# Patient Record
Sex: Male | Born: 1935 | Race: White | Hispanic: No | Marital: Married | Smoking: Former smoker
Health system: Southern US, Community
[De-identification: ages and names within clinical notes are randomized; demographics above are authoritative.]

## PROBLEM LIST (undated history)

## (undated) DIAGNOSIS — Z8739 Personal history of other diseases of the musculoskeletal system and connective tissue: Secondary | ICD-10-CM

## (undated) DIAGNOSIS — E7800 Pure hypercholesterolemia, unspecified: Secondary | ICD-10-CM

## (undated) DIAGNOSIS — M479 Spondylosis, unspecified: Secondary | ICD-10-CM

## (undated) DIAGNOSIS — I739 Peripheral vascular disease, unspecified: Secondary | ICD-10-CM

## (undated) DIAGNOSIS — Z8719 Personal history of other diseases of the digestive system: Secondary | ICD-10-CM

## (undated) DIAGNOSIS — N529 Male erectile dysfunction, unspecified: Secondary | ICD-10-CM

## (undated) DIAGNOSIS — G4730 Sleep apnea, unspecified: Secondary | ICD-10-CM

## (undated) DIAGNOSIS — E1121 Type 2 diabetes mellitus with diabetic nephropathy: Secondary | ICD-10-CM

## (undated) DIAGNOSIS — N183 Chronic kidney disease, stage 3 unspecified: Secondary | ICD-10-CM

## (undated) DIAGNOSIS — M1990 Unspecified osteoarthritis, unspecified site: Secondary | ICD-10-CM

## (undated) DIAGNOSIS — J189 Pneumonia, unspecified organism: Secondary | ICD-10-CM

## (undated) DIAGNOSIS — E119 Type 2 diabetes mellitus without complications: Secondary | ICD-10-CM

## (undated) DIAGNOSIS — I350 Nonrheumatic aortic (valve) stenosis: Secondary | ICD-10-CM

## (undated) DIAGNOSIS — H409 Unspecified glaucoma: Secondary | ICD-10-CM

## (undated) DIAGNOSIS — K635 Polyp of colon: Secondary | ICD-10-CM

## (undated) DIAGNOSIS — Z952 Presence of prosthetic heart valve: Secondary | ICD-10-CM

## (undated) DIAGNOSIS — S301XXA Contusion of abdominal wall, initial encounter: Secondary | ICD-10-CM

## (undated) DIAGNOSIS — K219 Gastro-esophageal reflux disease without esophagitis: Secondary | ICD-10-CM

## (undated) DIAGNOSIS — K8020 Calculus of gallbladder without cholecystitis without obstruction: Secondary | ICD-10-CM

## (undated) DIAGNOSIS — K5790 Diverticulosis of intestine, part unspecified, without perforation or abscess without bleeding: Secondary | ICD-10-CM

## (undated) DIAGNOSIS — I10 Essential (primary) hypertension: Secondary | ICD-10-CM

## (undated) DIAGNOSIS — K449 Diaphragmatic hernia without obstruction or gangrene: Secondary | ICD-10-CM

## (undated) DIAGNOSIS — G4733 Obstructive sleep apnea (adult) (pediatric): Secondary | ICD-10-CM

## (undated) DIAGNOSIS — Z87440 Personal history of urinary (tract) infections: Secondary | ICD-10-CM

## (undated) DIAGNOSIS — N400 Enlarged prostate without lower urinary tract symptoms: Secondary | ICD-10-CM

## (undated) HISTORY — DX: Polyp of colon: K63.5

## (undated) HISTORY — DX: Personal history of other diseases of the musculoskeletal system and connective tissue: Z87.39

## (undated) HISTORY — DX: Gastro-esophageal reflux disease without esophagitis: K21.9

## (undated) HISTORY — DX: Personal history of urinary (tract) infections: Z87.440

## (undated) HISTORY — PX: TOTAL KNEE ARTHROPLASTY: SHX125

## (undated) HISTORY — DX: Pure hypercholesterolemia, unspecified: E78.00

## (undated) HISTORY — DX: Personal history of other diseases of the digestive system: Z87.19

## (undated) HISTORY — DX: Type 2 diabetes mellitus without complications: E11.9

## (undated) HISTORY — DX: Essential (primary) hypertension: I10

## (undated) HISTORY — PX: APPENDECTOMY: SHX54

## (undated) HISTORY — DX: Diverticulosis of intestine, part unspecified, without perforation or abscess without bleeding: K57.90

## (undated) HISTORY — PX: FEMORAL-POPLITEAL BYPASS GRAFT: SHX937

## (undated) HISTORY — DX: Unspecified glaucoma: H40.9

## (undated) HISTORY — DX: Pneumonia, unspecified organism: J18.9

## (undated) HISTORY — DX: Type 2 diabetes mellitus with diabetic nephropathy: E11.21

## (undated) HISTORY — DX: Unspecified osteoarthritis, unspecified site: M19.90

## (undated) HISTORY — DX: Diaphragmatic hernia without obstruction or gangrene: K44.9

## (undated) HISTORY — DX: Nonrheumatic aortic (valve) stenosis: I35.0

## (undated) HISTORY — DX: Male erectile dysfunction, unspecified: N52.9

## (undated) HISTORY — DX: Spondylosis, unspecified: M47.9

## (undated) HISTORY — PX: VASECTOMY: SHX75

## (undated) HISTORY — DX: Benign prostatic hyperplasia without lower urinary tract symptoms: N40.0

## (undated) HISTORY — DX: Peripheral vascular disease, unspecified: I73.9

## (undated) HISTORY — DX: Obstructive sleep apnea (adult) (pediatric): G47.33

---

## 1993-06-14 HISTORY — PX: ABDOMINAL AORTIC ANEURYSM REPAIR: SUR1152

## 2000-05-02 ENCOUNTER — Ambulatory Visit (HOSPITAL_COMMUNITY): Admission: RE | Admit: 2000-05-02 | Discharge: 2000-05-02 | Payer: Self-pay | Admitting: Internal Medicine

## 2001-01-09 ENCOUNTER — Encounter: Admission: RE | Admit: 2001-01-09 | Discharge: 2001-01-09 | Payer: Self-pay | Admitting: Plastic Surgery

## 2001-01-09 ENCOUNTER — Encounter: Payer: Self-pay | Admitting: Rheumatology

## 2001-01-26 ENCOUNTER — Encounter: Admission: RE | Admit: 2001-01-26 | Discharge: 2001-01-26 | Payer: Self-pay | Admitting: Rheumatology

## 2001-01-26 ENCOUNTER — Encounter: Payer: Self-pay | Admitting: Rheumatology

## 2001-03-14 HISTORY — PX: LUMBAR SPINE SURGERY: SHX701

## 2001-04-10 ENCOUNTER — Encounter: Payer: Self-pay | Admitting: Neurosurgery

## 2001-04-12 ENCOUNTER — Encounter: Payer: Self-pay | Admitting: Neurosurgery

## 2001-04-12 ENCOUNTER — Inpatient Hospital Stay (HOSPITAL_COMMUNITY): Admission: RE | Admit: 2001-04-12 | Discharge: 2001-04-13 | Payer: Self-pay | Admitting: Neurosurgery

## 2001-06-14 HISTORY — PX: CATARACT EXTRACTION: SUR2

## 2002-06-14 HISTORY — PX: REPLACEMENT TOTAL KNEE: SUR1224

## 2002-11-16 ENCOUNTER — Ambulatory Visit (HOSPITAL_COMMUNITY): Admission: RE | Admit: 2002-11-16 | Discharge: 2002-11-16 | Payer: Self-pay | Admitting: Gastroenterology

## 2003-03-26 ENCOUNTER — Encounter: Payer: Self-pay | Admitting: Orthopedic Surgery

## 2003-04-01 ENCOUNTER — Inpatient Hospital Stay (HOSPITAL_COMMUNITY): Admission: RE | Admit: 2003-04-01 | Discharge: 2003-04-06 | Payer: Self-pay | Admitting: Orthopedic Surgery

## 2006-11-09 ENCOUNTER — Encounter: Admission: RE | Admit: 2006-11-09 | Discharge: 2006-11-09 | Payer: Self-pay | Admitting: Internal Medicine

## 2006-12-02 ENCOUNTER — Encounter: Admission: RE | Admit: 2006-12-02 | Discharge: 2006-12-02 | Payer: Self-pay | Admitting: Internal Medicine

## 2007-01-17 ENCOUNTER — Encounter: Admission: RE | Admit: 2007-01-17 | Discharge: 2007-01-17 | Payer: Self-pay | Admitting: Internal Medicine

## 2007-12-29 ENCOUNTER — Ambulatory Visit: Payer: Self-pay | Admitting: Vascular Surgery

## 2008-05-03 ENCOUNTER — Ambulatory Visit: Payer: Self-pay | Admitting: Vascular Surgery

## 2008-08-05 ENCOUNTER — Inpatient Hospital Stay (HOSPITAL_COMMUNITY): Admission: RE | Admit: 2008-08-05 | Discharge: 2008-08-12 | Payer: Self-pay | Admitting: Orthopedic Surgery

## 2008-11-08 ENCOUNTER — Encounter: Admission: RE | Admit: 2008-11-08 | Discharge: 2008-11-08 | Payer: Self-pay | Admitting: Neurological Surgery

## 2008-12-27 ENCOUNTER — Ambulatory Visit: Payer: Self-pay | Admitting: Vascular Surgery

## 2009-06-19 ENCOUNTER — Ambulatory Visit: Payer: Self-pay | Admitting: Vascular Surgery

## 2010-06-19 ENCOUNTER — Ambulatory Visit: Admit: 2010-06-19 | Payer: Self-pay | Admitting: Vascular Surgery

## 2010-07-28 ENCOUNTER — Encounter (INDEPENDENT_AMBULATORY_CARE_PROVIDER_SITE_OTHER): Payer: MEDICARE

## 2010-07-28 ENCOUNTER — Encounter (INDEPENDENT_AMBULATORY_CARE_PROVIDER_SITE_OTHER): Payer: MEDICARE | Admitting: Vascular Surgery

## 2010-07-28 DIAGNOSIS — I739 Peripheral vascular disease, unspecified: Secondary | ICD-10-CM

## 2010-07-28 DIAGNOSIS — Z48812 Encounter for surgical aftercare following surgery on the circulatory system: Secondary | ICD-10-CM

## 2010-07-28 DIAGNOSIS — I714 Abdominal aortic aneurysm, without rupture: Secondary | ICD-10-CM

## 2010-07-28 DIAGNOSIS — I70219 Atherosclerosis of native arteries of extremities with intermittent claudication, unspecified extremity: Secondary | ICD-10-CM

## 2010-07-29 NOTE — Consult Note (Signed)
NEW PATIENT CONSULTATION  [name], [name] DOB:  [birth year]                                       07/28/2010 CHART#:[id number]  Patient presents today for follow-up of his aneurysmal disease and prior repair.  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  He has been followed on noninvasive vascular labs since that time.  I had last seen him in 2002, and he is here today for follow-up.  He looks quite good.  He does have progressive obesity.  He is his usual animated self. He continues to have hypertension, elevated cholesterol, and does have non-insulin diabetes.  SOCIAL HISTORY:  He is married with 4 children.  He quit smoking 25 years ago.  FAMILY HISTORY:  Positive for a heart attack in his mother and aneurysm in his brother.  REVIEW OF SYSTEMS:  Noted for weight gain up to 292 pounds.  He is 5 feet 9 inches tall.  He does have some discomfort __________ with walking. CARDIAC:  Positive for chest pain. URINARY:  Positive for urinary frequency, difficulty in urination. ENT:  Changed eyesight. MUSCULOSKELETAL:  Arthritis.  Otherwise, review of systems is negative.  PHYSICAL EXAMINATION:  A well-developed, well-nourished, obese male in no acute stress.  Blood pressure is 119/67, pulse 85, respirations 22. HEENT:  Normal.  CHEST:  Clear bilaterally.  HEART:  Regular rate and rhythm.  His radial pulses are 2+.  He has palpable popliteal pulses bilaterally.  He does have some prominence but no easily palpable popliteal aneurysm on the right.  Abdomen is obese.  His well-healed midline incision is nontender.  Musculoskeletal shows no major deformities or cyanosis.  Neurologic:  No focal weakness or paresthesias.  Skin without ulcers or rashes.  He did undergo __________  studies in our office today, and I  reviewed this with him.  This does show somewhat of a drop in his ankle-arm index bilaterally at 0.67 on the right and 0.83 on the left.  He does have biphasic and triphasic flow with no evidence of critical ischemia.  His left above-knee to below-knee popliteal bypass looks quite good with no evidence of elevated velocities and excellent flow.  He does not have any distention of his native popliteal artery on the right, which is stable at 1.1 cm.  I reviewed all this with patient and discussed this with him.  We will continue to see him on a yearly basis for follow-up.    Larina Earthly, M.D.  TFE/MEDQ  D:  07/28/2010  T:  07/29/2010  Job:  [id number]  cc:   Barry Dienes. Eloise Harman, M.D.

## 2010-08-11 NOTE — Procedures (Unsigned)
BYPASS GRAFT EVALUATION  INDICATION:  Followup bypass graft.  HISTORY: Diabetes:  Yes. Cardiac:  Unknown. Hypertension:  Yes. Smoking:  Previous. Previous Surgery:  Left above knee to below knee popliteal artery bypass graft in 1995.  SINGLE LEVEL ARTERIAL EXAM                              RIGHT              LEFT Brachial:                    142                140 Anterior tibial:             86                 119 Posterior tibial:            94                 118 Peroneal: Ankle/brachial index:        0.67               0.83  PREVIOUS ABI:  Date:  06/19/2009  RIGHT:  1.05  LEFT:  1.07  LOWER EXTREMITY BYPASS GRAFT DUPLEX EXAM:  DUPLEX:  Biphasic Doppler waveforms noted throughout the left lower extremity bypass graft and its native vessels with an elevated velocity at the mid to distal bypass graft of 159 cm/sec. The maximum diameter of the right popliteal artery is 1.00 x 1.13 cm.  IMPRESSION:  Patent left above knee to below knee popliteal artery bypass graft with an area of elevated velocity near the distal anastomosis and/or distal bypass graft.  Declined ankle brachial indices bilaterally.  ___________________________________________ Larina Earthly, M.D.  EM/MEDQ  D:  07/28/2010  T:  07/28/2010  Job:  [id number]

## 2010-09-24 LAB — GLUCOSE, CAPILLARY: Glucose-Capillary: 142 mg/dL — ABNORMAL HIGH (ref 70–99)

## 2010-09-24 LAB — PROTIME-INR
INR: 2.2 — ABNORMAL HIGH (ref 0.00–1.49)
Prothrombin Time: 26.2 seconds — ABNORMAL HIGH (ref 11.6–15.2)

## 2010-09-29 LAB — BASIC METABOLIC PANEL
BUN: 17 mg/dL (ref 6–23)
BUN: 24 mg/dL — ABNORMAL HIGH (ref 6–23)
BUN: 28 mg/dL — ABNORMAL HIGH (ref 6–23)
BUN: 32 mg/dL — ABNORMAL HIGH (ref 6–23)
CO2: 25 mEq/L (ref 19–32)
CO2: 26 mEq/L (ref 19–32)
CO2: 26 mEq/L (ref 19–32)
CO2: 27 mEq/L (ref 19–32)
CO2: 27 mEq/L (ref 19–32)
Calcium: 8.2 mg/dL — ABNORMAL LOW (ref 8.4–10.5)
Calcium: 8.3 mg/dL — ABNORMAL LOW (ref 8.4–10.5)
Calcium: 8.5 mg/dL (ref 8.4–10.5)
Calcium: 8.8 mg/dL (ref 8.4–10.5)
Calcium: 8.8 mg/dL (ref 8.4–10.5)
Calcium: 9.1 mg/dL (ref 8.4–10.5)
Chloride: 110 mEq/L (ref 96–112)
Creatinine, Ser: 1.4 mg/dL (ref 0.4–1.5)
Creatinine, Ser: 1.44 mg/dL (ref 0.4–1.5)
Creatinine, Ser: 1.56 mg/dL — ABNORMAL HIGH (ref 0.4–1.5)
Creatinine, Ser: 1.72 mg/dL — ABNORMAL HIGH (ref 0.4–1.5)
GFR calc Af Amer: 42 mL/min — ABNORMAL LOW (ref >60)
GFR calc Af Amer: 60 mL/min — ABNORMAL LOW (ref >60)
GFR calc Af Amer: >60 mL/min (ref >60)
GFR calc non Af Amer: 35 mL/min — ABNORMAL LOW (ref >60)
GFR calc non Af Amer: 39 mL/min — ABNORMAL LOW (ref >60)
GFR calc non Af Amer: 44 mL/min — ABNORMAL LOW (ref >60)
GFR calc non Af Amer: 50 mL/min — ABNORMAL LOW (ref >60)
GFR calc non Af Amer: 58 mL/min — ABNORMAL LOW (ref >60)
Glucose, Bld: 132 mg/dL — ABNORMAL HIGH (ref 70–99)
Glucose, Bld: 141 mg/dL — ABNORMAL HIGH (ref 70–99)
Glucose, Bld: 142 mg/dL — ABNORMAL HIGH (ref 70–99)
Glucose, Bld: 163 mg/dL — ABNORMAL HIGH (ref 70–99)
Potassium: 3.9 mEq/L (ref 3.5–5.1)
Potassium: 4.5 mEq/L (ref 3.5–5.1)
Sodium: 131 mEq/L — ABNORMAL LOW (ref 135–145)
Sodium: 137 mEq/L (ref 135–145)
Sodium: 142 mEq/L (ref 135–145)

## 2010-09-29 LAB — GLUCOSE, CAPILLARY
Glucose-Capillary: 121 mg/dL — ABNORMAL HIGH (ref 70–99)
Glucose-Capillary: 121 mg/dL — ABNORMAL HIGH (ref 70–99)
Glucose-Capillary: 124 mg/dL — ABNORMAL HIGH (ref 70–99)
Glucose-Capillary: 129 mg/dL — ABNORMAL HIGH (ref 70–99)
Glucose-Capillary: 134 mg/dL — ABNORMAL HIGH (ref 70–99)
Glucose-Capillary: 135 mg/dL — ABNORMAL HIGH (ref 70–99)
Glucose-Capillary: 136 mg/dL — ABNORMAL HIGH (ref 70–99)
Glucose-Capillary: 140 mg/dL — ABNORMAL HIGH (ref 70–99)
Glucose-Capillary: 143 mg/dL — ABNORMAL HIGH (ref 70–99)
Glucose-Capillary: 143 mg/dL — ABNORMAL HIGH (ref 70–99)
Glucose-Capillary: 149 mg/dL — ABNORMAL HIGH (ref 70–99)
Glucose-Capillary: 154 mg/dL — ABNORMAL HIGH (ref 70–99)
Glucose-Capillary: 154 mg/dL — ABNORMAL HIGH (ref 70–99)
Glucose-Capillary: 158 mg/dL — ABNORMAL HIGH (ref 70–99)
Glucose-Capillary: 164 mg/dL — ABNORMAL HIGH (ref 70–99)
Glucose-Capillary: 166 mg/dL — ABNORMAL HIGH (ref 70–99)

## 2010-09-29 LAB — URINALYSIS, ROUTINE W REFLEX MICROSCOPIC
Bilirubin Urine: NEGATIVE
Glucose, UA: NEGATIVE mg/dL
Glucose, UA: NEGATIVE mg/dL
Leukocytes, UA: NEGATIVE
Nitrite: NEGATIVE
Protein, ur: NEGATIVE mg/dL
Specific Gravity, Urine: 1.019 (ref 1.005–1.030)
pH: 5.5 (ref 5.0–8.0)
pH: 5.5 (ref 5.0–8.0)

## 2010-09-29 LAB — PROTIME-INR
INR: 1.1 (ref 0.00–1.49)
INR: 1.1 (ref 0.00–1.49)
INR: 1.5 (ref 0.00–1.49)
INR: 1.7 — ABNORMAL HIGH (ref 0.00–1.49)
Prothrombin Time: 14.1 seconds (ref 11.6–15.2)
Prothrombin Time: 19.1 seconds — ABNORMAL HIGH (ref 11.6–15.2)
Prothrombin Time: 20.2 seconds — ABNORMAL HIGH (ref 11.6–15.2)
Prothrombin Time: 24.5 seconds — ABNORMAL HIGH (ref 11.6–15.2)

## 2010-09-29 LAB — TYPE AND SCREEN
ABO/RH(D): A POS
Antibody Screen: NEGATIVE

## 2010-09-29 LAB — CBC
HCT: 42 % (ref 39.0–52.0)
HCT: 28.2 % — ABNORMAL LOW (ref 39.0–52.0)
HCT: 28.5 % — ABNORMAL LOW (ref 39.0–52.0)
HCT: 35.8 % — ABNORMAL LOW (ref 39.0–52.0)
Hemoglobin: 14.2 g/dL (ref 13.0–17.0)
Hemoglobin: 10.5 g/dL — ABNORMAL LOW (ref 13.0–17.0)
Hemoglobin: 12.1 g/dL — ABNORMAL LOW (ref 13.0–17.0)
Hemoglobin: 9.6 g/dL — ABNORMAL LOW (ref 13.0–17.0)
MCHC: 33.9 g/dL (ref 30.0–36.0)
MCHC: 33.6 g/dL (ref 30.0–36.0)
MCHC: 33.9 g/dL (ref 30.0–36.0)
MCV: 94.7 fL (ref 78.0–100.0)
Platelets: 138 10*3/uL — ABNORMAL LOW (ref 150–400)
Platelets: 80 10*3/uL — ABNORMAL LOW (ref 150–400)
Platelets: 91 10*3/uL — ABNORMAL LOW (ref 150–400)
Platelets: 94 10*3/uL — ABNORMAL LOW (ref 150–400)
RBC: 4.43 MIL/uL (ref 4.22–5.81)
RBC: 2.96 MIL/uL — ABNORMAL LOW (ref 4.22–5.81)
RBC: 3.71 MIL/uL — ABNORMAL LOW (ref 4.22–5.81)
RDW: 14 % (ref 11.5–15.5)
RDW: 14.5 % (ref 11.5–15.5)
RDW: 14.5 % (ref 11.5–15.5)
RDW: 14.7 % (ref 11.5–15.5)
WBC: 4.6 10*3/uL (ref 4.0–10.5)
WBC: 6 10*3/uL (ref 4.0–10.5)

## 2010-09-29 LAB — COMPREHENSIVE METABOLIC PANEL
ALT: 21 U/L (ref 0–53)
AST: 21 U/L (ref 0–37)
Albumin: 3.9 g/dL (ref 3.5–5.2)
Alkaline Phosphatase: 83 U/L (ref 39–117)
BUN: 31 mg/dL — ABNORMAL HIGH (ref 6–23)
CO2: 25 mEq/L (ref 19–32)
Calcium: 9.2 mg/dL (ref 8.4–10.5)
Chloride: 108 mEq/L (ref 96–112)
Creatinine, Ser: 1.44 mg/dL (ref 0.4–1.5)
GFR calc Af Amer: 58 mL/min — ABNORMAL LOW (ref >60)
GFR calc non Af Amer: 48 mL/min — ABNORMAL LOW (ref >60)
Glucose, Bld: 138 mg/dL — ABNORMAL HIGH (ref 70–99)
Potassium: 4.2 mEq/L (ref 3.5–5.1)
Sodium: 142 mEq/L (ref 135–145)
Total Bilirubin: 1 mg/dL (ref 0.3–1.2)
Total Protein: 6.5 g/dL (ref 6.0–8.3)

## 2010-09-29 LAB — URINE CULTURE: Colony Count: NO GROWTH

## 2010-09-29 LAB — BRAIN NATRIURETIC PEPTIDE: Pro B Natriuretic peptide (BNP): <30 pg/mL (ref 0.0–100.0)

## 2010-09-29 LAB — URINE MICROSCOPIC-ADD ON

## 2010-09-29 LAB — ABO/RH: ABO/RH(D): A POS

## 2010-09-29 LAB — APTT: aPTT: 31 seconds (ref 24–37)

## 2010-10-27 NOTE — Discharge Summary (Signed)
NAMEMaxamilian [name], [name] [name]                  ACCOUNT NO.:  [id number]   MEDICAL RECORD NO.:  [id number]          PATIENT TYPE:  INP   LOCATION:  1603                         FACILITY:  [facility]   PHYSICIAN:  Ollen Gross, M.D.    DATE OF BIRTH:  August 26, 1931   DATE OF ADMISSION:  08/05/2008  DATE OF DISCHARGE:  08/12/2008                               DISCHARGE SUMMARY   ADMITTING DIAGNOSES:  1. Osteoarthritis, left knee.  2. Past history of pneumonia.  3. Hypertension.  4. Peripheral vascular disease.  5. History of hiatal hernia.  6. Reflux disease.  7. Past history of urinary tract infections.  8. Type 2 diabetes mellitus.  9. Back arthritis.  10.History of gout.   DISCHARGE DIAGNOSES:  1. Osteoarthritis, left knee, status post left total knee replacement      arthroplasty.  2. Intermittent postop hypoxia, resolved.  3. Mild renal insufficiency.  4. Acute blood loss anemia, did not require transfusion.  5. Past history of pneumonia.  6. Hypertension.  7. Peripheral vascular disease.  8. History of hiatal hernia.  9. Reflux disease.  10.Past history of urinary tract infections.  11.Type 2 diabetes mellitus.  12.Back arthritis.  13.History of gout.   PROCEDURE:  August 05, 2008, left total knee.  Surgeon, Dr. Lequita Halt.  Assistant, Avel Peace, P.A.-C.  Surgery under spinal with Duramorph  added.  Tourniquet time, 36 minutes.   CONSULTS:  Medical, Dr. Jarome Matin.   BRIEF HISTORY:  [name] [name] is a 77 year old male who has been seen by  Dr. Lequita Halt for ongoing left knee arthritis that has been intractable  with conservative measures, worsening pain and dysfunction, now presents  for total knee.   LABORATORY DATA:  CBC on admission, hemoglobin of 14.2, hematocrit of  42.0, white cell count 4.6, platelets 138.  PT/INR 14.1 and 1.1 with a  PTT of 31.  Chem panel on admission, slightly elevated BUN of 31,  elevated glucose of 138, known diabetic.  Remaining Chem panel within  normal limits.  Preop UA was negative.  Serial CBCs were followed.  Hemoglobin dropped down to 12.1 postop, then 10.5, then 10.1.  Last  noted H and H 9.6 and 28.5 crit.  Serial pro times followed per Coumadin  protocol.  Last noted PT/INR 26.2 and 2.2.  Serial BMETs were followed.  Sodium did drop from 142 to 131, back up to 142.  Creatinine started at  1.4, did go up to 1.9, back down to 1.56, last noted back down to 1.4.  Glucose was 138, went up to 163, back down to 135.   X-RAYS:  Chest x-ray dated August 07, 2008, mild left lower lobe  atelectasis, 2-cm mass projected over the right lower lung worrisome for  carcinoma.  Please note the patient had a known history of the lung  nodule.  Dr. Eloise Harman also noted in his progress note.  They had not  been compared to previous x-rays of 2002 and 2004 that also showed the  nodule so this was not new finding.   EKG:  July 29, 2008, normal sinus  rhythm, minimal voltage criteria  for LVH, no change since last tracing per Dr. Chanda Busing.  EKG  August 07, 2008, sinus tachycardia, minimal voltage criteria for LVH,  unconfirmed.   HOSPITAL COURSE:  Patient admitted to [facility], taken to the  OR, underwent above-stated procedure without complication.  Patient  tolerated procedure well, later transferred to recovery room on  orthopedic floor.  Started on PCA and p.o. analgesic pain control  following surgery.  Had some itching through the night which is felt to  be due to the Duramorph which was added to the spinal.  He was otherwise  doing fairly well.  On postop day 1, output was adequate but a little on  the lower side.  He had a past history of urinary tract infection so we  did recheck the UA prior to removing his Foley.  On day 2, encouraged  pulmonary toilet because of his history of pneumonia.  His Actos was  held initially until he was eating well with more of a normal diet.  He  was placed on sliding scale for  his diabetes.  His meds were renewed.  He was placed on Coumadin for DVT prophylaxis.  Did have a history of  peripheral vascular disease.  Also, history of gout which he used  allopurinol.  He was placed on his omeprazole.  Dr. Eloise Harman came by on  postop day 2 as a courtesy, did check on the patient.  He was doing  pretty well at that time.  He started getting up and he was just doing  short ambulation about 10 to 20 feet.  After discussion, patient did  possibly want to look into a skilled nursing facility so we got social  work involved for them to follow along and help make arrangements.  Later on that postop day 2, unfortunately he had some drop in some  saturations so we got a chest x-ray which did show some lower lobe  atelectasis.  It also showed that 2-cm mass which again was not new.  EKG was ordered which just showed a little bit of sinus tach.  He was  given supplemental oxygen and the hypoxia was intermittent.  By day 3,  Dr. Eloise Harman reevaluated the patient and felt that the postop hypoxia  was likely due to probably sleep apnea versus medications, doubt for any  other ongoings.  His blood pressure meds were held until his systolic  pressure was back up.  He was running a little low pressure on the  morning of day 3.  His O2 saturations were back up with supplemental  oxygen.  His incision looked good.  His hemoglobin was down to 9.6.  He  added iron and we rechecked it.  During this time when he had the lower  pressures and hypoxia, it was noted that his creatinine had bumped up a  little bit from 1.2 to 1.4.  It got to its highest at 1.9 but it did  turn back around, came back down to 1.4.  The hypoxia did resolve and  his blood pressure was more stable.  I felt that the renal insufficiency  was just transient due to his pressure and the Benicar.  His renal  function improved.  The hemoglobin was rechecked and he was still  remaining at 9.6 for 2 days.  Felt that his  postop blood loss anemia was  stable at this time and he is asymptomatic.  By postop day #4, pressure  was stable and he was not having any symptoms when he was up walking.  He was kept through the weekend and on Saturday and Sunday he did pretty  well, up sitting in the chair, slowly progressing.  No other problems  with shortness of breath or hypoxia.  The renal insufficiency continued  to improve.  Social worker was trying to assist with placement of the  patient and he wanted to possibly look at the [employer] at  [employer] over the weekend.  Chart was noted they would contact  first thing Monday morning,  he was seen on rounds on Monday morning on  August 12, 2008.  Patient is doing well, no complaints, ready for  discharge pending bed availability.   DISCHARGE PLAN:  1. Tentative transfer to skilled nursing facility pending bed      availability.  2. Discharge diagnosis:  Please see above.   DISCHARGE MEDS:  Current medications at time of transfer include:  1. Colace 100 mg p.o. b.i.d.  2. Coumadin protocol.  Please titrate the Coumadin level for a target      INR between 2.0 and 3.0.  Needs to be on Coumadin for a total of 3      weeks from the date of surgery of August 05, 2008.  3. Actos 15 mg p.o. daily.  4. Allopurinol 150 mg daily.  5. Lasix 20 mg daily.  6. Zocor 80 mg q.h.s.  7. Omeprazole 20 mg every other day.  8. He is also taking MiraLax 17 g powder in 8 ounce of water or juice      daily.  9. Senokot-S p.o. daily, hold the Senokot and MiraLax if loose stools      and diarrhea.  10.He was placed on K-Dur 20 mEq p.o. daily.  11.Nu-Iron 150 mg p.o. daily.  12.Percocet 5 mg 1 or 2 every 4 to 6 hours as needed for moderate      pain.  13.Tylenol 325 1 or 2 every 4 to 6 hours as needed for mild pain.  14.Robaxin 500 mg p.o. every 6 to 8 hours p.r.n. spasm.  15.Atarax 25 mg p.o. every 6 hours p.r.n. itching.   DIET:  Heart-healthy, diabetic diet.    ACTIVITY:  Total knee protocol.  Weightbearing as tolerated.  PT and OT  for gait training ambulation, ADLs, range of motion, and strengthening  exercises for the left total knee.  He may start showering, however, do  not submerge the incision under water.   FOLLOWUP:  Patient needs to follow up with Dr. Ollen Gross at the  [facility] on Tuesday,  August 20, 2008, or Thursday, August 22, 2008.  Please contact the office  at [phone] to arrange appointment time and followup for this patient.   DISPOSITION:  Pending bed availability.   CONDITION UPON DISCHARGE:  Improving.   COUMADIN REGIMEN:  INR on admission was 1.1 on the date of surgery.  He  was given a 7.5-mg tablet.  On postop day 1, INR was 1.1.  He was given  a 7.5-mg tablet.  On postop day 2, his INR went up to 1.5.  He was given  a 5-mg tablet.  On postop day 3, INR was 1.6.  He was given a 6-mg  tablet.  On postop day 4, his INR was 1.7.  He was given a 6-mg tablet.  On postop day 5, his INR up to 1.8.  He was given a  7.5-mg tablet.  On  postop day 6, his INR was 2.1.  He was given a 7.5-mg tablet and on the  date of discharge his INR was 2.2.      Alexzandrew L. Perkins, P.A.C.      Ollen Gross, M.D.  Electronically Signed    ALP/MEDQ  D:  08/12/2008  T:  08/12/2008  Job:  [id number]   cc:   Ollen Gross, M.D.  Fax: [phone]   Barry Dienes. Eloise Harman, M.D.  Fax: [phone]

## 2010-10-27 NOTE — Procedures (Signed)
BYPASS GRAFT EVALUATION   INDICATION:  Followup left lower extremity bypass graft and right  popliteal artery.   HISTORY:  Diabetes:  Yes.  Cardiac:  No.  Hypertension:  Yes.  Smoking:  No.  Previous Surgery:  Left above knee to below knee popliteal artery bypass  graft in 1995.   SINGLE LEVEL ARTERIAL EXAM                               RIGHT              LEFT  Brachial:                    147                142  Anterior tibial:             128                138  Posterior tibial:            154                148  Peroneal:  Ankle/brachial index:        1.05               1.01   PREVIOUS ABI:  Date:  12/27/2008  RIGHT:  0.96  LEFT:  0.92   LOWER EXTREMITY BYPASS GRAFT DUPLEX EXAM:   DUPLEX:  Triphasic Doppler waveforms noted throughout the left lower  extremity bypass graft and its native vessels with no evidence of  stenosis.  The maximum diameter of the right popliteal artery is 1.0 x 1.1 cm.   IMPRESSION:  1. Patent left above knee to below knee popliteal artery bypass graft      with no evidence of stenosis.  2. Stable bilateral ankle brachial indices when compared to the      previous studies.        ___________________________________________  Larina Earthly, M.D.   CH/MEDQ  D:  06/20/2009  T:  06/21/2009  Job:  [id number]

## 2010-10-27 NOTE — Procedures (Signed)
BYPASS GRAFT EVALUATION   INDICATION:  Followup, left lower extremity bypass graft.   HISTORY:  Diabetes:  Yes.  Cardiac:  No.  Hypertension:  Yes.  Smoking:  No.  Previous Surgery:  Left above-knee popliteal to below-knee popliteal  artery bypass graft on 01/01/94.   SINGLE LEVEL ARTERIAL EXAM                               RIGHT              LEFT  Brachial:                    142                142  Anterior tibial:             134                132  Posterior tibial:            138                138  Peroneal:  Ankle/brachial index:        0.98               0.98   PREVIOUS ABI:  Date: 01/06/06  RIGHT:  >1.0  LEFT:  >1.0   LOWER EXTREMITY BYPASS GRAFT DUPLEX EXAM:   DUPLEX:  1. Triphasic Doppler waveforms noted throughout the left lower      extremity bypass graft with no increase in velocities noted.  2. The right popliteal artery diameter measurements are 0.96 X 1.1 cm.   IMPRESSION:  1. Patent left popliteal artery bypass graft with no evidence of      stenosis noted.  2. The bilateral ankle brachial indices and right popliteal artery      measurements are stable from the previous examination on 01/06/06.   ___________________________________________  Larina Earthly, M.D.   CH/MEDQ  D:  12/29/2007  T:  12/29/2007  Job:  [id number]

## 2010-10-27 NOTE — H&P (Signed)
NAMEMaxey [name], [name] [name]                  ACCOUNT NO.:  [id number]   MEDICAL RECORD NO.:  [id number]          PATIENT TYPE:  INP   LOCATION:  NA                           FACILITY:  [facility]   PHYSICIAN:  Ollen Gross, M.D.    DATE OF BIRTH:  04-28-1932   DATE OF ADMISSION:  08/05/2008  DATE OF DISCHARGE:                              HISTORY & PHYSICAL   Date of office visit, history and physical July 25, 2008.   CHIEF COMPLAINT:  Left knee pain.   HISTORY OF PRESENT ILLNESS:  The patient is a 77 year old male who has  been seen by Dr. Lequita Halt for ongoing right knee pain.  She had a  previous right total knee back in 2004, and unfortunately the left knee  has continued to be problematic.  He has been treated conservatively in  the past utilizing cortisone and viscous supplementation.  Despite the  conservative measures he has gone on to continued pain.  He has reached  the point where he would like to have something done about it.  Risks  and benefits have been discussed.  He elects to proceed with surgery.   ALLERGIES:  1. TENORMIN causes asthma-like symptoms and breathing difficulty.  2. INDOCIN causes black out.   CURRENT MEDICATIONS:  1. Simvastatin 80 mg daily.  2. Allopurinol 150 mg daily.  3. Actos 15 mg daily.  4. Micardis 80 mg daily.  5. Meloxicam 15 mg daily.  6. Omeprazole 20 mg every other day.  7. CoQ10, a hundred milligrams 1-2 per day.  8. Also baby aspirin daily.   PAST MEDICAL HISTORY:  1. History of pneumonia.  2. Hypertension.  3. Peripheral vascular disease.  4. Remote history of hiatal hernia and reflux.  5. Past history of urinary tract infections.  6. Type 2 diabetes.  7. Back arthritis.  8. History of gout.   PAST SURGICAL HISTORY:  1. Appendix 1952.  2. Right total knee replacement 2004.  3. Bilateral cataract procedures.  4. Lumbar procedure.  5. Adenoid surgery.  6. Surgical repair of aneurysm in the left leg 1995.  7. Aortic aneurysmal  repair in 1995 (the patient still has aneurysm in      the right leg).   SOCIAL HISTORY:  Married, retired Acupuncturist.  Past smoker,  quit about 20 years ago.  Seldom, rare intake of alcohol.  Four  children.  His wife will be assisting with care after surgery.  Lives in  a two-story home with 2 steps entering.  Does have a living will.   FAMILY HISTORY:  Mother with history of hypertension, heart disease, and  bleeding disorders.  Father with hypertension and artery disease.  Sister with pancreatic cancer.  A brother with hypertension.  Aunt with  diabetes.   REVIEW OF SYSTEMS:  GENERAL:  No fevers, chills or night sweats.  NEUROLOGICAL:  No seizures, syncope or paralysis.  RESPIRATORY:  No  shortness of breath, productive cough or hemoptysis.  CARDIOVASCULAR:  No chest pain, angina, orthopnea.  GI:  No nausea, vomiting, diarrhea,  constipation.  GU:  A  little bit of nocturia.  No dysuria, hematuria.  MUSCULOSKELETAL:  Left knee.   PHYSICAL EXAMINATION:  VITAL SIGNS:  Pulse 92, respirations 12, blood  pressure 130/74.  GENERAL:  A 77 year old white male well-nourished, well-developed in no  acute distress.  He is alert, oriented, and cooperative, good historian.  HEENT:  Normocephalic, atraumatic.  Pupils are round and reactive.  EOMs  intact.  NECK:  Supple.  CHEST:  Clear.  HEART:  Regular rate and rhythm with a grade 3/6 systolic ejection  murmur best heard over the aortic point.  ABDOMEN:  Soft, large,  protuberant abdomen.  Bowel sounds present.  RECTAL, BREASTS, GENITALIA:  Not done and not pertinent to present  illness.  EXTREMITIES:  Left knee slight varus malalignment deformity.  Range of  motion 10-120, no instability.   IMPRESSION:  Osteoarthritis of the left knee.   PLAN:  The patient will be admitted to [facility] to undergo a  left total knee replacement arthroplasty.  Surgery will be performed by  Dr. Ollen Gross.      Alexzandrew L.  Perkins, P.A.C.      Ollen Gross, M.D.  Electronically Signed    ALP/MEDQ  D:  08/04/2008  T:  08/05/2008  Job:  [id number]   cc:   Barry Dienes. Eloise Harman, M.D.  Fax: [phone]

## 2010-10-27 NOTE — Procedures (Signed)
BYPASS GRAFT EVALUATION   INDICATION:  Follow-up evaluation of lower extremity bypass graft.   HISTORY:  Diabetes:  Yes.  Cardiac:  No.  Hypertension:  Yes.  Smoking:  No.  Previous Surgery:  Left above-knee popliteal-to-below-knee popliteal  artery bypass graft on 01/01/94.   SINGLE LEVEL ARTERIAL EXAM                               RIGHT              LEFT  Brachial:                    133                123  Anterior tibial:             116                116  Posterior tibial:            128                122  Peroneal:  Ankle/brachial index:        0.96               0.92   PREVIOUS ABI:  Date:  12/29/07  RIGHT:  0.98  LEFT:  0.98   LOWER EXTREMITY BYPASS GRAFT DUPLEX EXAM:   DUPLEX:  1. Triphasic duplex waveforms noted within left lower extremity bypass      graft and native arteries.  2. The right popliteal artery diameter measures 1.06 X 1.25 cm.   IMPRESSION:  1. Patent left popliteal artery bypass graft with no evidence of focal      stenosis.  2. Normal right lower extremity ankle brachial index with triphasic      Doppler waveforms.  3. Left lower extremity ankle brachial index suggests mild arterial      disease with triphasic Doppler waveforms.   ___________________________________________  Larina Earthly, M.D.   AC/MEDQ  D:  12/30/2008  T:  12/30/2008  Job:  [id number]

## 2010-10-27 NOTE — Op Note (Signed)
[name] [name], [name] [name]                  ACCOUNT NO.:  [id number]   MEDICAL RECORD NO.:  [id number]          PATIENT TYPE:  INP   LOCATION:  0001                         FACILITY:  [facility]   PHYSICIAN:  Ollen Gross, M.D.    DATE OF BIRTH:  Mar 30, 1932   DATE OF PROCEDURE:  08/05/2008  DATE OF DISCHARGE:                               OPERATIVE REPORT   PREOPERATIVE DIAGNOSIS:  Osteoarthritis, left knee.   POSTOPERATIVE DIAGNOSIS:  Osteoarthritis, left knee.   PROCEDURE:  Left total knee arthroplasty.   SURGEON:  Ollen Gross, M.D.   ASSISTANT:  Avel Peace PA-C.   ANESTHESIA:  Spinal with Duramorph.   ESTIMATED BLOOD LOSS:  Minimal.   DRAIN:  None.   TOURNIQUET TIME:  36 minutes at 300 mmHg.   COMPLICATIONS:  None.   CONDITION:  Stable to recovery.   BRIEF CLINICAL NOTE:  [name] [name] is a 77 year old male with end-stage  arthritis of the left knee with progressively worsening pain and  dysfunction.  He has failed nonoperative management and presents for  total knee arthroplasty.   PROCEDURE IN DETAIL:  After successful administration of spinal  anesthetic, a tourniquet was placed on the left thigh and left lower  extremity prepped and draped in the usual sterile fashion.  The  extremity was wrapped in Esmarch, knee flexed, tourniquet inflated to  300 mmHg.  Midline incision was made with a 10 blade through  subcutaneous tissue to the level of the extensor mechanism.  A fresh  blade was used to make a medial parapatellar arthrotomy.  Soft tissue of  the proximal medial tibia subperiosteally elevated to the joint line  with the knife and into the semimembranosus bursa with a Cobb elevator.  Soft tissue laterally was elevated, with attention being paid to avoid  the patellar tendon on the tibial tubercle.  The patella subluxed  laterally, knee flexed 90 degrees, and ACL and PCL removed.  Drill was  used to create a starting hole in the distal femur and the canal was  thoroughly irrigated.  The 5-degree left valgus alignment guide was  placed and  referencing off the posterior condyles.  Rotations marked  and a block pinned to remove 11 mm off the distal femur.  I took the 11  because of preop flexion contracture.  Distal femoral resection is made  with an oscillating saw.  Sizing block was placed, size 5 is most  appropriate.  Rotations marked off the epicondylar axis.  The size 5  cutting block is placed and the anterior, posterior and chamfer cuts are  made.   Tibia was subluxed forward and menisci removed.  Extramedullary tibial  alignment guide is placed, referencing proximally at the medial aspect  of the tibial tubercle and distally along the second metatarsal axis and  tibial crest.  The block was pinned to remove about 10 mm off the non-  deficient lateral side.  Tibial resection is made with an oscillating  saw.  Size 4 is the most appropriate tibial component and the proximal  tibia is prepared with the modular drill and  keel punch for the size 4.  Femoral preparation is completed with he intercondylar cut for the size  5.   Size 4 mobile bearing tibial trial, size 5 posterior stabilized femoral  trial, and a 10-mm posterior stabilized rotating platform insert trial  are placed.  With the 10, full extension is achieved with excellent  varus, valgus anterior and posterior balance throughout full range of  motion.  The patella was then everted and thickness measured to be 24  mm.  Freehand resection was taken to 13 mm, 41 template is placed, lug  holes were drilled, trial patella was placed and it tracks normally.  Osteophytes removed off the posterior femur with the trial in place.  All trials were removed and the cut bone surfaces prepared with  pulsatile lavage.  Cement was mixed and once ready for implantation the  size 4 mobile bearing tibial tray, size 5 posterior stabilized femur and  41 patella are cemented into place and the patella  was held with the  clamp.  Trial 10-mm insert is placed, knee held in full extension, and  all extruded cement removed.  When the cement is fully hardened, then  the permanent 10-mm posterior stabilized rotating platform insert is  placed into the tibial tray.  The wound was copiously irrigated with  saline solution and FloSeal injected on the posterior capsule, medial  and lateral gutters and suprapatellar area.  Moist sponge is placed and  tourniquet released for a total time of 36 minutes.  The sponge is held  for 2 minutes and then removed.  Minimal bleeding was encountered.  The  bleeding that is encountered is stopped with electrocautery.  The wound  was copiously irrigated with saline solution and then the arthrotomy  closed with interrupted #1 PDS.  Flexion against gravity is about 140  degrees.  Subcu is closed with interrupted 2-0 Vicryl and subcuticular  running 4-0 Monocryl.  The incision is cleaned and dried and Steri-  Strips and a bulky sterile dressing applied.  He is then placed into a  knee immobilizer, awakened, and transported to recovery in stable  condition.      Ollen Gross, M.D.  Electronically Signed     FA/MEDQ  D:  08/05/2008  T:  08/05/2008  Job:  [id number]

## 2010-10-27 NOTE — Procedures (Signed)
DUPLEX DEEP VENOUS EXAM - LOWER EXTREMITY   INDICATION:  Left lower extremity swelling.   HISTORY:  Edema:  Left.  Trauma/Surgery:  Drained fluid out of left lower extremity today.  Pain:  Left lower extremity.  PE:  No.  Previous DVT:  No.  Anticoagulants:  No.  Other:   DUPLEX EXAM:                CFV   SFV   PopV  PTV    GSV                R  L  R  L  R  L  R   L  R  L  Thrombosis    0  0     0     0      0     0  Spontaneous   +  +     +     +      +     +  Phasic        +  +     +     +      +     +  Augmentation  +  +     +     +      +     +  Compressible  +  +     +     +      +     +  Competent   Legend:  + - yes  o - no  p - partial  D - decreased   IMPRESSION:  1. No evidence of DVT or SVT in the left lower extremity or the right      common femoral vein.  2. Incidental note:  Normal and triphasic waveforms noted in the right      posterior tibial artery.    _____________________________  Larina Earthly, M.D.   AS/MEDQ  D:  05/03/2008  T:  05/03/2008  Job:  [id number]

## 2010-10-30 NOTE — H&P (Signed)
NAMEDeland [name], [name] [name]                              ACCOUNT NO.:  [id number]   MEDICAL RECORD NO.:  [id number]                   PATIENT TYPE:  INP   LOCATION:  NA                                   FACILITY:  [facility]   PHYSICIAN:  Ollen Gross, M.D.                 DATE OF BIRTH:  [birth year]   DATE OF ADMISSION:  DATE OF DISCHARGE:                                HISTORY & PHYSICAL   CHIEF COMPLAINT:  Right knee pain.   HISTORY OF PRESENT ILLNESS:  The patient is a 77 year old male who has been  seen by Dr. Homero Fellers Aluisio for ongoing right knee pain.  It has been  progressive over the past several months.  He has been treated in the past  at [facility] and received Hyalgan injections which did not help with the pain.  He states his wife wanted him to pursue knee replacement.  He was interested  but also has a severe fear of getting blood clots.  He does have a twin  brother who has recently undergone a knee replacement in another state and  developed a DVT and subsequent PE and has been on Coumadin since that time.  He has a real concern about this.  He has had previous two arthroscopic  procedures on his opposite knee and never had any problems with thrombosis.  The patient does state although he did not have any problems with his knee  scopes that he is quite concerned because he and his brother are identical  twins and he states that his fear is that he and his brother are so much  alike that they have developed similar problems.  He is seen in the office  where x-rays show bone on bone changes in the medial compartment with  enlarged osteophyte formation as well as bone on bone changes in the  patellofemoral compartment.  He has reached a point where he would benefit  from undergoing total knee replacement.  Risks and benefits of this  procedure have been discussed with the patient at length and he elects to  proceed with surgery.  There is also a concern of Mr. Meder that he wishes  to go  to rehab or some type of inpatient unit following his acute stay.  He  states his wife's health is not at a point where she would be very helpful  and he wants to make sure that he maximizes his therapy and is fully  independent before going home.  Will look into rehab for him while he is in  the hospital.   ALLERGIES:  1. INDOCIN.  2. TENORMIN.   CURRENT MEDICATIONS:  1. Gemfibrozil 600 mg 1-2 a day.  2. Allopurinol 150 mg daily.  3. Accupril 20 mg daily.  4. Lipitor 10 mg daily.  5. Celebrex 200 mg every other day as needed.  6. Actos 15 mg  daily.  7. Prevacid 30 mg as needed.  8. Quinine sulfate 5 g when needed.  9. Aspirin every few days.  10.      Glucosamine chondroitin.   PAST MEDICAL HISTORY:  1. History of pneumonia.  2. Hypertension.  3. Coronary arterial disease.  4. Peripheral vascular disease.  5. 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  He states     the aorta aneurysm has been repaired, but he still has one in the right     leg.  6. Possible remote history of hiatal hernia with reflux.  7. History of urinary tract infection.  8. Type 2 diabetes.  9. History of back arthritis.   PAST SURGICAL HISTORY:  1. Surgical repair of an aneurysm in the left leg.  2. Surgical repair of an aortic aneurysm.  3. Back surgery, 2002.  4. Appendectomy.  5. Adenoid surgery.  6. Left eye and right eye cataract surgery.  7. He has undergone two arthroscopic procedures on the left knee which he     did not have any problems with thrombosis.   SOCIAL HISTORY:  Married, retired.  Quit smoking over 20 years ago.  Seldom  intake of alcohol, probably one beer every couple of months.  He has four  children.  His wife will be with him after surgery but the patient does  state that his wife's health is to the point where she would not be able to  provide maximum assistance and wants to look into rehab.  He lives in a two  story home with 13 steps on the flight  of stairs.   FAMILY HISTORY:  Mother with a history of hypertension, heart disease, and  bleeding disorders.  Father with a history of hypertension.  Sister with a  history of pancreatic cancer.  Brother with a history of hypertension.  Aunt  with a history of diabetes.   REVIEW OF SYSTEMS:  GENERAL:  No fevers, chills, night sweats.  NEUROLOGIC:  No seizures, syncope, or paralysis.  RESPIRATORY:  No shortness of breath,  productive cough, or hemoptysis.  CARDIOVASCULAR:  No chest pain, angina, or  orthopnea.  GASTROINTESTINAL:  No nausea, vomiting, diarrhea, or  constipation.  GENITOURINARY:  Does have some nocturia a couple of times a  night.  No dysuria or hematuria.  MUSCULOSKELETAL:  Pertinent to that of the  right knee found in the history of present illness.   PHYSICAL EXAMINATION:  VITAL SIGNS:  Pulse 76, respirations 14, blood  pressure 134/78.  GENERAL:  The patient is a 77 year old white male, well-nourished, well-  developed, overweight, no acute distress.  He is accompanied by his wife.  HEENT:  Normocephalic, atraumatic.  Pupils round and reactive.  Oropharynx  clear.  EOMs are intact.  NECK:  Supple.  CHEST:  Clear to auscultation, anterior-posterior chest walls.  HEART:  Regular rate and rhythm with a 2-3/6 systolic ejection murmur best  heard at the aortic point.  ABDOMEN:  Soft, round, protuberant abdomen.  Bowel sounds are present.  RECTAL/BREASTS/GENITALIA:  Not done.  Not pertinent to present illness.  EXTREMITIES:  Significant to the right knee.  He lacks about five degrees of  full extension, flexion up to 95 degrees.  There is no instability.  Trace  edema on the right leg.  He does ambulate with an antalgic gait.   IMPRESSION:  1. Osteoarthritis, right knee.  2. Hypertension.  3. Coronary arterial disease.  4. Peripheral vascular disease.  5. History of urinary tract infections.  6. Type 2 diabetes.  7. Lumbar arthritis.  PLAN:  The patient will be  admitted to [facility] to undergo a  right total knee arthroplasty.  Surgery will be performed by Dr. Ollen Gross.  His medical doctor is Dr. Jarome Matin.  Dr. Eloise Harman will be  notified of the room number and admission and will be consulted for medical  assistance with this patient throughout the hospital course.        [name], P.A.              Ollen Gross, M.D.    ALP/MEDQ  D:  03/31/2003  T:  03/31/2003  Job:  [id number]   cc:   Barry Dienes. Eloise Harman, M.D.  [address]  [place]  [place] [address]  Fax: [phone]   Larina Earthly, M.D.  [address]  [place]  [place] [address]   Ollen Gross, M.D.  [address]  [place]  [place] [address]  Fax: [phone]

## 2010-10-30 NOTE — Discharge Summary (Signed)
NAMEAnson [name], [name] [name]                              ACCOUNT NO.:  [id number]   MEDICAL RECORD NO.:  [id number]                   PATIENT TYPE:  INP   LOCATION:  0460                                 FACILITY:  [facility]   PHYSICIAN:  Ollen Gross, M.D.                 DATE OF BIRTH:  06-Jul-1931   DATE OF ADMISSION:  04/01/2003  DATE OF DISCHARGE:  04/06/2003                                 DISCHARGE SUMMARY   ADMISSION DIAGNOSES:  1. Osteoarthritis, right knee.  2. Hypertension.  3. Coronary arterial disease.  4. Peripheral vascular disease.  5. History of urinary tract infections.  6. Type 2 diabetes.  7. Lumbar arthritis.   DISCHARGE DIAGNOSES:  1. Osteoarthritis, right knee, status post right total knee arthroplasty.  2. Postoperative blood loss anemia.  3. Hypertension.  4. Coronary arterial disease.  5. Peripheral vascular disease.  6. History of urinary tract infections.  7. Type 2 diabetes.  8. Lumbar arthritis.   OPERATIVE PROCEDURE:  On April 01, 2003, right total knee arthroplasty.  Surgeon was Dr. Ollen Gross, assistant [name] [name], P.A.-C.  Anesthesia  was spinal.  Minimal blood loss.  Hemovac drain x1.  Tourniquet time 39  minutes at 300 mmHg.   CONSULTATIONS:  Rehabilitation services, Dr. Riley Kill.   BRIEF HISTORY:  The patient is a 77 year old male who has been seen by Dr.  Despina Hick, found to have severe end-stage osteoarthritis of the right knee.  The pain is progressively getting worse.  It has been refractory to  nonoperative management.  It is felt he would benefit from undergoing total  knee replacement.  Risks and benefits have been discussed.  The patient has  elected to proceed with surgery.   LABORATORY DATA:  CBC on admission:  Hemoglobin 14.5, hematocrit 42.6, white  cell count 5, red cell count 4.53.  Differential all within normal limits.  Postoperative H&H 12.6 and 36.7, last noted H&H were 10.7 and 30.7.  PT and  PTT on admission were 13.2 and  22, respectively, INR of 1.  Serial  prothrombin times followed.  Last noted PT and INR 20.8 and 2.3.  Chem panel  on admission:  Elevated BUN of 32, remaining chem panel all within normal  limits.  Serial BMET's were followed.  BUN came down to a normal level of  18, electrolytes remained within normal limits.  Urinalysis on admission was  negative.  Blood group type A positive.   EKG dated March 26, 2003:  Normal sinus rhythm, normal EKG, no significant  change since last tracing performed by Dr. Cassell Clement.  Chest x-ray  dated March 26, 2003, probable old granulomatous disease;  however,  stability when comparison to chest x-ray two years or older or chest CT is  advised for further confirmation of these findings.  Chest when compared to  previous outside films from [place], stable 2.4 cm  granuloma right lower  lobe, no evidence of active disease in the chest.  No significant change  blunting or scarring, left costophrenic angle.   HOSPITAL COURSE:  The patient was admitted to [facility], taken to  the OR, underwent the above stated procedure without complications.  The  patient tolerated the procedure well, later transferred to the recovery room  and then to the orthopaedic floor for continued postoperative care.  Vital  signs were followed.  The patient was placed on Coumadin for DVT prophylaxis  for four weeks.  Also placed on Lovenox protocol for coverage until the  Coumadin was therapeutic.  Given 24 hours of postoperative IV antibiotics,  placed on an ADA 2100 calorie diet.  Placed weightbearing as tolerated to  the right lower extremity.  PT and OT were consulted to assist with gait  training, ambulating, and ADL's.  Rehab consult was called.  The patient was  seen in consultation by Dr. Riley Kill postoperatively and felt the patient may  benefit from undergoing a brief inpatient rehab stay prior to going home.  It was decided the patient would be  transferred to rehab when a bed became  available.  Hemovac drain placed at the time of surgery was pulled on  postoperative day #1.  By day #2, the patient was doing a little better, was  weaned over to p.o. medications, the PCA was discontinued, the Foley had  already been removed.  Dressing changes initiated on postoperative day #2,  incision was healing well.  From a therapy standpoint, the patient started  to progress with PT, up 30 feet, ambulating on day #2, and then 100 feet by  day #3, and then even 200 feet by day #4.  The patient did so well with  physical therapy that it was felt he may not need inpatient stay.  Continued  to progress well.  The IV was discontinued by day #3.  The patient was seen  as a courtesy by Dr. Jarome Matin, his medical doctor.  It was noted that  his blood pressure had been running a little on the lower side, so the  Accupril was held.  It was noted by therapy that the patient was doing too  well to go to rehab, therefore, discharge planning switched gears and  started setting up post-hospital care.  By April 06, 2003, the patient was  a little anxious about going home.  His pain was well under control.  He was  already ambulating 200 feet.  Reassurance was provided.  Arrangements had  been made for home care.  It was decided the patient could be discharged  home at that time.   DISCHARGE PLAN:  The patient was discharged home on April 06, 2003.   DISCHARGE DIAGNOSES:  Please see above.   DISCHARGE MEDICATIONS:  1. Percocet #60.  2. Robaxin #30.  3. Coumadin per protocol.   ACTIVITY:  Weightbearing as tolerated.  Home health PT, home health nursing  through [facility].   FOLLOWUP:  Two weeks from surgery.   DISPOSITION:  Home.   CONDITION ON DISCHARGE:  Improved.     [name] [name], P.A.              Ollen Gross, M.D.    ALP/MEDQ  D:  04/26/2003  T:  04/26/2003  Job:  [id number]  cc:   Barry Dienes. Eloise Harman, M.D.   [address]  [place]  [place] [address]  Fax: [phone]   Tawanna Cooler  Shirline Frees, M.D.  [address]  [place]  [place] [address]   Ranelle Oyster, M.D.  [address] [place]  [place] [address]  Fax: [phone]

## 2010-10-30 NOTE — Op Note (Signed)
NAMEMonique [name], [name] [name]                              ACCOUNT NO.:  [id number]   MEDICAL RECORD NO.:  [id number]                   PATIENT TYPE:  INP   LOCATION:  0004                                 FACILITY:  [facility]   PHYSICIAN:  Ollen Gross, M.D.                 DATE OF BIRTH:  [birth year]   DATE OF PROCEDURE:  04/01/2003  DATE OF DISCHARGE:                                 OPERATIVE REPORT   PREOPERATIVE DIAGNOSIS:  Osteoarthritis, right knee.   POSTOPERATIVE DIAGNOSIS:  Osteoarthritis, right knee.   OPERATION:  Right total knee arthroplasty.   SURGEON:  Ollen Gross, M.D.   ASSISTANT:  [name], P.A.   ANESTHESIA:  Spinal.   ESTIMATED BLOOD LOSS:  Minimal.   DRAINS:  Hemovac x1.   COMPLICATIONS:  None.   TOURNIQUET TIME:  39 minutes at 300 mmHg.   CONDITION:  Stable to recovery.   BRIEF CLINICAL NOTE:  [name] [name] is a 77 year old male with severe end-stage  osteoarthritis of the right knee with pain refractory to nonoperative  management.  He presents now for right total knee arthroplasty.   PROCEDURE IN DETAIL:  After successful administration of a spinal  anesthetic, a tourniquet is placed high in the right thigh and the right  lower extremity prepped and draped in the usual sterile fashion.  The  extremity was wrapped in an Esmarch, the knee flexed, and the tourniquet  inflated to 300 mmHg.  A standard midline is made with a 10 blade through  the subcutaneous tissue to the level of the extensor mechanism, then a fresh  blade is used to make a medial parapatellar arthrotomy.  The soft tissue  over the proximal medial tibia is subperiosteally elevated to the joint line  with the knife and to the semimembranosus bursa with a curved osteotome.  Soft tissue over the proximal lateral tibia was also elevated with attention  being paid to avoiding the patellar tendon on the tibial tubercle.  The  patella was everted and the knee flexed.  ACL and PCL removed.   A drill was  used to create a starting hole in the distal femur.  The canal was irrigated  and 5 degree right valgus alignment guide placed.  Referencing off the  posterior condyles, rotation is marked and a block pinned to remove 10 mm  off the distal femur.  Distal femoral resection is made with an oscillating  saw, a sizing block placed, and size 4 is most appropriate for the femur.  Rotation is marked off the epicondylar axis.  The block is pinned and then  the anterior and posterior cuts made for a size 4.   The tibia is subluxed forward and the menisci are removed.  Extramedullary  tibial alignment guide is placed referencing proximally at the medial aspect  of the tibial tubercle and distally along the second metatarsal axis and  tibial crest.  The block is pinned to remove 10 mm off the nondeficient  lateral side.  Tibial resection is made with an oscillating saw.  A size 4  is the most appropriate tibia, and the proximal tibia was then prepared for  a modular drill and keel punch.  Femoral preparation is then completed with  the intercondylar and chamfer cuts.   A trial size 4 posterior-stabilized femur, size 4 mobile bearing tibial  tray, and a 10 mm posterior-stabilized rotating platform insert trial  placed.  Full extension is achieved with excellent varus and valgus balance  throughout full range of motion.  The patella was then everted, thickness  measured to be 23 mm, free-hand resection taken to 12 mm, a 41 template  placed, lug holes drilled, trial patella placed, and it tracks normally.  The osteophytes are then removed off the posterior femur with the trials in  place.  All trials are removed, then the cut bone surface is then prepared  with pulsatile lavage.  The cement is mixed and once ready for implantation,  a size 4 mobile bearing tibial tray, a size 4 posterior-stabilized femur,  and a 41 patella are cemented into place.  The patella is held with a clamp.  A  trial insert 10 mm is placed and the knee held in full extension, all  extruded cement removed.  Once the cement is fully hardened, then the  permanent 10 mm posterior-stabilized rotating platform insert is placed.  Once again full extension is achieved with excellent varus and valgus  balance throughout full range of motion.  The wound is copiously irrigated  with antibiotic solution and extensor mechanism closed over a Hemovac drain  with interrupted #1 PDS.  The flexion against gravity is 145 degrees.  Tourniquet is released with a total time of 39 minutes and minor bleeding  stopped with cautery.  The subcu is closed with interrupted 2-0 Vicryl,  subcuticular running 4-0 Monocryl.  The incision is cleaned and dried and  Steri-Strips and a bulky sterile dressing applied.  The patient is  subsequently awakened and transferred to recovery in stable condition.                                               Ollen Gross, M.D.    FA/MEDQ  D:  04/01/2003  T:  04/01/2003  Job:  [id number]

## 2010-10-30 NOTE — Op Note (Signed)
[facility]  Patient:    [name], [name] Visit Number: [id number] MRN: [id number]          Service Type: SUR Location: [id number] Attending Physician:  Barton Fanny Dictated by:   Hewitt Shorts, M.D. Proc. Date: 04/12/01 Admit Date:  04/12/2001                             Operative Report  PREOPERATIVE DIAGNOSIS:   Lumbar stenosis.  POSTOPERATIVE DIAGNOSIS:  Lumbar stenosis.  OPERATION:  L3-L4 and L4-L5 lumbar laminectomy  SURGEON:  Hewitt Shorts, M.D.  ASSISTANT:  Payton Doughty, M.D.  ANESTHESIA:  General endotracheal.  INDICATIONS: This is a 77 year old man who presented with neurogenic claudication and was found to have multilevel, multifactorial lumbar stenosis. A decision was made to proceed with decompressive lumbar laminectomy.  DESCRIPTION OF PROCEDURE:  The patient was brought to the operating room and placed under general endotracheal anesthesia.  The patient was turned to a prone position.  The lumbar region was prepped with Betadine soap and solution and draped in a sterile fashion.  The midline incision was infiltrated with local anesthetic with epinephrine.  An x-ray was taken and the L3-L5 levels identified. A midline incision was made and carried down through the subcutaneous tissue.  Bipolar cautery and electrocautery were used to maintain hemostasis throughout the case.  Dissection was carried down to the lumbar fascia where she she was incised bilaterally in the paraspinal muscles and the spinous process and lamina in a subperiosteal fashion.  An x-ray was taken of the L3, L4 and L5 spinous process and lamina were identified.  Laminectomy was begun with double action rongeurs and continued with the [facility] drill and Kerrison punches.  Care was taken to leave the underlying thecal sac undisturbed.  The ligament of flavum was both thickened and degenerated.  This was carefully removed, and the thecal  sac decompressed.  The ligament of flavum that extended laterally was carefully removed, and the nerve roots and foramina were carefully examined and the L3-L4, L5-S1 nerve roots bilaterally were felt to be well decompressed.  We then established hemostasis with the use of Gelfoam soaked in thrombin.  The ends of the bone were waxed as necessary.  Once hemostasis was established, 2 cc of fentanyl were infused into the epidural space and then the paraspinal muscles were approximated with interrupted undyed 1 Vicryl sutures.  The deep fascia was closed with interrupted undyed 1 Vicryl sutures.  The subcutaneous tissue was approximated with undyed 1 Vicryl sutures and the subcutaneous and subcuticular closed with interrupted inverted 2-0 Vicryl undyed sutures.  The skin was reapproximated with Dermabond.  The patient tolerated the procedure well.  The estimated blood loss was 100 cc. Sponge and needle counts were correct.  Following the surgery, the patient was turned back to the supine position to be reversed from the anesthetic, extubated and transferred to the recovery room for further care. Dictated by:   Hewitt Shorts, M.D. Attending Physician:  Barton Fanny DD:  04/12/01 TD:  04/13/01 Job: [id number]

## 2011-07-29 ENCOUNTER — Encounter (INDEPENDENT_AMBULATORY_CARE_PROVIDER_SITE_OTHER): Payer: Medicare Other | Admitting: *Deleted

## 2011-07-29 DIAGNOSIS — I724 Aneurysm of artery of lower extremity: Secondary | ICD-10-CM

## 2011-07-29 DIAGNOSIS — Z48812 Encounter for surgical aftercare following surgery on the circulatory system: Secondary | ICD-10-CM

## 2011-07-29 DIAGNOSIS — I739 Peripheral vascular disease, unspecified: Secondary | ICD-10-CM

## 2011-08-04 ENCOUNTER — Encounter: Payer: Self-pay | Admitting: Vascular Surgery

## 2011-08-04 NOTE — Procedures (Unsigned)
BYPASS GRAFT EVALUATION  INDICATION:  Follow up left above-knee to below-knee popliteal graft.  HISTORY: Diabetes:  Yes. Cardiac: Hypertension:  Yes. Smoking:  Previous. Previous Surgery:  Left lower extremity graft and aortobiiliac graft for aneurysm repair in 1995.  SINGLE LEVEL ARTERIAL EXAM                              RIGHT              LEFT Brachial: Anterior tibial: Posterior tibial: Peroneal: Ankle/brachial index:        1.01               1.01  PREVIOUS ABI:  Date: 07/28/10  RIGHT:  0.67  LEFT:  0.83  LOWER EXTREMITY BYPASS GRAFT DUPLEX EXAM:  DUPLEX: 1. Patent left above-knee to below-knee popliteal graft with elevated     velocities near the proximal anastomosis.  Mild disease is observed     at the proximal and distal anastomoses.  Waveforms are triphasic     throughout and distal to the graft. 2. The right popliteal artery is patent with triphasic waveforms.     There is mild ectasia observed with a maximum above-knee diameter     of 1.05 cm.  IMPRESSION: 1. Patent left lower extremity graft with mild anastomotic disease     observed. 2. Mild ectasia of the right popliteal artery with measurements as     described above.  ___________________________________________ Larina Earthly, M.D.  LT/MEDQ  D:  07/29/2011  T:  07/29/2011  Job:  [id number]

## 2012-06-02 ENCOUNTER — Other Ambulatory Visit: Payer: Self-pay

## 2012-06-02 ENCOUNTER — Ambulatory Visit (INDEPENDENT_AMBULATORY_CARE_PROVIDER_SITE_OTHER): Payer: Medicare Other | Admitting: Cardiology

## 2012-06-02 ENCOUNTER — Encounter: Payer: Self-pay | Admitting: Cardiology

## 2012-06-02 VITALS — BP 142/72 | HR 75 | Ht 72.0 in | Wt 295.8 lb

## 2012-06-02 DIAGNOSIS — R072 Precordial pain: Secondary | ICD-10-CM

## 2012-06-02 DIAGNOSIS — R011 Cardiac murmur, unspecified: Secondary | ICD-10-CM

## 2012-06-02 DIAGNOSIS — I739 Peripheral vascular disease, unspecified: Secondary | ICD-10-CM

## 2012-06-02 DIAGNOSIS — E7800 Pure hypercholesterolemia, unspecified: Secondary | ICD-10-CM

## 2012-06-02 DIAGNOSIS — E119 Type 2 diabetes mellitus without complications: Secondary | ICD-10-CM

## 2012-06-02 DIAGNOSIS — I10 Essential (primary) hypertension: Secondary | ICD-10-CM

## 2012-06-02 DIAGNOSIS — R079 Chest pain, unspecified: Secondary | ICD-10-CM

## 2012-06-02 NOTE — Patient Instructions (Signed)
We will schedule you for an Echocardiogram and a nuclear stress test.  Continue your current therapy.

## 2012-06-02 NOTE — Progress Notes (Signed)
[name] [name] Date of Birth: 1932-02-04 Medical Record #[id number]  History of Present Illness: [name] [name] is seen today at the request of Dr. Eloise Harman for evaluation of chest pain. He is a pleasant 78 year old white male who has a history of diabetes mellitus, hypertension, hypercholesterolemia, and PAD. He is status post abdominal aortic aneurysm repair and left popliteal aneurysm repair in 1995. He reports he has had a nuclear stress test in the past but cannot recall when. He has no history of myocardial infarction. He states he has had intermittent pain in his left breast over the last 2 months. He describes a quick pain it is not related to activity. He has no associated shortness of breath or palpitations. He has been told that he has a murmur in the past.  Current Outpatient Prescriptions on File Prior to Visit  Medication Sig Dispense Refill  . allopurinol (ZYLOPRIM) 300 MG tablet Take 150 mg by mouth daily.       Marland Kitchen amLODipine (NORVASC) 5 MG tablet Take 5 mg by mouth daily.       Marland Kitchen losartan (COZAAR) 100 MG tablet Take 100 mg by mouth daily.       . metFORMIN (GLUCOPHAGE-XR) 500 MG 24 hr tablet Take 250 mg by mouth as directed.       Marland Kitchen omeprazole (PRILOSEC) 20 MG capsule Take 20 mg by mouth daily.      . pioglitazone (ACTOS) 15 MG tablet Take 15 mg by mouth daily.       . simvastatin (ZOCOR) 80 MG tablet Take 80 mg by mouth at bedtime.         No Known Allergies  Past Medical History  Diagnosis Date  . Osteoarthritis     left knee  . Pneumonia   . Hypertension   . PVD (peripheral vascular disease)   . History of hiatal hernia   . Acid reflux disease   . Hx of urinary tract infection   . Diabetes mellitus, type 2   . Arthritis of back   . History of gout   . Hypercholesterolemia     Past Surgical History  Procedure Date  . Abdominal aortic aneurysm repair   . Femoral-popliteal bypass graft     left  . Appendectomy   . Lumbar spine surgery   . Total knee arthroplasty       bilateral    History  Smoking status  . Former Smoker  Smokeless tobacco  . Not on file    History  Alcohol Use     Family History  Problem Relation Age of Onset  . Heart attack Mother   . Alzheimer's disease Father     Review of Systems: The review of systems is positive for severe osteoarthritis of the knees. He walks with a cane.  All other systems were reviewed and are negative.  Physical Exam: BP 142/72  Pulse 75  Ht 6' (1.829 m)  Wt 295 lb 12.8 oz (134.174 kg)  BMI 40.12 kg/m2 He is a morbidly obese white male in no acute distress. HEENT: He is balding. He wears glasses. Pupils equal round and reactive. Sclera clear. Oropharynx is clear. His neck is thick without discernible JVD. Carotid upstrokes are normal with radiated murmur. He has no thyromegaly or adenopathy. Lungs: Clear Cardiovascular: Regular rate and rhythm. There is a grade 2/6 systolic murmur heard best at the right upper sternal border radiating to the apex and to the carotids. S1 and S2 are normal. PMI is normal.  Abdomen: Morbidly obese. Well-healed midline scar. Bowel sounds are positive. No masses or bruits. Extremities: 1+ edema. Pedal pulses are palpable. Skin: Warm and dry Neuro: Alert and oriented x3. Cranial nerves II through XII are intact. He does walk with a cane. LABORATORY DATA: ECG today demonstrates normal sinus rhythm with a normal ECG.  Assessment / Plan: 1. Chest pain with atypical features. Patient has multiple cardiac risk factors. I recommended a nuclear stress test to further evaluate his cardiac risk.  2. Cardiac murmur. Exam is most consistent with aortic valve sclerosis/stenosis. We will obtain an echocardiogram to further evaluate.  3. PAD status post abdominal aortic aneurysm repair and left popliteal aneurysm repair  4. Diabetes mellitus type 2.  5. Hypertension  6. Hyperlipidemia.

## 2012-06-13 ENCOUNTER — Ambulatory Visit (HOSPITAL_COMMUNITY): Payer: Medicare Other | Attending: Cardiovascular Disease | Admitting: Radiology

## 2012-06-13 DIAGNOSIS — E7800 Pure hypercholesterolemia, unspecified: Secondary | ICD-10-CM

## 2012-06-13 DIAGNOSIS — R072 Precordial pain: Secondary | ICD-10-CM | POA: Insufficient documentation

## 2012-06-13 DIAGNOSIS — I10 Essential (primary) hypertension: Secondary | ICD-10-CM | POA: Insufficient documentation

## 2012-06-13 DIAGNOSIS — I739 Peripheral vascular disease, unspecified: Secondary | ICD-10-CM | POA: Insufficient documentation

## 2012-06-13 DIAGNOSIS — R079 Chest pain, unspecified: Secondary | ICD-10-CM

## 2012-06-13 DIAGNOSIS — R011 Cardiac murmur, unspecified: Secondary | ICD-10-CM | POA: Insufficient documentation

## 2012-06-13 DIAGNOSIS — E119 Type 2 diabetes mellitus without complications: Secondary | ICD-10-CM

## 2012-06-13 NOTE — Progress Notes (Signed)
Echocardiogram performed.  

## 2012-06-21 ENCOUNTER — Encounter (HOSPITAL_COMMUNITY): Payer: Medicare Other

## 2012-06-21 ENCOUNTER — Encounter: Payer: Self-pay | Admitting: Cardiology

## 2012-06-27 ENCOUNTER — Ambulatory Visit (HOSPITAL_COMMUNITY): Payer: Medicare Other | Attending: Cardiology | Admitting: Radiology

## 2012-06-27 VITALS — BP 136/73 | Ht 70.0 in | Wt 293.0 lb

## 2012-06-27 DIAGNOSIS — E119 Type 2 diabetes mellitus without complications: Secondary | ICD-10-CM

## 2012-06-27 DIAGNOSIS — R079 Chest pain, unspecified: Secondary | ICD-10-CM | POA: Insufficient documentation

## 2012-06-27 DIAGNOSIS — E7800 Pure hypercholesterolemia, unspecified: Secondary | ICD-10-CM

## 2012-06-27 DIAGNOSIS — I10 Essential (primary) hypertension: Secondary | ICD-10-CM

## 2012-06-27 DIAGNOSIS — I739 Peripheral vascular disease, unspecified: Secondary | ICD-10-CM

## 2012-06-27 DIAGNOSIS — R011 Cardiac murmur, unspecified: Secondary | ICD-10-CM

## 2012-06-27 DIAGNOSIS — R0602 Shortness of breath: Secondary | ICD-10-CM

## 2012-06-27 DIAGNOSIS — R072 Precordial pain: Secondary | ICD-10-CM

## 2012-06-27 MED ORDER — TECHNETIUM TC 99M SESTAMIBI GENERIC - CARDIOLITE
33.0000 | Freq: Once | INTRAVENOUS | Status: AC | PRN
  Administered 2012-06-27: 33 via INTRAVENOUS

## 2012-06-27 MED ORDER — REGADENOSON 0.4 MG/5ML IV SOLN
0.4000 mg | Freq: Once | INTRAVENOUS | Status: AC
  Administered 2012-06-27: 0.4 mg via INTRAVENOUS

## 2012-06-27 NOTE — Progress Notes (Signed)
[facility] SITE 3 NUCLEAR MED [address] [place], [place] [address] [phone]    Cardiology Nuclear Med Study  Marcas Bowsher is a 79 y.o. male     MRN : [id number]     DOB: 02-Nov-1931  Procedure Date: 06/27/2012  Nuclear Med Background Indication for Stress Test:  Evaluation for Ischemia History:  06/13/12 Echo EF=60%m mild AS;Myocardial Perfusion Study;95' AAArepair Cardiac Risk Factors: Family History - CAD, History of Smoking, Hypertension, Lipids, NIDDM, Obesity and PVD  Symptoms:  Chest Pain   Nuclear Pre-Procedure Caffeine/Decaff Intake:  None > 12 hrs NPO After: 8:00am   Lungs:  clear O2 Sat: 96% on room air. IV 0.9% NS with Angio Cath:  22g  IV Site: R Hand x 1, tolerated well IV Started by:  Irean Hong, RN  Chest Size (in):  56" Cup Size: n/a  Height: 5\' 10"  (1.778 m)  Weight:  293 lb (132.904 kg)  BMI:  Body mass index is 42.04 kg/(m^2). Tech Comments:  No diabetic medication today    Nuclear Med Study 1 or 2 day study: 2 day  Stress Test Type:  Lexiscan  Reading MD: Kristeen Miss, MD  Order Authorizing Provider:  Peter Swaziland, MD  Resting Radionuclide: Technetium 39m Sestamibi  Resting Radionuclide Dose: 33.0 mCi  06/28/12  Stress Radionuclide:  Technetium 14m Sestamibi  Stress Radionuclide Dose: 33.0 mCi 06/27/12           Stress Protocol Rest HR: 76 Stress HR: 96  Rest BP: 136/73 Stress BP: 139/72  Exercise Time (min): n/a METS: n/a   Predicted Max HR: 142 bpm % Max HR: 67.61 bpm Rate Pressure Product: 21308    Dose of Adenosine (mg):  n/a Dose of Lexiscan: 0.4 mg  Dose of Atropine (mg): n/a Dose of Dobutamine: n/a mcg/kg/min (at max HR)  Stress Test Technologist: Frederick Peers, EMT-P  Nuclear Technologist:  Domenic Polite, CNMT     Rest Procedure:  Myocardial perfusion imaging was performed at rest 45 minutes following the intravenous administration of Technetium 44m Sestamibi. Rest ECG: NSR - Normal EKG  Stress Procedure:  The  patient received IV Lexiscan 0.4 mg over 15-seconds.  Technetium 25m Sestamibi injected at 30-seconds.  Quantitative spect images were obtained after a 45 minute delay. Stress ECG: No significant change from baseline ECG  QPS Raw Data Images:  There is interference from nuclear activity from structures below the diaphragm. This does not affect the ability to read the study. Stress Images:  There is a small area of mild attenuation in the inferior basal walls with normal uptake in the other areas.   Rest Images:  There is a small area of mild attenuation in the inferior basal walls with normal uptake in the other areas. Subtraction (SDS):  No evidence of ischemia. Transient Ischemic Dilatation (Normal <1.22):  0.94 Lung/Heart Ratio (Normal <0.45):  0.25  Quantitative Gated Spect Images QGS EDV:  107 ml QGS ESV:  45 ml  Impression Exercise Capacity:  Lexiscan with no exercise. BP Response:  Normal blood pressure response. Clinical Symptoms:  No significant symptoms noted. ECG Impression:  No significant ST segment change suggestive of ischemia. Comparison with Prior Nuclear Study: No images to compare  Overall Impression:  Normal stress nuclear study.  No evidence of ischemia.  LV Ejection Fraction: 58%.  LV Wall Motion:  NL LV Function; NL Wall Motion.   Vesta Mixer, Montez Hageman., MD, [facility] 06/28/2012, 5:05 PM Office - [phone] Pager [phone]

## 2012-06-28 ENCOUNTER — Ambulatory Visit (HOSPITAL_COMMUNITY): Payer: Medicare Other | Attending: Internal Medicine

## 2012-06-28 DIAGNOSIS — R0989 Other specified symptoms and signs involving the circulatory and respiratory systems: Secondary | ICD-10-CM

## 2012-06-28 MED ORDER — TECHNETIUM TC 99M SESTAMIBI GENERIC - CARDIOLITE
33.0000 | Freq: Once | INTRAVENOUS | Status: AC | PRN
  Administered 2012-06-28: 33 via INTRAVENOUS

## 2012-07-18 ENCOUNTER — Other Ambulatory Visit: Payer: Self-pay | Admitting: *Deleted

## 2012-07-18 DIAGNOSIS — Z48812 Encounter for surgical aftercare following surgery on the circulatory system: Secondary | ICD-10-CM

## 2012-07-18 DIAGNOSIS — I739 Peripheral vascular disease, unspecified: Secondary | ICD-10-CM

## 2012-07-31 ENCOUNTER — Other Ambulatory Visit: Payer: Self-pay | Admitting: *Deleted

## 2012-07-31 ENCOUNTER — Encounter: Payer: Self-pay | Admitting: Vascular Surgery

## 2012-07-31 DIAGNOSIS — I739 Peripheral vascular disease, unspecified: Secondary | ICD-10-CM

## 2012-07-31 DIAGNOSIS — Z48812 Encounter for surgical aftercare following surgery on the circulatory system: Secondary | ICD-10-CM

## 2012-08-01 ENCOUNTER — Ambulatory Visit: Payer: Medicare Other | Admitting: Vascular Surgery

## 2012-09-12 ENCOUNTER — Ambulatory Visit: Payer: Medicare Other | Admitting: Vascular Surgery

## 2012-10-09 ENCOUNTER — Other Ambulatory Visit (HOSPITAL_COMMUNITY): Payer: Self-pay | Admitting: Internal Medicine

## 2012-10-09 DIAGNOSIS — R110 Nausea: Secondary | ICD-10-CM

## 2012-10-09 DIAGNOSIS — K8020 Calculus of gallbladder without cholecystitis without obstruction: Secondary | ICD-10-CM

## 2012-10-10 ENCOUNTER — Ambulatory Visit (HOSPITAL_COMMUNITY)
Admission: RE | Admit: 2012-10-10 | Discharge: 2012-10-10 | Disposition: A | Discharge status: Home / Self Care | Payer: Medicare Other | Source: Ambulatory Visit | Attending: Internal Medicine | Admitting: Internal Medicine

## 2012-10-10 ENCOUNTER — Other Ambulatory Visit (HOSPITAL_COMMUNITY): Payer: Self-pay | Admitting: Internal Medicine

## 2012-10-10 DIAGNOSIS — R52 Pain, unspecified: Secondary | ICD-10-CM

## 2012-10-10 DIAGNOSIS — K8020 Calculus of gallbladder without cholecystitis without obstruction: Secondary | ICD-10-CM | POA: Insufficient documentation

## 2012-10-10 DIAGNOSIS — I77810 Thoracic aortic ectasia: Secondary | ICD-10-CM | POA: Insufficient documentation

## 2012-10-10 DIAGNOSIS — J984 Other disorders of lung: Secondary | ICD-10-CM | POA: Insufficient documentation

## 2012-10-10 DIAGNOSIS — M479 Spondylosis, unspecified: Secondary | ICD-10-CM | POA: Insufficient documentation

## 2012-10-10 DIAGNOSIS — R110 Nausea: Secondary | ICD-10-CM | POA: Insufficient documentation

## 2012-10-10 DIAGNOSIS — R091 Pleurisy: Secondary | ICD-10-CM | POA: Insufficient documentation

## 2012-10-10 DIAGNOSIS — IMO0002 Reserved for concepts with insufficient information to code with codable children: Secondary | ICD-10-CM | POA: Insufficient documentation

## 2012-10-10 DIAGNOSIS — N289 Disorder of kidney and ureter, unspecified: Secondary | ICD-10-CM | POA: Insufficient documentation

## 2012-10-13 ENCOUNTER — Other Ambulatory Visit: Payer: Self-pay | Admitting: Internal Medicine

## 2012-10-13 DIAGNOSIS — N281 Cyst of kidney, acquired: Secondary | ICD-10-CM

## 2012-10-18 ENCOUNTER — Ambulatory Visit
Admission: RE | Admit: 2012-10-18 | Discharge: 2012-10-18 | Disposition: A | Discharge status: Home / Self Care | Payer: Medicare Other | Source: Ambulatory Visit | Attending: Internal Medicine | Admitting: Internal Medicine

## 2012-10-18 DIAGNOSIS — N281 Cyst of kidney, acquired: Secondary | ICD-10-CM

## 2012-10-23 ENCOUNTER — Encounter: Payer: Self-pay | Admitting: Vascular Surgery

## 2012-10-24 ENCOUNTER — Encounter: Payer: Self-pay | Admitting: Vascular Surgery

## 2012-10-24 ENCOUNTER — Encounter (INDEPENDENT_AMBULATORY_CARE_PROVIDER_SITE_OTHER): Payer: Medicare Other | Admitting: *Deleted

## 2012-10-24 ENCOUNTER — Ambulatory Visit (INDEPENDENT_AMBULATORY_CARE_PROVIDER_SITE_OTHER): Payer: Medicare Other | Admitting: Vascular Surgery

## 2012-10-24 VITALS — BP 138/84 | HR 88 | Resp 18 | Ht 69.0 in | Wt 286.1 lb

## 2012-10-24 DIAGNOSIS — I739 Peripheral vascular disease, unspecified: Secondary | ICD-10-CM

## 2012-10-24 DIAGNOSIS — Z48812 Encounter for surgical aftercare following surgery on the circulatory system: Secondary | ICD-10-CM

## 2012-10-24 NOTE — Progress Notes (Signed)
The patient has today for followup of his thumb aortic aneurysm resection and left popliteal artery aneurysm resection with left above-knee to below-knee bypass with reverse saphenous vein. Both of these surgeries were on separate occasions in 1995. He came to do quite well. He is becoming more forgetful I. He still is quite alert today ago. He does have some limitations from degenerative disc disease in his back and knee difficulty. He has had bilateral knee replacements as well. He does walk with a cane.  Past Medical History  Diagnosis Date  . Osteoarthritis     left knee  . Pneumonia   . Hypertension   . PVD (peripheral vascular disease)   . History of hiatal hernia   . Acid reflux disease   . Hx of urinary tract infection   . Diabetes mellitus, type 2   . Arthritis of back   . History of gout   . Hypercholesterolemia     History  Substance Use Topics  . Smoking status: Former Games developer  . Smokeless tobacco: Not on file  . Alcohol Use:     Family History  Problem Relation Age of Onset  . Heart attack Mother   . Alzheimer's disease Father     Allergies  Allergen Reactions  . Indocin (Indomethacin) Other (See Comments)    Antiinflammatory medication for gout ? Indocin > black out per patient    Current outpatient prescriptions:allopurinol (ZYLOPRIM) 300 MG tablet, Take 150 mg by mouth daily. , Disp: , Rfl: ;  amLODipine (NORVASC) 5 MG tablet, Take 5 mg by mouth daily. , Disp: , Rfl: ;  aspirin 81 MG tablet, Take 81 mg by mouth daily., Disp: , Rfl: ;  losartan (COZAAR) 100 MG tablet, Take 100 mg by mouth daily. , Disp: , Rfl: ;  metFORMIN (GLUCOPHAGE-XR) 500 MG 24 hr tablet, Take 250 mg by mouth as directed. , Disp: , Rfl:  omeprazole (PRILOSEC) 20 MG capsule, Take 20 mg by mouth daily., Disp: , Rfl: ;  pioglitazone (ACTOS) 15 MG tablet, Take 15 mg by mouth daily. , Disp: , Rfl: ;  simvastatin (ZOCOR) 80 MG tablet, Take 80 mg by mouth at bedtime. , Disp: , Rfl:   BP 138/84   Pulse 88  Resp 18  Ht 5\' 9"  (1.753 m)  Wt 286 lb 1.6 oz (129.774 kg)  BMI 42.23 kg/m2  Body mass index is 42.23 kg/(m^2).       Physical exam: Well-developed gentleman appearing stated age in no acute distress Neurologically he is grossly intact Pulse status 2+ radial 2+ popliteal and 2+ posterior tibial pulses bilaterally. He does have prominent popliteal pulse on the right but no palpable aneurysm Abdomen soft obese and no masses noted Respirations are nonlabored Skin without ulcers or rashes   Vascular lab studies today reveal the ankle arm index of 1.0 on the right and 0.93 on the left with biphasic flow. Merging on the left revealed patent bypass with some elevated velocities at the proximal anastomosis.  Impression and plan stable for nearly 20 years out from open aneurysm resection and above-knee to below-knee popliteal bypass for her left popliteal artery aneurysm. His right popliteal artery remains with mild ectasia at 0.96. Would recommend a continued yearly surveillance followup.

## 2012-10-26 ENCOUNTER — Ambulatory Visit (INDEPENDENT_AMBULATORY_CARE_PROVIDER_SITE_OTHER): Payer: Medicare Other | Admitting: Surgery

## 2012-10-26 VITALS — BP 140/80 | HR 68 | Temp 98.0°F | Resp 18 | Ht 70.0 in | Wt 286.0 lb

## 2012-10-26 DIAGNOSIS — K8020 Calculus of gallbladder without cholecystitis without obstruction: Secondary | ICD-10-CM

## 2012-10-26 DIAGNOSIS — K432 Incisional hernia without obstruction or gangrene: Secondary | ICD-10-CM

## 2012-10-26 NOTE — Progress Notes (Addendum)
Re:   Stoy Fenn DOB:   Oct 18, 1931 MRN:   [id number]  ASSESSMENT AND PLAN: 1.  Gall stones, asymptomatic  Mild nausea with morning coffee x 4 to 6 months, but really no symptoms.  I discussed with the patient the indications and risks of gall bladder surgery.  The primary risks of gall bladder surgery include, but are not limited to, bleeding, infection, common bile duct injury, and open surgery.  I tried to answer the patient's questions.  I gave the patient literature about gall bladder surgery.  I discussed with him about "asymptomatic" gall stones.  I think at this time and his age, these can be observed.  He knows that if he has abdominal symptoms, he will call us back.  He wants me to recheck him in one year.  2.  Abdominal wall hernia  There are at least 3 identifiable abdominal fascial defects on the MRI (10/18/2012) - from cranial to caudad the diameter: 4.4 cm, 3.5 cm, 7.2 cm.  And for evaluation of the abdominal wall, is incomplete.  This will complicate laparoscopic gall bladder surgery and will have to be reviewed before any surgery.  The hernias have been there a while and are asymptomatic for the patient.  3.  Right kidney cyst and left upper pole renal cyst 4.  OSA  On CPAP 5.  Diabetes mellitus, Type 2 6.  Peripheral vascular disease 7.  Hypertension 8.  GERD 9.  History of gout 10.  History of bilateral knee replacement - [facility] - 2004 11. Possible hepatic steatosis on [place].  Chief Complaint  Patient presents with  . New Evaluation    gallstone   REFERRING PHYSICIAN: Garlan Fillers, MD  HISTORY OF PRESENT ILLNESS: Joann Kulpa is a 79 y.o. (DOB: 1931-07-28)  white  male whose primary care physician is Garlan Fillers, MD and comes to me today for gall stones.  Mr. Schoneman has some GERD and some nausea when he takes his coffee in the morning.  He takes Prilosec for the GERD, which seems to work fairly well.  He got a CT of his abdomen in April, 2014.  He was not  sure why he got the CT scan.  He has had no specific abdominal pain, no change in bowel habits, or change in appetite.  The CT scan identified some renal lesions.  So this lead to an [place].  The [place] only partially resolved what the renal lesions were, so he got an MRI on 10/18/2012.  The MRI resolved that the lesions in both kidneys were cysts.  He was also found to have gall stones on both the [place] and MRI.  So the question is what do we do about the gall stones? He has no history of liver disease.  No history of gall bladder disease.  No history of pancreas disease.  No history of colon disease.  He had a colonoscopy about 10 years ago.  He had an appendectomy at age 23. I think that at this time, his gall stones are most likely asymptomatic and were an incidental finding on his studies.  [place] - 10/10/2012 - gall stones.  Right kidney cyst - 5.6 cm MRI - 10/18/2012 - bilateral renal cysts   Past Medical History  Diagnosis Date  . Osteoarthritis     left knee  . Pneumonia   . Hypertension   . PVD (peripheral vascular disease)   . History of hiatal hernia   . Acid reflux disease   .  Hx of urinary tract infection   . Diabetes mellitus, type 2   . Arthritis of back   . History of gout   . Hypercholesterolemia       Past Surgical History  Procedure Laterality Date  . Abdominal aortic aneurysm repair    . Femoral-popliteal bypass graft      left  . Appendectomy    . Lumbar spine surgery    . Total knee arthroplasty      bilateral      Current Outpatient Prescriptions  Medication Sig Dispense Refill  . allopurinol (ZYLOPRIM) 300 MG tablet Take 150 mg by mouth daily.       Marland Kitchen amLODipine (NORVASC) 5 MG tablet Take 5 mg by mouth daily.       Marland Kitchen aspirin 81 MG tablet Take 81 mg by mouth daily.      Marland Kitchen losartan (COZAAR) 100 MG tablet Take 100 mg by mouth daily.       . metFORMIN (GLUCOPHAGE-XR) 500 MG 24 hr tablet Take 250 mg by mouth as directed.       . pioglitazone (ACTOS) 15 MG tablet Take 15 mg  by mouth daily.       . simvastatin (ZOCOR) 80 MG tablet Take 80 mg by mouth at bedtime.       Marland Kitchen omeprazole (PRILOSEC) 20 MG capsule Take 20 mg by mouth daily.       No current facility-administered medications for this visit.      Allergies  Allergen Reactions  . Indocin (Indomethacin) Other (See Comments)    Antiinflammatory medication for gout ? Indocin > black out per patient    REVIEW OF SYSTEMS: Skin:  No history of rash.  No history of abnormal moles. Infection:  No history of hepatitis or HIV.  No history of MRSA. Neurologic:  No history of stroke.  No history of seizure.  No history of headaches. Cardiac:  Hypertension x 5 years.  Peripheral vascular disease.  History of AAA repair 1995 by Dr. Arbie Cookey.  He's also had a left fem-pop. He had normal cardiology stress nuclear med test 06/27/2012. Pulmonary:  OSA on CPAP.  Quit smoking around 1990.  Endocrine:  Diabetes mellitus x 5 years.  No thyroid disease. Gastrointestinal: See HPI. Urologic:  No history of kidney stones.  No history of bladder infections. Musculoskeletal:  No history of joint or back disease. Hematologic:  Gout.  Last attack about 20 years ago.  History of bilateral knee replacement.  Right knee - 2004 and left knee - 2010 by Dr. Despina Hick. Lumbar laminectomy by Dr. Newell Coral - 2002. Psycho-social:  The patient is oriented.   The patient has no obvious psychologic or social impairment to understanding our conversation and plan.  SOCIAL and FAMILY HISTORY: Married.  Came by himself to our office. Was on the [employer] x 3 years during [place].  PHYSICAL EXAM: BP 140/80  Pulse 68  Temp(Src) 98 F (36.7 C)  Resp 18  Ht 5\' 10"  (1.778 m)  Wt 286 lb (129.729 kg)  BMI 41.04 kg/m2  General: Obese WM who is alert and generally healthy appearing.  HEENT: Normal. Pupils equal. Neck: Supple. No mass.  No thyroid mass. Lymph Nodes:  No supraclavicular or cervical nodes. Lungs: Clear to auscultation and  symmetric breath sounds. Heart:  RRR. 2/6 murmur. Abdomen: Soft. No mass. No tenderness. Normal bowel sounds.  Obese.  More apple than pear. Incisional hernias in old midline incision (from his AAA repair).  It  is hard to feel the edge of these hernias, but they are relative large. Rectal: Not done. Extremities:  Good strength and ROM  in upper and lower extremities. Neurologic:  Grossly intact to motor and sensory function. Psychiatric: Has normal mood and affect. Behavior is normal.   DATA REVIEWED: Epic notes and records.  Ovidio Kin, MD,  [facility] [address] [place]  [address]   [place], [place]    [address] Phone:  [phone] FAX:  [phone]

## 2012-10-29 DIAGNOSIS — K432 Incisional hernia without obstruction or gangrene: Secondary | ICD-10-CM | POA: Insufficient documentation

## 2012-10-31 ENCOUNTER — Other Ambulatory Visit: Payer: Self-pay | Admitting: *Deleted

## 2012-10-31 DIAGNOSIS — I739 Peripheral vascular disease, unspecified: Secondary | ICD-10-CM

## 2012-10-31 DIAGNOSIS — Z48812 Encounter for surgical aftercare following surgery on the circulatory system: Secondary | ICD-10-CM

## 2012-12-21 ENCOUNTER — Other Ambulatory Visit (HOSPITAL_COMMUNITY): Payer: Self-pay | Admitting: Internal Medicine

## 2012-12-21 DIAGNOSIS — K8020 Calculus of gallbladder without cholecystitis without obstruction: Secondary | ICD-10-CM

## 2013-01-01 ENCOUNTER — Encounter (HOSPITAL_COMMUNITY): Payer: Medicare Other

## 2013-01-08 ENCOUNTER — Encounter (HOSPITAL_COMMUNITY)
Admission: RE | Admit: 2013-01-08 | Discharge: 2013-01-08 | Disposition: A | Discharge status: Home / Self Care | Payer: Medicare Other | Source: Ambulatory Visit | Attending: Internal Medicine | Admitting: Internal Medicine

## 2013-01-08 DIAGNOSIS — K8020 Calculus of gallbladder without cholecystitis without obstruction: Secondary | ICD-10-CM | POA: Insufficient documentation

## 2013-01-08 DIAGNOSIS — R109 Unspecified abdominal pain: Secondary | ICD-10-CM | POA: Insufficient documentation

## 2013-01-08 MED ORDER — TECHNETIUM TC 99M MEBROFENIN IV KIT
5.0000 | PACK | Freq: Once | INTRAVENOUS | Status: AC | PRN
  Administered 2013-01-08: 5 via INTRAVENOUS

## 2013-08-01 ENCOUNTER — Other Ambulatory Visit: Payer: Self-pay | Admitting: Vascular Surgery

## 2013-08-01 DIAGNOSIS — Z48812 Encounter for surgical aftercare following surgery on the circulatory system: Secondary | ICD-10-CM

## 2013-08-01 DIAGNOSIS — I724 Aneurysm of artery of lower extremity: Secondary | ICD-10-CM

## 2013-08-29 ENCOUNTER — Other Ambulatory Visit (HOSPITAL_COMMUNITY): Payer: Self-pay | Admitting: Urology

## 2013-08-29 DIAGNOSIS — N281 Cyst of kidney, acquired: Secondary | ICD-10-CM

## 2013-09-17 ENCOUNTER — Ambulatory Visit (HOSPITAL_COMMUNITY)
Admission: RE | Admit: 2013-09-17 | Discharge: 2013-09-17 | Disposition: A | Discharge status: Home / Self Care | Payer: Medicare Other | Source: Ambulatory Visit | Attending: Urology | Admitting: Urology

## 2013-09-17 DIAGNOSIS — Q6101 Congenital single renal cyst: Secondary | ICD-10-CM | POA: Insufficient documentation

## 2013-09-17 DIAGNOSIS — N289 Disorder of kidney and ureter, unspecified: Secondary | ICD-10-CM | POA: Insufficient documentation

## 2013-09-17 DIAGNOSIS — I10 Essential (primary) hypertension: Secondary | ICD-10-CM | POA: Insufficient documentation

## 2013-09-17 DIAGNOSIS — N281 Cyst of kidney, acquired: Secondary | ICD-10-CM

## 2013-09-17 DIAGNOSIS — E119 Type 2 diabetes mellitus without complications: Secondary | ICD-10-CM | POA: Insufficient documentation

## 2013-09-24 ENCOUNTER — Other Ambulatory Visit (HOSPITAL_COMMUNITY): Payer: Self-pay | Admitting: Internal Medicine

## 2013-09-24 DIAGNOSIS — K8020 Calculus of gallbladder without cholecystitis without obstruction: Secondary | ICD-10-CM

## 2013-10-03 ENCOUNTER — Ambulatory Visit (HOSPITAL_COMMUNITY)
Admission: RE | Admit: 2013-10-03 | Discharge: 2013-10-03 | Disposition: A | Discharge status: Home / Self Care | Payer: Medicare Other | Source: Ambulatory Visit | Attending: Internal Medicine | Admitting: Internal Medicine

## 2013-10-03 DIAGNOSIS — K8020 Calculus of gallbladder without cholecystitis without obstruction: Secondary | ICD-10-CM | POA: Insufficient documentation

## 2013-10-03 MED ORDER — TECHNETIUM TC 99M MEBROFENIN IV KIT
4.9000 | PACK | Freq: Once | INTRAVENOUS | Status: AC | PRN
  Administered 2013-10-03: 5 via INTRAVENOUS

## 2013-10-29 ENCOUNTER — Encounter: Payer: Self-pay | Admitting: Vascular Surgery

## 2013-10-30 ENCOUNTER — Ambulatory Visit (INDEPENDENT_AMBULATORY_CARE_PROVIDER_SITE_OTHER): Payer: Medicare Other | Admitting: Vascular Surgery

## 2013-10-30 ENCOUNTER — Ambulatory Visit (HOSPITAL_COMMUNITY)
Admission: RE | Admit: 2013-10-30 | Discharge: 2013-10-30 | Disposition: A | Discharge status: Home / Self Care | Payer: Medicare Other | Source: Ambulatory Visit | Attending: Vascular Surgery | Admitting: Vascular Surgery

## 2013-10-30 ENCOUNTER — Encounter: Payer: Self-pay | Admitting: Vascular Surgery

## 2013-10-30 ENCOUNTER — Ambulatory Visit (INDEPENDENT_AMBULATORY_CARE_PROVIDER_SITE_OTHER)
Admission: RE | Admit: 2013-10-30 | Discharge: 2013-10-30 | Disposition: A | Discharge status: Home / Self Care | Payer: Medicare Other | Source: Ambulatory Visit | Attending: Vascular Surgery | Admitting: Vascular Surgery

## 2013-10-30 VITALS — BP 136/59 | HR 76 | Resp 18 | Ht 69.0 in | Wt 298.5 lb

## 2013-10-30 DIAGNOSIS — Z48812 Encounter for surgical aftercare following surgery on the circulatory system: Secondary | ICD-10-CM

## 2013-10-30 DIAGNOSIS — I724 Aneurysm of artery of lower extremity: Secondary | ICD-10-CM

## 2013-10-30 DIAGNOSIS — I739 Peripheral vascular disease, unspecified: Secondary | ICD-10-CM

## 2013-10-30 NOTE — Addendum Note (Signed)
Addended by: Sharee PimpleMCCHESNEY, MARILYN K on: 10/30/2013 01:12 PM   Modules accepted: Orders

## 2013-10-30 NOTE — Progress Notes (Signed)
Here today for followup of his peripheral vascular disease. He is status post open and resection and grafting of abdominal aortic aneurysm in 1995 by myself and also treatment of a symptomatic left popliteal artery aneurysm also 1995. He has remained stable. He is quite alert and oriented today in our office. He has again great deal of weight over the years and has difficulty walking secondary to arthritic problems.  Past Medical History  Diagnosis Date  . Osteoarthritis     left knee  . Pneumonia   . Hypertension   . PVD (peripheral vascular disease)   . History of hiatal hernia   . Acid reflux disease   . Hx of urinary tract infection   . Diabetes mellitus, type 2   . Arthritis of back   . History of gout   . Hypercholesterolemia     History  Substance Use Topics  . Smoking status: Former Games developermoker  . Smokeless tobacco: Not on file  . Alcohol Use:     Family History  Problem Relation Age of Onset  . Heart attack Mother   . Alzheimer's disease Father     Allergies  Allergen Reactions  . Indocin [Indomethacin] Other (See Comments)    Antiinflammatory medication for gout ? Indocin > black out per patient    Current outpatient prescriptions:allopurinol (ZYLOPRIM) 300 MG tablet, Take 150 mg by mouth daily. , Disp: , Rfl: ;  amLODipine (NORVASC) 5 MG tablet, Take 5 mg by mouth daily. , Disp: , Rfl: ;  aspirin 81 MG tablet, Take 81 mg by mouth daily., Disp: , Rfl: ;  JANUVIA 100 MG tablet, 100 mg daily., Disp: , Rfl: ;  losartan (COZAAR) 100 MG tablet, Take 100 mg by mouth daily. , Disp: , Rfl:  pioglitazone (ACTOS) 15 MG tablet, Take 15 mg by mouth daily. , Disp: , Rfl: ;  simvastatin (ZOCOR) 80 MG tablet, Take 80 mg by mouth at bedtime. , Disp: , Rfl: ;  metFORMIN (GLUCOPHAGE-XR) 500 MG 24 hr tablet, Take 250 mg by mouth as directed. , Disp: , Rfl: ;  omeprazole (PRILOSEC) 20 MG capsule, Take 20 mg by mouth daily., Disp: , Rfl:   BP 136/59  Pulse 76  Resp 18  Ht 5\' 9"  (1.753 m)   Wt 298 lb 8 oz (135.399 kg)  BMI 44.06 kg/m2  Body mass index is 44.06 kg/(m^2).       Physical exam well-developed well-nourished in no acute distress Carotid arteries without bruits bilaterally Heart regular rate and rhythm Palpable pulses bilaterally in the popliteal fossa Feet are well-perfused with no evidence of tissue loss and ulceration Abdomen obese with a well-healed midline incision no masses noted  Noninvasive studies today reveal biphasic waveforms with normal ankle arm indices bilaterally. Duplex imaging revealed stable right popliteal artery aneurysm at 1.1 cm. His left above-knee to below-knee pop to bypass remains patent. He does have elevated velocities the proximal anastomosis which are unchanged from a study one year ago  Impression and plan stable 20 years followup from open aneurysm resection and left above-knee to below-knee popliteal bypass. I did discuss the significance of his elevated velocities. I am comfortable with continued observation. Discussed the option of more invasive evaluation with arteriography but feel that this does put him at some increased risk. He is comfortable with continued observation. We'll see him in one year with repeat imaging studies

## 2014-11-01 ENCOUNTER — Encounter: Payer: Self-pay | Admitting: Vascular Surgery

## 2014-11-05 ENCOUNTER — Ambulatory Visit (HOSPITAL_COMMUNITY)
Admission: RE | Admit: 2014-11-05 | Discharge: 2014-11-05 | Disposition: A | Discharge status: Home / Self Care | Payer: Medicare Other | Source: Ambulatory Visit | Attending: Vascular Surgery | Admitting: Vascular Surgery

## 2014-11-05 ENCOUNTER — Other Ambulatory Visit: Payer: Self-pay | Admitting: Vascular Surgery

## 2014-11-05 ENCOUNTER — Ambulatory Visit (INDEPENDENT_AMBULATORY_CARE_PROVIDER_SITE_OTHER)
Admission: RE | Admit: 2014-11-05 | Discharge: 2014-11-05 | Disposition: A | Discharge status: Home / Self Care | Payer: Medicare Other | Source: Ambulatory Visit | Attending: Vascular Surgery | Admitting: Vascular Surgery

## 2014-11-05 ENCOUNTER — Encounter: Payer: Self-pay | Admitting: Vascular Surgery

## 2014-11-05 ENCOUNTER — Ambulatory Visit (INDEPENDENT_AMBULATORY_CARE_PROVIDER_SITE_OTHER): Payer: Medicare Other | Admitting: Vascular Surgery

## 2014-11-05 VITALS — BP 129/72 | HR 79 | Resp 18 | Ht 68.0 in | Wt 277.0 lb

## 2014-11-05 DIAGNOSIS — Z48812 Encounter for surgical aftercare following surgery on the circulatory system: Secondary | ICD-10-CM

## 2014-11-05 DIAGNOSIS — I724 Aneurysm of artery of lower extremity: Secondary | ICD-10-CM | POA: Diagnosis not present

## 2014-11-05 DIAGNOSIS — I10 Essential (primary) hypertension: Secondary | ICD-10-CM | POA: Insufficient documentation

## 2014-11-05 DIAGNOSIS — I714 Abdominal aortic aneurysm, without rupture, unspecified: Secondary | ICD-10-CM

## 2014-11-05 DIAGNOSIS — Z95828 Presence of other vascular implants and grafts: Secondary | ICD-10-CM | POA: Insufficient documentation

## 2014-11-05 DIAGNOSIS — E119 Type 2 diabetes mellitus without complications: Secondary | ICD-10-CM | POA: Insufficient documentation

## 2014-11-05 DIAGNOSIS — I739 Peripheral vascular disease, unspecified: Secondary | ICD-10-CM

## 2014-11-05 DIAGNOSIS — E785 Hyperlipidemia, unspecified: Secondary | ICD-10-CM | POA: Insufficient documentation

## 2014-11-05 DIAGNOSIS — Z87891 Personal history of nicotine dependence: Secondary | ICD-10-CM | POA: Diagnosis not present

## 2014-11-05 NOTE — Progress Notes (Signed)
History of Present Illness  [name] [name] is a 81 y.o. male who presents for his yearly follow visit s/p open and resection and grafting of abdominal aortic aneurysm in 1995 by myself and also treatment of a symptomatic left popliteal artery aneurysm also 1995.  He has no new complaints.  No back or abdominal pain.  No symptoms or claudication and no history of foot or leg ulcers.  He has just been newly diagnosed with Type I DM and has been started on insulin.  Other past medical history includes: hypertension managed with cozaar and Norvasc, hyperlipidemia managed with Zocor.  He does take a daily 81 mg aspirin for cardiac protection.    Past Medical History  Diagnosis Date  . Osteoarthritis     left knee  . Pneumonia   . Hypertension   . PVD (peripheral vascular disease)   . History of hiatal hernia   . Acid reflux disease   . Hx of urinary tract infection   . Diabetes mellitus, type 2   . Arthritis of back   . History of gout   . Hypercholesterolemia     Past Surgical History  Procedure Laterality Date  . Abdominal aortic aneurysm repair    . Femoral-popliteal bypass graft      left  . Appendectomy    . Lumbar spine surgery    . Total knee arthroplasty      bilateral    History   Social History  . Marital Status: Married    Spouse Name: N/A  . Number of Children: 4  . Years of Education: N/A   Occupational History  . Art gallery manager- telephone    Social History Main Topics  . Smoking status: Former Smoker    Quit date: 11/04/1984  . Smokeless tobacco: Never Used  . Alcohol Use: 0.0 oz/week    0 Standard drinks or equivalent per week  . Drug Use: No  . Sexual Activity: Not on file   Other Topics Concern  . Not on file   Social History Narrative    Family History  Problem Relation Age of Onset  . Heart attack Mother   . Alzheimer's disease Father     Current Outpatient Prescriptions on File Prior to Visit  Medication Sig Dispense Refill  .  allopurinol (ZYLOPRIM) 300 MG tablet Take 150 mg by mouth daily.     Marland Kitchen amLODipine (NORVASC) 5 MG tablet Take 5 mg by mouth daily.     Marland Kitchen aspirin 81 MG tablet Take 81 mg by mouth daily.    Marland Kitchen JANUVIA 100 MG tablet 100 mg daily.    Marland Kitchen losartan (COZAAR) 100 MG tablet Take 100 mg by mouth daily.     . metFORMIN (GLUCOPHAGE-XR) 500 MG 24 hr tablet Take 250 mg by mouth as directed.     Marland Kitchen omeprazole (PRILOSEC) 20 MG capsule Take 20 mg by mouth daily.    . pioglitazone (ACTOS) 15 MG tablet Take 15 mg by mouth daily.     . simvastatin (ZOCOR) 80 MG tablet Take 80 mg by mouth at bedtime.      No current facility-administered medications on file prior to visit.    Allergies  Allergen Reactions  . Indocin [Indomethacin] Other (See Comments)    Antiinflammatory medication for gout ? Indocin > black out per patient    Review of Systems: As listed above, otherwise negative.  Physical Examination  Filed Vitals:   11/05/14 1600  BP: 129/72  Pulse:  79  Resp: 18  Height: 5\' 8"  (1.727 m)  Weight: 277 lb (125.646 kg)    General: A&O x 3, WDWN   Pulmonary: Sym exp, good air movt, CTAB, no rales, rhonchi, & wheezing  Cardiac: RRR, Nl S1, S2, no Murmurs, rubs or gallops  Gastrointestinal: soft, NTND, -G/R, - HSM, - masses, - CVAT B  Vascular palpable DP/PT/femoral and radial pulses equal Bilat.  Carotid Neg bruits  Musculoskeletal: M/S 5/5 throughout  Extremities without ischemic changes   Neuro: decreased sensation bilateral feet to light touch  ABI: Right 0.71 Left 0.82 Left bypass graft with increased velocity suggestive of 50-70% stenosis  Arterial duplex: Right Biphasic PT, monophasic DP Left triphasic PT/DP   Medical Decision Making  [name] [name] is a 81 y.o. male who is here for his yearly follow up s/p open AAA repair followed by left popliteal aneurysm repair bypass.  He is doing fine with out symptoms of claudication or ulcers.  He has palpable distal pulses with patent  grafts.  Stable disposition after 21 years.  He will f/u for repeat ABI's and Duplex in 1 year.  [name] [name], [name] [name] [name] [name] [facility] [place] Office: [phone]  The patient was seen in conjunction with Dr. Arbie CookeyEarly 11/05/2014, 4:15 PM  I have examined the patient, reviewed and agree with above. Continues to do well overall. No new vascular issues. Does have new diagnosis of insulin-dependent diabetes.  [name] [name], [name], MD 11/05/2014 4:46 PM

## 2014-11-06 NOTE — Addendum Note (Signed)
Addended by: Adria DillELDRIDGE-LEWIS, Garin Mata L on: 11/06/2014 09:26 AM   Modules accepted: Orders

## 2015-10-29 ENCOUNTER — Encounter: Payer: Self-pay | Admitting: Family

## 2015-11-05 ENCOUNTER — Ambulatory Visit (HOSPITAL_COMMUNITY)
Admission: RE | Admit: 2015-11-05 | Discharge: 2015-11-05 | Disposition: A | Discharge status: Home / Self Care | Payer: Medicare Other | Source: Ambulatory Visit | Attending: Family | Admitting: Family

## 2015-11-05 ENCOUNTER — Encounter: Payer: Self-pay | Admitting: Family

## 2015-11-05 ENCOUNTER — Ambulatory Visit (INDEPENDENT_AMBULATORY_CARE_PROVIDER_SITE_OTHER)
Admission: RE | Admit: 2015-11-05 | Discharge: 2015-11-05 | Disposition: A | Discharge status: Home / Self Care | Payer: Medicare Other | Source: Ambulatory Visit | Attending: Vascular Surgery | Admitting: Vascular Surgery

## 2015-11-05 ENCOUNTER — Ambulatory Visit (INDEPENDENT_AMBULATORY_CARE_PROVIDER_SITE_OTHER): Payer: Medicare Other | Admitting: Family

## 2015-11-05 VITALS — BP 113/56 | HR 65 | Temp 98.6°F | Resp 18 | Ht 70.0 in | Wt 268.0 lb

## 2015-11-05 DIAGNOSIS — E7800 Pure hypercholesterolemia, unspecified: Secondary | ICD-10-CM | POA: Diagnosis not present

## 2015-11-05 DIAGNOSIS — I714 Abdominal aortic aneurysm, without rupture, unspecified: Secondary | ICD-10-CM

## 2015-11-05 DIAGNOSIS — I10 Essential (primary) hypertension: Secondary | ICD-10-CM | POA: Diagnosis not present

## 2015-11-05 DIAGNOSIS — Z9889 Other specified postprocedural states: Secondary | ICD-10-CM | POA: Diagnosis not present

## 2015-11-05 DIAGNOSIS — Z48812 Encounter for surgical aftercare following surgery on the circulatory system: Secondary | ICD-10-CM | POA: Insufficient documentation

## 2015-11-05 DIAGNOSIS — R0989 Other specified symptoms and signs involving the circulatory and respiratory systems: Secondary | ICD-10-CM | POA: Diagnosis present

## 2015-11-05 DIAGNOSIS — I739 Peripheral vascular disease, unspecified: Secondary | ICD-10-CM | POA: Diagnosis not present

## 2015-11-05 DIAGNOSIS — E119 Type 2 diabetes mellitus without complications: Secondary | ICD-10-CM | POA: Diagnosis not present

## 2015-11-05 NOTE — Progress Notes (Signed)
[facility] [place]   CC: Follow up mild peripheral artery occlusive disease, s/p open repair of AAA, and popliteal artery aneurysm  History of Present Illness [name] [name] is a 82 y.o. male patient of Dr. Arbie Cookey who presents for follow up s/p open resection and grafting of abdominal aortic aneurysm in 1995 by Dr. Arbie Cookey and also treatment of a symptomatic left popliteal artery aneurysm in 1995. He has no new complaints. No back or abdominal pain. No symptoms or claudication and no history of foot or leg ulcers. He was diagnosed with DM about 2016 and was started on medication. Other past medical history includes: hypertension managed with cozaar and Norvasc, hyperlipidemia managed with Zocor. He does take a daily 81 mg aspirin for cardiac protection.   Pt states his walking is limited by knee pain and lumbar spine issues, it does not seem that he walks enough to elicit claudication symptoms.  Pt denies any history of stroke or TIA.   Pt denies any hx of heart problems, states he does not see a cardiologist for any reason.   The patient denies New Medical or Surgical History.  Pt Diabetic: Yes, states his FBS usually runs about 130, states his last A1C was about 7.0, states Dr. Jarold Motto helps pt manage his DM. Pt smoker: former smoker, quit in 1986  Pt meds include: Statin :Yes Betablocker: No ASA: Yes Other anticoagulants/antiplatelets: no  Past Medical History  Diagnosis Date  . Osteoarthritis     left knee  . Pneumonia   . Hypertension   . PVD (peripheral vascular disease) (HCC)   . History of hiatal hernia   . Acid reflux disease   . Hx of urinary tract infection   . Diabetes mellitus, type 2 (HCC)   . Arthritis of back   . History of gout   . Hypercholesterolemia     Social History Social History  Substance Use Topics  . Smoking status: Former Smoker    Quit date: 11/04/1984  . Smokeless tobacco: Never Used  . Alcohol Use: 0.0 oz/week     0 Standard drinks or equivalent per week    Family History Family History  Problem Relation Age of Onset  . Heart attack Mother   . Alzheimer's disease Father     Past Surgical History  Procedure Laterality Date  . Abdominal aortic aneurysm repair    . Femoral-popliteal bypass graft      left  . Appendectomy    . Lumbar spine surgery    . Total knee arthroplasty      bilateral    Allergies  Allergen Reactions  . Indocin [Indomethacin] Other (See Comments)    Antiinflammatory medication for gout ? Indocin > black out per patient    Current Outpatient Prescriptions  Medication Sig Dispense Refill  . allopurinol (ZYLOPRIM) 300 MG tablet Take 150 mg by mouth daily.     Marland Kitchen amLODipine (NORVASC) 5 MG tablet Take 5 mg by mouth daily.     Marland Kitchen aspirin 81 MG tablet Take 81 mg by mouth daily.    . insulin detemir (LEVEMIR) 100 UNIT/ML injection Inject 44 Units into the skin every morning.    Marland Kitchen JANUVIA 100 MG tablet 100 mg daily.    Marland Kitchen losartan (COZAAR) 100 MG tablet Take 100 mg by mouth daily.     . pioglitazone (ACTOS) 15 MG tablet Take 15 mg by mouth daily.     . simvastatin (ZOCOR) 80 MG tablet Take 80 mg by  mouth at bedtime.     . metFORMIN (GLUCOPHAGE-XR) 500 MG 24 hr tablet Take 250 mg by mouth as directed. Reported on 11/05/2015    . omeprazole (PRILOSEC) 20 MG capsule Take 20 mg by mouth daily. Reported on 11/05/2015     No current facility-administered medications for this visit.    ROS: See HPI for pertinent positives and negatives.   Physical Examination  Filed Vitals:   11/05/15 1430  BP: 113/56  Pulse: 65  Temp: 98.6 F (37 C)  Resp: 18  Height:  (1.778 m)  Weight: 268 lb (121.564 kg)  SpO2: 96%   Body mass index is 38.45 kg/(m^2).  General: A&O x 3, WDWN, obese male. Gait: using cane, slow and deliberate Eyes: PERRLA. Pulmonary: Respirations are non labored, CTAB Cardiac: regular rhythm, + murmur.       Carotid Bruits Right Left   Negative  Transmitted cardiac murmur  Aorta is not palpable. Radial pulses: 2+ palpable and =                           VASCULAR EXAM: Extremities without ischemic changes, without Gangrene; without open wounds.                                                                                                          LE Pulses Right Left       FEMORAL  not palpable (obese)  not palpable (obese)        POPLITEAL  not palpable   not palpable       POSTERIOR TIBIAL  not palpable   1+ palpable        DORSALIS PEDIS      ANTERIOR TIBIAL 1+ palpable  2+ palpable    Abdomen: soft, NT, no palpable masses. Skin: no rashes, no ulcers. Musculoskeletal: no muscle wasting or atrophy.  Neurologic: A&O X 3; Appropriate Affect ; SENSATION: normal; MOTOR FUNCTION:  moving all extremities equally, motor strength 5/5 throughout. Speech is fluent/normal.  CN 2-12 intact.    Non-Invasive Vascular Imaging: DATE: 11/05/2015 LOWER EXTREMITY ARTERIAL DUPLEX EVALUATION    INDICATION: PVD    PREVIOUS INTERVENTION(S): Left above knee to below knee bypass graft for aneurysm. Aorto-bifemoral bypass graft 1995    DUPLEX EXAM:     RIGHT  LEFT   Peak Systolic Velocity (cm/s) Ratio (if abnormal) Waveform  Peak Systolic Velocity (cm/s) Ratio (if abnormal) Waveform     Inflow Artery 40  M-B     Proximal Anastomosis 360  Stenotic     Proximal Graft 158  B damp     Mid Graft 62  B      Distal Graft 52  B     Distal Anastomosis 68  M brisk     Outflow Artery 80  B  0.87 Today's ABI / TBI 0.97  0.71 Previous ABI / TBI ( 11/05/15 ) 0.82    Waveform:    M - Monophasic       B - Biphasic  T - Triphasic  If Ankle Brachial Index (ABI) or Toe Brachial Index (TBI) performed, please see complete report     ADDITIONAL FINDINGS:     IMPRESSION: 1. Patent left above knee to below knee bypass graft with increased velocity at the proximal anastomosis suggestive of 50 - 79% stenosis    Compared to the previous exam:   Unable to obtain increased velocity in the left proximal anastomosis      ABI: RIGHT: 0.87 (0.71, 11/05/14), Waveforms: monophasic, TBI: 1.12;  LEFT: 0.97 (0.82), Waveforms: biphasic, TBI: 1.37   ASSESSMENT: [name] [name] is a 82 y.o. male who is s/p open resection and grafting of abdominal aortic aneurysm in 1995 by Dr. Arbie CookeyEarly and also treatment of a symptomatic left popliteal artery aneurysm in 1995. His back and arthritis pain prevent him from walking enough to elicit claudication symptoms.  Today's left LE arterial duplex suggests a patent left above knee to below knee bypass graft with elevated velocity at the proximal anastomosis (360 cm/s) suggestive of 50 - 79% stenosis. However, velocity at the proximal anastomosis on 11/05/14 was 455 cm/s.  ABI's improved in both legs, mild arterial occlusive disease on the right with monophasic waveforms and normal on the left with biphasic waveforms. Both TBI's are normal.    PLAN:   Daily seated leg exercises discussed and demonstrated.  Based on the patient's vascular studies and examination, pt will return to clinic in 1 year with ABI's and left LE arterial duplex. I advised pt to notify us should he develop concerns re the circulation in his legs.   I discussed in depth with the patient the nature of atherosclerosis, and emphasized the importance of maximal medical management including strict control of blood pressure, blood glucose, and lipid levels, obtaining regular exercise, and continued cessation of smoking.  The patient is aware that without maximal medical management the underlying atherosclerotic disease process will progress, limiting the benefit of any interventions.  The patient was given information about PAD including signs, symptoms, treatment, what symptoms should prompt the patient to seek immediate medical care, and risk reduction measures to take.  Charisse MarchSuzanne Kamiyah Kindel, RN, MSN, FNP-C [facility]  [place] Office Phone: [phone]  Clinic MD: Edilia BoDickson  11/05/2015 3:10 PM

## 2015-11-05 NOTE — Patient Instructions (Signed)
Peripheral Vascular Disease Peripheral vascular disease (PVD) is a disease of the blood vessels that are not part of your heart and brain. A simple term for PVD is poor circulation. In most cases, PVD narrows the blood vessels that carry blood from your heart to the rest of your body. This can result in a decreased supply of blood to your arms, legs, and internal organs, like your stomach or kidneys. However, it most often affects a person's lower legs and feet. There are two types of PVD.  Organic PVD. This is the more common type. It is caused by damage to the structure of blood vessels.  Functional PVD. This is caused by conditions that make blood vessels contract and tighten (spasm). Without treatment, PVD tends to get worse over time. PVD can also lead to acute ischemic limb. This is when an arm or limb suddenly has trouble getting enough blood. This is a medical emergency. CAUSES Each type of PVD has many different causes. The most common cause of PVD is buildup of a fatty material (plaque) inside of your arteries (atherosclerosis). Small amounts of plaque can break off from the walls of the blood vessels and become lodged in a smaller artery. This blocks blood flow and can cause acute ischemic limb. Other common causes of PVD include:  Blood clots that form inside of blood vessels.  Injuries to blood vessels.  Diseases that cause inflammation of blood vessels or cause blood vessel spasms.  Health behaviors and health history that increase your risk of developing PVD. RISK FACTORS  You may have a greater risk of PVD if you:  Have a family history of PVD.  Have certain medical conditions, including:  High cholesterol.  Diabetes.  High blood pressure (hypertension).  Coronary heart disease.  Past problems with blood clots.  Past injury, such as burns or a broken bone. These may have damaged blood vessels in your limbs.  Buerger disease. This is caused by inflamed blood  vessels in your hands and feet.  Some forms of arthritis.  Rare birth defects that affect the arteries in your legs.  Use tobacco.  Do not get enough exercise.  Are obese.  Are age 50 or older. SIGNS AND SYMPTOMS  PVD may cause many different symptoms. Your symptoms depend on what part of your body is not getting enough blood. Some common signs and symptoms include:  Cramps in your lower legs. This may be a symptom of poor leg circulation (claudication).  Pain and weakness in your legs while you are physically active that goes away when you rest (intermittent claudication).  Leg pain when at rest.  Leg numbness, tingling, or weakness.  Coldness in a leg or foot, especially when compared with the other leg.  Skin or hair changes. These can include:  Hair loss.  Shiny skin.  Pale or bluish skin.  Thick toenails.  Inability to get or maintain an erection (erectile dysfunction). People with PVD are more prone to developing ulcers and sores on their toes, feet, or legs. These may take longer than normal to heal. DIAGNOSIS Your health care provider may diagnose PVD from your signs and symptoms. The health care provider will also do a physical exam. You may have tests to find out what is causing your PVD and determine its severity. Tests may include:  Blood pressure recordings from your arms and legs and measurements of the strength of your pulses (pulse volume recordings).  Imaging studies using sound waves to take pictures of   the blood flow through your blood vessels (Doppler ultrasound).  Injecting a dye into your blood vessels before having imaging studies using:  X-rays (angiogram or arteriogram).  Computer-generated X-rays (CT angiogram).  A powerful electromagnetic field and a computer (magnetic resonance angiogram or MRA). TREATMENT Treatment for PVD depends on the cause of your condition and the severity of your symptoms. It also depends on your age. Underlying  causes need to be treated and controlled. These include long-lasting (chronic) conditions, such as diabetes, high cholesterol, and high blood pressure. You may need to first try making lifestyle changes and taking medicines. Surgery may be needed if these do not work. Lifestyle changes may include:  Quitting smoking.  Exercising regularly.  Following a low-fat, low-cholesterol diet. Medicines may include:  Blood thinners to prevent blood clots.  Medicines to improve blood flow.  Medicines to improve your blood cholesterol levels. Surgical procedures may include:  A procedure that uses an inflated balloon to open a blocked artery and improve blood flow (angioplasty).  A procedure to put in a tube (stent) to keep a blocked artery open (stent implant).  Surgery to reroute blood flow around a blocked artery (peripheral bypass surgery).  Surgery to remove dead tissue from an infected wound on the affected limb.  Amputation. This is surgical removal of the affected limb. This may be necessary in cases of acute ischemic limb that are not improved through medical or surgical treatments. HOME CARE INSTRUCTIONS  Take medicines only as directed by your health care provider.  Do not use any tobacco products, including cigarettes, chewing tobacco, or electronic cigarettes. If you need help quitting, ask your health care provider.  Lose weight if you are overweight, and maintain a healthy weight as directed by your health care provider.  Eat a diet that is low in fat and cholesterol. If you need help, ask your health care provider.  Exercise regularly. Ask your health care provider to suggest some good activities for you.  Use compression stockings or other mechanical devices as directed by your health care provider.  Take good care of your feet.  Wear comfortable shoes that fit well.  Check your feet often for any cuts or sores. SEEK MEDICAL CARE IF:  You have cramps in your legs  while walking.  You have leg pain when you are at rest.  You have coldness in a leg or foot.  Your skin changes.  You have erectile dysfunction.  You have cuts or sores on your feet that are not healing. SEEK IMMEDIATE MEDICAL CARE IF:  Your arm or leg turns cold and blue.  Your arms or legs become red, warm, swollen, painful, or numb.  You have chest pain or trouble breathing.  You suddenly have weakness in your face, arm, or leg.  You become very confused or lose the ability to speak.  You suddenly have a very bad headache or lose your vision.   This information is not intended to replace advice given to you by your health care provider. Make sure you discuss any questions you have with your health care provider.   Document Released: 07/08/2004 Document Revised: 06/21/2014 Document Reviewed: 11/08/2013 Elsevier Interactive Patient Education 2016 [employer]  

## 2015-11-11 ENCOUNTER — Ambulatory Visit: Payer: Medicare Other | Admitting: Family

## 2015-11-11 ENCOUNTER — Encounter (HOSPITAL_COMMUNITY): Payer: Medicare Other

## 2016-01-09 NOTE — Addendum Note (Signed)
Addended by: Melodye Ped C on: 01/09/2016 11:47 AM   Modules accepted: Orders

## 2016-11-03 ENCOUNTER — Encounter: Payer: Self-pay | Admitting: Family

## 2016-11-16 ENCOUNTER — Ambulatory Visit (INDEPENDENT_AMBULATORY_CARE_PROVIDER_SITE_OTHER)
Admission: RE | Admit: 2016-11-16 | Discharge: 2016-11-16 | Disposition: A | Discharge status: Home / Self Care | Payer: Medicare Other | Source: Ambulatory Visit | Attending: Family | Admitting: Family

## 2016-11-16 ENCOUNTER — Ambulatory Visit (HOSPITAL_COMMUNITY)
Admission: RE | Admit: 2016-11-16 | Discharge: 2016-11-16 | Disposition: A | Discharge status: Home / Self Care | Payer: Medicare Other | Source: Ambulatory Visit | Attending: Family | Admitting: Family

## 2016-11-16 ENCOUNTER — Ambulatory Visit (INDEPENDENT_AMBULATORY_CARE_PROVIDER_SITE_OTHER): Payer: Medicare Other | Admitting: Family

## 2016-11-16 ENCOUNTER — Encounter: Payer: Self-pay | Admitting: Family

## 2016-11-16 VITALS — BP 127/75 | HR 81 | Temp 97.4°F | Resp 20 | Ht 70.0 in | Wt 263.0 lb

## 2016-11-16 DIAGNOSIS — I714 Abdominal aortic aneurysm, without rupture, unspecified: Secondary | ICD-10-CM

## 2016-11-16 DIAGNOSIS — I724 Aneurysm of artery of lower extremity: Secondary | ICD-10-CM | POA: Diagnosis not present

## 2016-11-16 DIAGNOSIS — I739 Peripheral vascular disease, unspecified: Secondary | ICD-10-CM | POA: Diagnosis not present

## 2016-11-16 DIAGNOSIS — Z9889 Other specified postprocedural states: Secondary | ICD-10-CM

## 2016-11-16 NOTE — Progress Notes (Signed)
[facility] [place]   CC: Follow up mild peripheral artery occlusive disease, s/p open repair of AAA, and popliteal artery aneurysm    History of Present Illness [name] [name] is a 83 y.o. male who presents for follow up s/p open resection and grafting of abdominal aortic aneurysm in 1995 by Dr. Arbie Cookey and also treatment of a symptomatic left popliteal artery aneurysm in 1995.   No back or abdominal pain. No symptoms of claudication and no history of foot or leg ulcers.  He was diagnosed with DM about 2016 and was started on medication.  Other past medical history includes: hypertension managed with cozaar and Norvasc, hyperlipidemia managed with Zocor. He does take a daily 81 mg aspirin for cardiac protection.   Pt states his walking is limited by knee pain and lumbar spine issues, it does not seem that he walks enough to elicit claudication symptoms.  Pt denies any history of stroke or TIA.   Pt denies any hx of heart problems, states he does not see a cardiologist for any reason.   No serum creatinine result on file since 2010.    Pt Diabetic: Yes, states his last A1C was about 6.9, states Dr. Jarold Motto helps pt manage his DM. Pt smoker: former smoker, quit in 1986  Pt meds include: Statin :Yes, states he takes it 3-5x/week;  I advised him to let Dr. Jarold Motto know this.   Betablocker: No ASA: Yes, states 2-3x/week, states he forgets to take; I advised him to let Dr. Jarold Motto know this.  Other anticoagulants/antiplatelets: no   Past Medical History:  Diagnosis Date  . Acid reflux disease   . Arthritis of back   . Diabetes mellitus, type 2 (HCC)   . History of gout   . History of hiatal hernia   . Hx of urinary tract infection   . Hypercholesterolemia   . Hypertension   . Osteoarthritis    left knee  . Pneumonia   . PVD (peripheral vascular disease) (HCC)     Social History Social History  Substance Use Topics  . Smoking status: Former  Smoker    Quit date: 11/04/1984  . Smokeless tobacco: Never Used  . Alcohol use 0.0 oz/week    Family History Family History  Problem Relation Age of Onset  . Heart attack Mother   . Alzheimer's disease Father     Past Surgical History:  Procedure Laterality Date  . ABDOMINAL AORTIC ANEURYSM REPAIR    . APPENDECTOMY    . FEMORAL-POPLITEAL BYPASS GRAFT     left  . LUMBAR SPINE SURGERY    . TOTAL KNEE ARTHROPLASTY     bilateral    Allergies  Allergen Reactions  . Indocin [Indomethacin] Other (See Comments)    Antiinflammatory medication for gout ? Indocin > black out per patient    Current Outpatient Prescriptions  Medication Sig Dispense Refill  . allopurinol (ZYLOPRIM) 300 MG tablet Take 150 mg by mouth daily.     Marland Kitchen amLODipine (NORVASC) 5 MG tablet Take 5 mg by mouth daily.     Marland Kitchen aspirin 81 MG tablet Take 81 mg by mouth daily.    . insulin detemir (LEVEMIR) 100 UNIT/ML injection Inject 44 Units into the skin every morning.    Marland Kitchen JANUVIA 100 MG tablet 100 mg daily.    Marland Kitchen losartan (COZAAR) 100 MG tablet Take 100 mg by mouth daily.     . metFORMIN (GLUCOPHAGE-XR) 500 MG 24 hr tablet Take 250 mg by  mouth as directed. Reported on 11/05/2015    . omeprazole (PRILOSEC) 20 MG capsule Take 20 mg by mouth daily. Reported on 11/05/2015    . pioglitazone (ACTOS) 15 MG tablet Take 15 mg by mouth daily.     . simvastatin (ZOCOR) 80 MG tablet Take 80 mg by mouth at bedtime.      No current facility-administered medications for this visit.     ROS: See HPI for pertinent positives and negatives.   Physical Examination  Vitals:   11/16/16 1416 11/16/16 1418  BP: 115/70 127/75  Pulse: 81   Resp: 20   Temp: 97.4 F (36.3 C)   TempSrc: Oral   SpO2: 96%   Weight: 263 lb (119.3 kg)   Height: 5\' 10"  (1.778 m)    Body mass index is 37.74 kg/m.  General: A&O x 3, WDWN, obese male. Gait: using cane, slow and deliberate Eyes: PERRLA. Pulmonary: Respirations are non labored at  rest, but labored with minimal exertion, CTAB Cardiac: regular rhythm, + murmur.       Carotid Bruits Right Left   Negative Transmitted cardiac murmur  Aorta is not palpable. Radial pulses: 2+ palpable and =                           VASCULAR EXAM: Extremities without ischemic changes, without Gangrene; without open wounds.                                                                                                                                                       LE Pulses Right Left       FEMORAL  not palpable (obese)  not palpable (obese)        POPLITEAL  not palpable   not palpable       POSTERIOR TIBIAL  not palpable   1+ palpable        DORSALIS PEDIS      ANTERIOR TIBIAL 1+ palpable  2+ palpable    Abdomen: soft, NT, reducible asymptomatic ventral hernia. Skin: yeast appearing rash under large panus Musculoskeletal: no muscle wasting or atrophy.         Neurologic: A&O X 3; Appropriate Affect ; SENSATION: normal; MOTOR FUNCTION:  moving all extremities equally, motor strength 5/5 throughout. Speech is fluent/normal, CN 2-12 intact.     Non-Invasive Vascular Imaging: DATE: 11/16/2016  Left LE arterial duplex: Patent left above knee to below knee bypass graft with elevated velocity at the proximal anastomosis (423 cm/s)  suggestive of >70% stenosis. However, velocity at the proximal anastomosis on 11/05/14 was 455 cm/s, on 11-05-15 was 360 cm/s.    ABI (Date: 11/16/2016)  R:   ABI: 0.85 (0.87 on 11-05-15),   PT: mono (mono)  DP: mono (mono)  TBI:  0.83 (was 1.12)  L:   ABI: 1.01 (was 0.97),   PT: tri (bi)  DP: bi (bi)  TBI: 0.71 (was 1.37)    ASSESSMENT: [name] [name] is a 83 y.o. male who is s/p open resection and grafting of abdominal aortic aneurysm in 1995 by Dr. Arbie CookeyEarly and also treatment of a symptomatic left popliteal artery aneurysm in 1995. His back and arthritis pain prevent him from walking enough to elicit claudication  symptoms.  There are no signs of ischemia in his feet or legs. The stenosis at the proximal anastomosis of his left leg bypass graft remains stable remains stable. Bilateral ABI remain stable.   Yeast appearing rash in the folds under his large panus; I advised OTC miconazole application topically and to discuss this with his PCP.    PLAN:   Daily seated leg exercises discussed and demonstrated.  Based on the patient's vascular studies and examination, and after discussing with Dr. Arbie CookeyEarly, pt will return to clinic in 1 year with ABI's and left LE arterial duplex. I advised pt to notify us should he develop concerns re the circulation in his legs.    I discussed in depth with the patient the nature of atherosclerosis, and emphasized the importance of maximal medical management including strict control of blood pressure, blood glucose, and lipid levels, obtaining regular exercise, and continued cessation of smoking.  The patient is aware that without maximal medical management the underlying atherosclerotic disease process will progress, limiting the benefit of any interventions.  The patient was given information about PAD including signs, symptoms, treatment, what symptoms should prompt the patient to seek immediate medical care, and risk reduction measures to take.  Charisse MarchSuzanne Nickel, RN, MSN, FNP-C [facility] [place] Office Phone: [phone]  Clinic MD: Early  11/16/16 2:22 PM

## 2016-11-16 NOTE — Patient Instructions (Signed)

## 2016-11-26 ENCOUNTER — Telehealth: Payer: Self-pay | Admitting: Vascular Surgery

## 2016-11-26 NOTE — Telephone Encounter (Signed)
-----   Message from Phillips Odorarol S Pullins, RN sent at 11/24/2016  5:00 PM EDT ----- Regarding: Needs appt. with Dr. Ty HiltsEarly  Contact: [phone]: [phone] Dr. Arbie CookeyEarly would like this pt. scheduled for an office appt. to discuss his symptoms and vasc. results.  He recently saw NP, but needs clarification on what he was told.  Try to schedule this within the next couple of weeks.  Does not need any repeat studies.

## 2016-11-26 NOTE — Telephone Encounter (Signed)
Sched appt 12/28/16 at 8:45. No vm on any number. Mailed appt letter.

## 2016-12-02 NOTE — Addendum Note (Signed)
Addended by: Burton ApleyPETTY, Albirda Shiel A on: 12/02/2016 03:26 PM   Modules accepted: Orders

## 2016-12-17 ENCOUNTER — Encounter: Payer: Self-pay | Admitting: Vascular Surgery

## 2016-12-28 ENCOUNTER — Ambulatory Visit: Payer: Medicare Other | Admitting: Vascular Surgery

## 2017-01-06 ENCOUNTER — Encounter: Payer: Self-pay | Admitting: Vascular Surgery

## 2017-01-25 ENCOUNTER — Encounter: Payer: Self-pay | Admitting: Vascular Surgery

## 2017-01-25 ENCOUNTER — Ambulatory Visit (INDEPENDENT_AMBULATORY_CARE_PROVIDER_SITE_OTHER): Payer: Medicare Other | Admitting: Vascular Surgery

## 2017-01-25 VITALS — BP 107/62 | HR 75 | Temp 98.8°F | Resp 16 | Ht 67.5 in | Wt 266.0 lb

## 2017-01-25 DIAGNOSIS — I739 Peripheral vascular disease, unspecified: Secondary | ICD-10-CM | POA: Diagnosis not present

## 2017-01-25 DIAGNOSIS — I724 Aneurysm of artery of lower extremity: Secondary | ICD-10-CM

## 2017-01-25 DIAGNOSIS — I714 Abdominal aortic aneurysm, without rupture, unspecified: Secondary | ICD-10-CM

## 2017-01-25 NOTE — Progress Notes (Signed)
[facility]  Patient name: [name] [name] MRN: [id number] DOB: [birth year] Sex: male  REASON FOR VISIT: Discussion of recent noninvasive studies from his left above-knee to below-knee popliteal bypass  HPI: [name] [name] is a 83 y.o. male well-known to me from open aneurysm repair and above-knee to below-knee popliteal artery aneurysm repair on 2 separate surgeries 23 years ago. He continues to be and reasonable health. He has a slowed down due to arthritic difficulties. He does not walk to the level of claudication. He recently underwent noninvasive studies in our office and was seen by our nurse practitioner. He is asked to talk with me to further discuss his noninvasive studies.  Past Medical History:  Diagnosis Date  . Acid reflux disease   . Arthritis of back   . Diabetes mellitus, type 2 (HCC)   . History of gout   . History of hiatal hernia   . Hx of urinary tract infection   . Hypercholesterolemia   . Hypertension   . Osteoarthritis    left knee  . Pneumonia   . PVD (peripheral vascular disease) (HCC)     Family History  Problem Relation Age of Onset  . Heart attack Mother   . Alzheimer's disease Father     SOCIAL HISTORY: Social History  Substance Use Topics  . Smoking status: Former Smoker    Quit date: 11/04/1984  . Smokeless tobacco: Never Used  . Alcohol use 0.0 oz/week    Allergies  Allergen Reactions  . Indocin [Indomethacin] Other (See Comments)    Antiinflammatory medication for gout ? Indocin > black out per patient    Current Outpatient Prescriptions  Medication Sig Dispense Refill  . allopurinol (ZYLOPRIM) 300 MG tablet Take 150 mg by mouth daily.     Marland Kitchen amLODipine (NORVASC) 5 MG tablet Take 5 mg by mouth daily.     Marland Kitchen aspirin 81 MG tablet Take 81 mg by mouth daily.    . insulin detemir (LEVEMIR) 100 UNIT/ML injection Inject 40 Units into the skin every morning.     Marland Kitchen JANUVIA 100 MG  tablet 100 mg daily.    Marland Kitchen losartan (COZAAR) 100 MG tablet Take 100 mg by mouth daily.     . metFORMIN (GLUCOPHAGE-XR) 500 MG 24 hr tablet Take 250 mg by mouth as directed. Reported on 11/05/2015    . pioglitazone (ACTOS) 15 MG tablet Take 15 mg by mouth daily.     . simvastatin (ZOCOR) 80 MG tablet Take 80 mg by mouth at bedtime.      No current facility-administered medications for this visit.     REVIEW OF SYSTEMS:  [X]  denotes positive finding, [ ]  denotes negative finding Cardiac  Comments:  Chest pain or chest pressure:    Shortness of breath upon exertion:    Short of breath when lying flat:    Irregular heart rhythm:        Vascular    Pain in calf, thigh, or hip brought on by ambulation:    Pain in feet at night that wakes you up from your sleep:     Blood clot in your veins:    Leg swelling:           PHYSICAL EXAM: Vitals:   01/25/17 1026  BP: 107/62  Pulse: 75  Resp: 16  Temp: 98.8 F (37.1 C)  TempSrc: Oral  SpO2: 95%  Weight: 266 lb (120.7 kg)  Height: 5' 7.5" (1.715 m)  GENERAL: The patient is a well-nourished male, in no acute distress. The vital signs are documented above. CARDIOVASCULAR: Carotid arteries without bruits bilaterally. Palpable popliteal graft pulse on the left PULMONARY: There is good air exchange  MUSCULOSKELETAL: There are no major deformities or cyanosis. NEUROLOGIC: No focal weakness or paresthesias are detected. SKIN: There are no ulcers or rashes noted. PSYCHIATRIC: The patient has a normal affect.  DATA:  Noninvasive studies from 11/16/2016 reviewed. This shows a normal ankle arm index on the left and slightly diminished at 0.85 on the right. He has triphasic waveforms on the left leg. Duplex imaging did show some elevated velocities at the proximal anastomosis of his left above-knee below-knee popliteal bypass  MEDICAL ISSUES: Patient does not have any symptoms related to his lower from the bypass. I did explain by duplex that  he does have some narrowing in the proximal anastomosis. He is 22 years out from his surgery. I would not recommend any invasive evaluation for this. I did explain that this puts him at slight risk for thrombosis of his graft but would certainly feel that intervention would be a higher risk. He is comfortable with this and will continue to see us in a yearly basis for noninvasive studies    Larina Earthlyodd F. Liv Rallis, MD [facility] Office Tel [phone] Pager [phone]

## 2017-01-31 NOTE — Addendum Note (Signed)
Addended by: Burton Apley A on: 01/31/2017 04:44 PM   Modules accepted: Orders

## 2017-04-14 ENCOUNTER — Other Ambulatory Visit: Payer: Self-pay | Admitting: Internal Medicine

## 2017-04-14 DIAGNOSIS — K8020 Calculus of gallbladder without cholecystitis without obstruction: Secondary | ICD-10-CM

## 2017-04-29 ENCOUNTER — Ambulatory Visit
Admission: RE | Admit: 2017-04-29 | Discharge: 2017-04-29 | Disposition: A | Discharge status: Home / Self Care | Payer: Medicare Other | Source: Ambulatory Visit | Attending: Internal Medicine | Admitting: Internal Medicine

## 2017-04-29 DIAGNOSIS — K8020 Calculus of gallbladder without cholecystitis without obstruction: Secondary | ICD-10-CM

## 2017-05-12 ENCOUNTER — Other Ambulatory Visit (HOSPITAL_COMMUNITY): Payer: Self-pay | Admitting: Internal Medicine

## 2017-05-12 DIAGNOSIS — K8020 Calculus of gallbladder without cholecystitis without obstruction: Secondary | ICD-10-CM

## 2017-05-25 ENCOUNTER — Ambulatory Visit (HOSPITAL_COMMUNITY): Payer: Medicare Other

## 2017-05-27 ENCOUNTER — Ambulatory Visit (HOSPITAL_COMMUNITY): Payer: Medicare Other

## 2017-06-15 ENCOUNTER — Ambulatory Visit (HOSPITAL_COMMUNITY): Payer: Medicare Other

## 2017-06-22 ENCOUNTER — Ambulatory Visit (HOSPITAL_COMMUNITY)
Admission: RE | Admit: 2017-06-22 | Discharge: 2017-06-22 | Disposition: A | Discharge status: Home / Self Care | Payer: Medicare Other | Source: Ambulatory Visit | Attending: Internal Medicine | Admitting: Internal Medicine

## 2017-06-22 ENCOUNTER — Encounter (HOSPITAL_COMMUNITY): Payer: Self-pay

## 2017-11-22 ENCOUNTER — Encounter (HOSPITAL_COMMUNITY): Payer: Medicare Other

## 2017-11-22 ENCOUNTER — Ambulatory Visit: Payer: Medicare Other | Admitting: Family

## 2017-11-22 ENCOUNTER — Other Ambulatory Visit (HOSPITAL_COMMUNITY): Payer: Medicare Other

## 2017-12-20 ENCOUNTER — Other Ambulatory Visit (HOSPITAL_COMMUNITY): Payer: Self-pay | Admitting: Internal Medicine

## 2017-12-20 DIAGNOSIS — R110 Nausea: Secondary | ICD-10-CM

## 2018-01-04 ENCOUNTER — Ambulatory Visit (HOSPITAL_COMMUNITY)
Admission: RE | Admit: 2018-01-04 | Discharge: 2018-01-04 | Disposition: A | Discharge status: Home / Self Care | Payer: Medicare Other | Source: Ambulatory Visit | Attending: Internal Medicine | Admitting: Internal Medicine

## 2018-01-04 DIAGNOSIS — R110 Nausea: Secondary | ICD-10-CM | POA: Diagnosis not present

## 2018-01-25 ENCOUNTER — Telehealth: Payer: Self-pay | Admitting: Vascular Surgery

## 2018-01-25 NOTE — Telephone Encounter (Signed)
Sched. Appt. Spoke with pt. 1 yr f/u N/P 1pm ultra sound 2pm ultra sound 3:15pm N/P

## 2018-02-08 ENCOUNTER — Other Ambulatory Visit: Payer: Self-pay

## 2018-02-08 DIAGNOSIS — I724 Aneurysm of artery of lower extremity: Secondary | ICD-10-CM

## 2018-02-08 DIAGNOSIS — I739 Peripheral vascular disease, unspecified: Secondary | ICD-10-CM

## 2018-02-24 ENCOUNTER — Ambulatory Visit: Payer: Medicare Other | Admitting: Family

## 2018-02-24 ENCOUNTER — Other Ambulatory Visit (HOSPITAL_COMMUNITY): Payer: Medicare Other

## 2018-02-24 ENCOUNTER — Encounter (HOSPITAL_COMMUNITY): Payer: Medicare Other

## 2018-03-06 ENCOUNTER — Ambulatory Visit (HOSPITAL_COMMUNITY): Payer: Medicare Other | Attending: Surgery

## 2018-03-06 ENCOUNTER — Ambulatory Visit: Payer: Medicare Other | Admitting: Family

## 2018-03-06 ENCOUNTER — Ambulatory Visit (HOSPITAL_COMMUNITY): Payer: Medicare Other

## 2018-04-07 ENCOUNTER — Ambulatory Visit (HOSPITAL_COMMUNITY)
Admission: RE | Admit: 2018-04-07 | Discharge: 2018-04-07 | Disposition: A | Discharge status: Home / Self Care | Payer: Medicare Other | Source: Ambulatory Visit | Attending: Family | Admitting: Family

## 2018-04-07 ENCOUNTER — Ambulatory Visit (INDEPENDENT_AMBULATORY_CARE_PROVIDER_SITE_OTHER)
Admission: RE | Admit: 2018-04-07 | Discharge: 2018-04-07 | Disposition: A | Discharge status: Home / Self Care | Payer: Medicare Other | Source: Ambulatory Visit | Attending: Family | Admitting: Family

## 2018-04-07 ENCOUNTER — Encounter: Payer: Self-pay | Admitting: Family

## 2018-04-07 ENCOUNTER — Ambulatory Visit: Payer: Medicare Other | Admitting: Family

## 2018-04-07 VITALS — BP 139/73 | HR 69 | Temp 97.6°F | Resp 18 | Ht 67.5 in | Wt 252.0 lb

## 2018-04-07 DIAGNOSIS — I739 Peripheral vascular disease, unspecified: Secondary | ICD-10-CM | POA: Insufficient documentation

## 2018-04-07 DIAGNOSIS — I714 Abdominal aortic aneurysm, without rupture, unspecified: Secondary | ICD-10-CM

## 2018-04-07 DIAGNOSIS — I724 Aneurysm of artery of lower extremity: Secondary | ICD-10-CM | POA: Insufficient documentation

## 2018-04-07 DIAGNOSIS — Z9889 Other specified postprocedural states: Secondary | ICD-10-CM

## 2018-04-07 DIAGNOSIS — R0989 Other specified symptoms and signs involving the circulatory and respiratory systems: Secondary | ICD-10-CM

## 2018-04-07 NOTE — Progress Notes (Signed)
Patient is here for one year follow up.

## 2018-04-07 NOTE — Patient Instructions (Signed)
Peripheral Vascular Disease Peripheral vascular disease (PVD) is a disease of the blood vessels that are not part of your heart and brain. A simple term for PVD is poor circulation. In most cases, PVD narrows the blood vessels that carry blood from your heart to the rest of your body. This can result in a decreased supply of blood to your arms, legs, and internal organs, like your stomach or kidneys. However, it most often affects a person's lower legs and feet. There are two types of PVD.  Organic PVD. This is the more common type. It is caused by damage to the structure of blood vessels.  Functional PVD. This is caused by conditions that make blood vessels contract and tighten (spasm).  Without treatment, PVD tends to get worse over time. PVD can also lead to acute ischemic limb. This is when an arm or limb suddenly has trouble getting enough blood. This is a medical emergency. Follow these instructions at home:  Take medicines only as told by your doctor.  Do not use any tobacco products, including cigarettes, chewing tobacco, or electronic cigarettes. If you need help quitting, ask your doctor.  Lose weight if you are overweight, and maintain a healthy weight as told by your doctor.  Eat a diet that is low in fat and cholesterol. If you need help, ask your doctor.  Exercise regularly. Ask your doctor for some good activities for you.  Take good care of your feet. ? Wear comfortable shoes that fit well. ? Check your feet often for any cuts or sores. Contact a doctor if:  You have cramps in your legs while walking.  You have leg pain when you are at rest.  You have coldness in a leg or foot.  Your skin changes.  You are unable to get or have an erection (erectile dysfunction).  You have cuts or sores on your feet that are not healing. Get help right away if:  Your arm or leg turns cold and blue.  Your arms or legs become red, warm, swollen, painful, or numb.  You have  chest pain or trouble breathing.  You suddenly have weakness in your face, arm, or leg.  You become very confused or you cannot speak.  You suddenly have a very bad headache.  You suddenly cannot see. This information is not intended to replace advice given to you by your health care provider. Make sure you discuss any questions you have with your health care provider. Document Released: 08/25/2009 Document Revised: 11/06/2015 Document Reviewed: 11/08/2013 Elsevier Interactive Patient Education  2017 [employer] Inc[employer]       Stroke Prevention Some health problems and behaviors may make it more likely for you to have a stroke. Below are ways to lessen your risk of having a stroke.  Be active for at least 30 minutes on most or all days.  Do not smoke. Try not to be around others who smoke.  Do not drink too much alcohol. ? Do not have more than 2 drinks a day if you are a man. ? Do not have more than 1 drink a day if you are a woman and are not pregnant.  Eat healthy foods, such as fruits and vegetables. If you were put on a specific diet, follow the diet as told.  Keep your cholesterol levels under control through diet and medicines. Look for foods that are low in saturated fat, trans fat, cholesterol, and are high in fiber.  If you have diabetes, follow   all diet plans and take your medicine as told.  Ask your doctor if you need treatment to lower your blood pressure. If you have high blood pressure (hypertension), follow all diet plans and take your medicine as told by your doctor.  If you are 18-39 years old, have your blood pressure checked every 3-5 years. If you are age 40 or older, have your blood pressure checked every year.  Keep a healthy weight. Eat foods that are low in calories, salt, saturated fat, trans fat, and cholesterol.  Do not take drugs.  Avoid birth control pills, if this applies. Talk to your doctor about the risks of taking birth control pills.  Talk to  your doctor if you have sleep problems (sleep apnea).  Take all medicine as told by your doctor. ? You may be told to take aspirin or blood thinner medicine. Take this medicine as told by your doctor. ? Understand your medicine instructions.  Make sure any other conditions you have are being taken care of.  Get help right away if:  You suddenly lose feeling (you feel numb) or have weakness in your face, arm, or leg.  Your face or eyelid hangs down to one side.  You suddenly feel confused.  You have trouble talking (aphasia) or understanding what people are saying.  You suddenly have trouble seeing in one or both eyes.  You suddenly have trouble walking.  You are dizzy.  You lose your balance or your movements are clumsy (uncoordinated).  You suddenly have a very bad headache and you do not know the cause.  You have new chest pain.  Your heart feels like it is fluttering or skipping a beat (irregular heartbeat). Do not wait to see if the symptoms above go away. Get help right away. Call your local emergency services (911 in [place] Do not drive yourself to the hospital. This information is not intended to replace advice given to you by your health care provider. Make sure you discuss any questions you have with your health care provider. Document Released: 11/30/2011 Document Revised: 11/06/2015 Document Reviewed: 12/01/2012 Elsevier Interactive Patient Education  2018 Else[employer] Inc.  

## 2018-04-07 NOTE — Progress Notes (Signed)
[facility] [place]   CC: Follow up peripheral artery occlusive disease  History of Present Illness [name] [name] is a 84 y.o. male presents for follow up s/p open resection and grafting of abdominal aortic aneurysm in 1995 by Dr. Arbie Cookey and also treatment of a symptomatic left popliteal artery aneurysm in 1995.    No back or abdominal pain. No symptoms of claudication and no history of foot or leg ulcers.  He was diagnosed with DM about 2016 and was started on medication.   Pt states his walking is limited by knee pain and lumbar spine issues, it does not seem that he walks enough to elicit claudication symptoms.  Pt denies any history of stroke or TIA.   Pt denies any hx of heart problems, states he does not see a cardiologist for any reason.    Pt Diabetic: Yes, states his last A1C was about 6.5, states Dr. Jarold Motto helps pt manage his DM. Pt smoker: former smoker, quit in 1986  Pt meds include: Statin :Yes, states he takes it 3-5x/week;  I advised him to let Dr. Jarold Motto know this.   Betablocker: No ASA: Yes, states 2-3x/week, states he forgets to take; I advised him to let Dr. Jarold Motto know this.  Other anticoagulants/antiplatelets: no    Past Medical History:  Diagnosis Date  . Acid reflux disease   . Arthritis of back   . Diabetes mellitus, type 2 (HCC)   . History of gout   . History of hiatal hernia   . Hx of urinary tract infection   . Hypercholesterolemia   . Hypertension   . Osteoarthritis    left knee  . Pneumonia   . PVD (peripheral vascular disease) (HCC)     Social History Social History   Tobacco Use  . Smoking status: Former Smoker    Last attempt to quit: 11/04/1984    Years since quitting: 33.4  . Smokeless tobacco: Never Used  Substance Use Topics  . Alcohol use: Yes    Alcohol/week: 0.0 standard drinks  . Drug use: No    Family History Family History  Problem Relation Age of Onset  . Heart attack Mother    . Alzheimer's disease Father     Past Surgical History:  Procedure Laterality Date  . ABDOMINAL AORTIC ANEURYSM REPAIR    . APPENDECTOMY    . FEMORAL-POPLITEAL BYPASS GRAFT     left  . LUMBAR SPINE SURGERY    . TOTAL KNEE ARTHROPLASTY     bilateral    Allergies  Allergen Reactions  . Indocin [Indomethacin] Other (See Comments)    Antiinflammatory medication for gout ? Indocin > black out per patient    Current Outpatient Medications  Medication Sig Dispense Refill  . allopurinol (ZYLOPRIM) 300 MG tablet Take 150 mg by mouth daily.     Marland Kitchen amLODipine (NORVASC) 5 MG tablet Take 5 mg by mouth daily.     Marland Kitchen aspirin 81 MG tablet Take 81 mg by mouth daily.    . insulin detemir (LEVEMIR) 100 UNIT/ML injection Inject 40 Units into the skin every morning.     Marland Kitchen losartan (COZAAR) 100 MG tablet Take 100 mg by mouth daily.     . pioglitazone (ACTOS) 15 MG tablet Take 15 mg by mouth daily.     . simvastatin (ZOCOR) 80 MG tablet Take 80 mg by mouth at bedtime.     Marland Kitchen JANUVIA 100 MG tablet 100 mg daily.    . metFORMIN (  GLUCOPHAGE-XR) 500 MG 24 hr tablet Take 250 mg by mouth as directed. Reported on 11/05/2015     No current facility-administered medications for this visit.     ROS: See HPI for pertinent positives and negatives.   Physical Examination  Vitals:   04/07/18 1511  BP: 139/73  Pulse: 69  Resp: 18  Temp: 97.6 F (36.4 C)  TempSrc: Oral  SpO2: 95%  Weight: 252 lb (114.3 kg)  Height: 5' 7.5" (1.715 m)   Body mass index is 38.89 kg/m.  General: A&O x 3, WDWN, obese male. Gait: using cane, slow, steady HENT: No gross abnormalities.  Eyes: PERRLA. Pulmonary: Respirations are non labored at rest, but labored with minimal exertion, CTAB Cardiac: regular rhythm and rate, +murmur.         Carotid Bruits Right Left   Positive vs transmitted cardiac murmur Positive vs transmitted cardiac murmur   Radial pulses are 2+ palpable bilaterally   Adominal aortic pulse is not  palpable                         VASCULAR EXAM: Extremities without ischemic changes, without Gangrene; without open wounds.                                                                                                          LE Pulses Right Left       FEMORAL  not palpable (obese, seated in chair)  not palpable        POPLITEAL  not palpable   not palpable       POSTERIOR TIBIAL  not palpable   not palpable        DORSALIS PEDIS      ANTERIOR TIBIAL 1+ palpable  1+ palpable    Abdomen: soft, NT, no palpable masses. Large panus Skin: no rashes, no cellulitis, no ulcers noted. Musculoskeletal: no muscle wasting or atrophy.  Neurologic: A&O X 3; appropriate affect, Sensation is normal; MOTOR FUNCTION:  moving all extremities equally, motor strength 5/5 throughout. Speech is fluent/normal. CN 2-12 intact. Psychiatric: Thought content is normal, mood appropriate for clinical situation.    ASSESSMENT: [name] is a 84 y.o. male who iss/p open resection and grafting of abdominal aortic aneurysm in 1995 by Dr. Arbie Cookey and also treatment of a symptomatic left popliteal artery aneurysm in 1995 with a bypass graft.  His back and arthritis pain prevent him from walking enough to elicit claudication symptoms.  There are no signs of ischemia in his feet or legs. The stenosis at the proximal anastomosis of his left leg bypass graft seems improved (297 cm/c today, was 423 cm/s)   DATA  Left LE Arterial Duplex (04-07-18): Highest velocity is 297 cm/s, was higher in previous duplex (360-455 cm/s) Bi and triphasic waveforms to distal peroneal (monophasic), and at the proximal graft (monophasic).    ABI (Date: 04/07/2018):  R:   ABI: 1.04 (was 0.85 on 11-16-16),   PT: bi (was mono)  DP: bi (was mono)  TBI:  0.60, toe  pressure 66, (was 0.83)  L:   ABI: 1.16 (was 1.01),   PT: bi (was tri)  DP: bi (was bi)  TBI: 0.77, toe pressure 85, (was 0.71) ABI in the right improved  to normal from mild disease, biphasic waveforms. ABI in the left remains normal with biphasic waveforms.  Decline in bilateral TBI.     PLAN:  Daily seated leg exercises discussed and demonstrated.  Based on the patient's vascular studies and examination, pt will return to clinic in 1 year with ABI's, left LE arterial duplex, and carotid duplex (carotid bruit, no carotid duplex results on file, no hx of stroke or TIA).   I advised pt to notify us should he develop concerns re the circulation in his legs.    I discussed in depth with the patient the nature of atherosclerosis, and emphasized the importance of maximal medical management including strict control of blood pressure, blood glucose, and lipid levels, obtaining regular exercise, and continued cessation of smoking.  The patient is aware that without maximal medical management the underlying atherosclerotic disease process will progress, limiting the benefit of any interventions.  The patient was given information about PAD including signs, symptoms, treatment, what symptoms should prompt the patient to seek immediate medical care, and risk reduction measures to take.  Charisse March, RN, MSN, FNP-C [facility] Phone: [phone]  Clinic MD: Randie Heinz  04/07/18 3:36 PM

## 2018-04-17 ENCOUNTER — Encounter: Payer: Self-pay | Admitting: Cardiology

## 2018-05-01 ENCOUNTER — Other Ambulatory Visit (HOSPITAL_COMMUNITY): Payer: Self-pay | Admitting: Internal Medicine

## 2018-05-01 DIAGNOSIS — R110 Nausea: Secondary | ICD-10-CM

## 2018-05-01 DIAGNOSIS — K8020 Calculus of gallbladder without cholecystitis without obstruction: Secondary | ICD-10-CM

## 2018-05-18 ENCOUNTER — Encounter (HOSPITAL_COMMUNITY)
Admission: RE | Admit: 2018-05-18 | Discharge: 2018-05-18 | Disposition: A | Discharge status: Home / Self Care | Payer: Medicare Other | Source: Ambulatory Visit | Attending: Internal Medicine | Admitting: Internal Medicine

## 2018-05-18 DIAGNOSIS — R110 Nausea: Secondary | ICD-10-CM | POA: Insufficient documentation

## 2018-05-18 DIAGNOSIS — K8020 Calculus of gallbladder without cholecystitis without obstruction: Secondary | ICD-10-CM | POA: Insufficient documentation

## 2018-05-18 MED ORDER — TECHNETIUM TC 99M MEBROFENIN IV KIT
5.0000 | PACK | Freq: Once | INTRAVENOUS | Status: AC
  Administered 2018-05-18: 5 via INTRAVENOUS

## 2018-05-25 NOTE — Progress Notes (Signed)
Cardiology Office Note   Date:  05/26/2018   ID:  [name] Oharaed Remo, DOB [birth year], MRN [id number]  PCP:  Jarome MatinPaterson, Daniel, MD  Cardiologist:    [place]az[place], MD   Chief Complaint  Patient presents with  . Chest Pain      History of Present Illness: [name] [name] is a 84 y.o. male who is seen at the request of Dr. Eloise HarmanPaterson for evaluation of  chest pain. He has a history of DM type 2, HTN, HLD, and PAD. He is s/p remote AAA grafting and fem-pop bypass in 1995 for popliteal aneurysm. He was seen by me in 2013 for atypical chest pain and a murmur. A Myoview study was normal. Echo showed mild aortic stenosis with normal LV function.   According to Dr Silvano RuskPaterson's notes the patient noted chest pain on exertion. He tells me he only has a localized twinge in his left lower anterior chest below his breast once every 2-3 weeks. This is fleeting lasting less than a second. No other associated symptoms. Denies dyspnea, palpitations, dizziness. Activity is limited by bad knees and he walks with a cane.   He has been experiencing protracted nausea when he gets up in the morning. Has known gallstones and HIDA scan done yesterday was abnormal with GB EF 14%. Planning to have surgery.    Past Medical History:  Diagnosis Date  . Acid reflux disease   . Arthritis of back   . Diabetes mellitus, type 2 (HCC)   . History of gout   . History of hiatal hernia   . Hx of urinary tract infection   . Hypercholesterolemia   . Hypertension   . Osteoarthritis    left knee  . Pneumonia   . PVD (peripheral vascular disease) (HCC)     Past Surgical History:  Procedure Laterality Date  . ABDOMINAL AORTIC ANEURYSM REPAIR    . APPENDECTOMY    . FEMORAL-POPLITEAL BYPASS GRAFT     left  . LUMBAR SPINE SURGERY    . TOTAL KNEE ARTHROPLASTY     bilateral     Current Outpatient Medications  Medication Sig Dispense Refill  . allopurinol (ZYLOPRIM) 300 MG tablet Take 150 mg by mouth daily.     Marland Kitchen. amLODipine  (NORVASC) 5 MG tablet Take 5 mg by mouth daily.     Marland Kitchen. aspirin 81 MG tablet Take 81 mg by mouth daily.    . insulin detemir (LEVEMIR) 100 UNIT/ML injection Inject 40 Units into the skin every morning.     Marland Kitchen. losartan (COZAAR) 100 MG tablet Take 100 mg by mouth daily.     . pioglitazone (ACTOS) 15 MG tablet Take 15 mg by mouth daily.     . simvastatin (ZOCOR) 80 MG tablet Take 80 mg by mouth at bedtime.      No current facility-administered medications for this visit.     Allergies:   Indocin [indomethacin]    Social History:  The patient  reports that he quit smoking about 33 years ago. He has never used smokeless tobacco. He reports current alcohol use. He reports that he does not use drugs.   Family History:  The patient's family history includes Alzheimer's disease in his father; Cancer in his sister; Heart attack in his mother.    ROS:  Please see the history of present illness.   Otherwise, review of systems are positive for none.   All other systems are reviewed and negative.    PHYSICAL EXAM: VS:  BP  116/63   Pulse 75   Ht 5\' 10"  (1.778 m)   Wt 247 lb 9.6 oz (112.3 kg)   BMI 35.53 kg/m  , BMI Body mass index is 35.53 kg/m. GEN: Well nourished, obese, elderly WM, in no acute distress  HEENT: normal  Neck: no JVD, carotid bruits, or masses Cardiac: RRR; no rubs, or gallops,no edema. Gr 2/6 harsh systolic murmur RUSB radiating to apex.  Respiratory:  clear to auscultation bilaterally, normal work of breathing GI: soft, nontender, nondistended, + BS MS: no deformity or atrophy  Skin: warm and dry, no rash Neuro:  Strength and sensation are intact Psych: euthymic mood, full affect   EKG:  EKG is ordered today. The ekg ordered today demonstrates NSR with normal Ecg. I have personally reviewed and interpreted this study.    Recent Labs: No results found for requested labs within last 8760 hours.    Lipid Panel No results found for: CHOL, TRIG, HDL, CHOLHDL, VLDL,  LDLCALC, LDLDIRECT   Dated 04/17/18: cholesterol 182, triglycerides 169, HDL 36, LDL 112. A1c 5.6%. Hgb 12.2. creatinine 1.8. ALT normal  Lab Results  Component Value Date   CREATININE 1.40 08/11/2008   CREATININE 1.56 (H) 08/10/2008   CREATININE 1.90 (H) 08/09/2008    Wt Readings from Last 3 Encounters:  05/26/18 247 lb 9.6 oz (112.3 kg)  04/07/18 252 lb (114.3 kg)  01/25/17 266 lb (120.7 kg)      Other studies Reviewed: Additional studies/ records that were reviewed today include:   Echo 06/13/12: Study Conclusions  - Left ventricle: The cavity size was normal. Wall thickness was normal. Systolic function was normal. The estimated ejection fraction was in the range of 55% to 60%. There was an increased relative contribution of atrial contraction to ventricular filling. - Aortic valve: There was mild stenosis. Mean gradient: 20mm Hg (S). Peak gradient: 31mm Hg (S). - Pulmonary arteries: PA peak pressure: 31mm Hg (S).  Myoview 06/27/12: normal study.    ASSESSMENT AND PLAN:  1.  Chest pain. Very fleeting and clearly not associated with exertion. Not consistent with angina. Prior Myoview in 2014 was normal. Ecg is normal. No further ischemic work up necessary 2. Aortic stenosis. Mild in 2013. Has murmur. Recommend repeat Echo. As long as it is not severe we will clear him for gallbladder surgery.  3. DM type 2 well controlled.  4. HLD on Zocor.  5. Gallstones with biliary colic.  6. HTN controlled.    Current medicines are reviewed at length with the patient today.  The patient does not have concerns regarding medicines.  The following changes have been made:  no change  Labs/ tests ordered today include:   Orders Placed This Encounter  Procedures  . EKG 12-Lead  . ECHOCARDIOGRAM COMPLETE     Disposition:   FU with me PRN   Signed,  [place], MD  05/26/2018 2:13 PM    [facility] [address] [place], [place],  [address] Phone [phone], Fax [phone]

## 2018-05-25 NOTE — H&P (View-Only) (Signed)
  Cardiology Office Note   Date:  05/26/2018   ID:  Celestine Hunkins, DOB 04/20/1932, MRN [id number]  PCP:  Paterson, Daniel, MD  Cardiologist:   Lorimer Tiberio, MD   Chief Complaint  Patient presents with  . Chest Pain      History of Present Illness: Benjermin Jeffries is a 84 y.o. male who is seen at the request of Dr. Paterson for evaluation of  chest pain. He has a history of DM type 2, HTN, HLD, and PAD. He is s/p remote AAA grafting and fem-pop bypass in 1995 for popliteal aneurysm. He was seen by me in 2013 for atypical chest pain and a murmur. A Myoview study was normal. Echo showed mild aortic stenosis with normal LV function.   According to Dr Paterson's notes the patient noted chest pain on exertion. He tells me he only has a localized twinge in his left lower anterior chest below his breast once every 2-3 weeks. This is fleeting lasting less than a second. No other associated symptoms. Denies dyspnea, palpitations, dizziness. Activity is limited by bad knees and he walks with a cane.   He has been experiencing protracted nausea when he gets up in the morning. Has known gallstones and HIDA scan done yesterday was abnormal with GB EF 14%. Planning to have surgery.    Past Medical History:  Diagnosis Date  . Acid reflux disease   . Arthritis of back   . Diabetes mellitus, type 2 (HCC)   . History of gout   . History of hiatal hernia   . Hx of urinary tract infection   . Hypercholesterolemia   . Hypertension   . Osteoarthritis    left knee  . Pneumonia   . PVD (peripheral vascular disease) (HCC)     Past Surgical History:  Procedure Laterality Date  . ABDOMINAL AORTIC ANEURYSM REPAIR    . APPENDECTOMY    . FEMORAL-POPLITEAL BYPASS GRAFT     left  . LUMBAR SPINE SURGERY    . TOTAL KNEE ARTHROPLASTY     bilateral     Current Outpatient Medications  Medication Sig Dispense Refill  . allopurinol (ZYLOPRIM) 300 MG tablet Take 150 mg by mouth daily.     . amLODipine  (NORVASC) 5 MG tablet Take 5 mg by mouth daily.     . aspirin 81 MG tablet Take 81 mg by mouth daily.    . insulin detemir (LEVEMIR) 100 UNIT/ML injection Inject 40 Units into the skin every morning.     . losartan (COZAAR) 100 MG tablet Take 100 mg by mouth daily.     . pioglitazone (ACTOS) 15 MG tablet Take 15 mg by mouth daily.     . simvastatin (ZOCOR) 80 MG tablet Take 80 mg by mouth at bedtime.      No current facility-administered medications for this visit.     Allergies:   Indocin [indomethacin]    Social History:  The patient  reports that he quit smoking about 33 years ago. He has never used smokeless tobacco. He reports current alcohol use. He reports that he does not use drugs.   Family History:  The patient's family history includes Alzheimer's disease in his father; Cancer in his sister; Heart attack in his mother.    ROS:  Please see the history of present illness.   Otherwise, review of systems are positive for none.   All other systems are reviewed and negative.    PHYSICAL EXAM: VS:  BP   116/63   Pulse 75   Ht 5' 10" (1.778 m)   Wt 247 lb 9.6 oz (112.3 kg)   BMI 35.53 kg/m  , BMI Body mass index is 35.53 kg/m. GEN: Well nourished, obese, elderly WM, in no acute distress  HEENT: normal  Neck: no JVD, carotid bruits, or masses Cardiac: RRR; no rubs, or gallops,no edema. Gr 2/6 harsh systolic murmur RUSB radiating to apex.  Respiratory:  clear to auscultation bilaterally, normal work of breathing GI: soft, nontender, nondistended, + BS MS: no deformity or atrophy  Skin: warm and dry, no rash Neuro:  Strength and sensation are intact Psych: euthymic mood, full affect   EKG:  EKG is ordered today. The ekg ordered today demonstrates NSR with normal Ecg. I have personally reviewed and interpreted this study.    Recent Labs: No results found for requested labs within last 8760 hours.    Lipid Panel No results found for: CHOL, TRIG, HDL, CHOLHDL, VLDL,  LDLCALC, LDLDIRECT   Dated 04/17/18: cholesterol 182, triglycerides 169, HDL 36, LDL 112. A1c 5.6%. Hgb 12.2. creatinine 1.8. ALT normal  Lab Results  Component Value Date   CREATININE 1.40 08/11/2008   CREATININE 1.56 (H) 08/10/2008   CREATININE 1.90 (H) 08/09/2008    Wt Readings from Last 3 Encounters:  05/26/18 247 lb 9.6 oz (112.3 kg)  04/07/18 252 lb (114.3 kg)  01/25/17 266 lb (120.7 kg)      Other studies Reviewed: Additional studies/ records that were reviewed today include:   Echo 06/13/12: Study Conclusions  - Left ventricle: The cavity size was normal. Wall thickness was normal. Systolic function was normal. The estimated ejection fraction was in the range of 55% to 60%. There was an increased relative contribution of atrial contraction to ventricular filling. - Aortic valve: There was mild stenosis. Mean gradient: 20mm Hg (S). Peak gradient: 31mm Hg (S). - Pulmonary arteries: PA peak pressure: 31mm Hg (S).  Myoview 06/27/12: normal study.    ASSESSMENT AND PLAN:  1.  Chest pain. Very fleeting and clearly not associated with exertion. Not consistent with angina. Prior Myoview in 2014 was normal. Ecg is normal. No further ischemic work up necessary 2. Aortic stenosis. Mild in 2013. Has murmur. Recommend repeat Echo. As long as it is not severe we will clear him for gallbladder surgery.  3. DM type 2 well controlled.  4. HLD on Zocor.  5. Gallstones with biliary colic.  6. HTN controlled.    Current medicines are reviewed at length with the patient today.  The patient does not have concerns regarding medicines.  The following changes have been made:  no change  Labs/ tests ordered today include:   Orders Placed This Encounter  Procedures  . EKG 12-Lead  . ECHOCARDIOGRAM COMPLETE     Disposition:   FU with me PRN   Signed, Ebubechukwu Jedlicka, MD  05/26/2018 2:13 PM    [facility] [address], [place], [place],  [address] Phone [phone], Fax [phone]   

## 2018-05-26 ENCOUNTER — Encounter: Payer: Self-pay | Admitting: Cardiology

## 2018-05-26 ENCOUNTER — Ambulatory Visit: Payer: Medicare Other | Admitting: Cardiology

## 2018-05-26 VITALS — BP 116/63 | HR 75 | Ht 70.0 in | Wt 247.6 lb

## 2018-05-26 DIAGNOSIS — R011 Cardiac murmur, unspecified: Secondary | ICD-10-CM

## 2018-05-26 DIAGNOSIS — I350 Nonrheumatic aortic (valve) stenosis: Secondary | ICD-10-CM | POA: Diagnosis not present

## 2018-05-26 NOTE — Patient Instructions (Signed)
Medication Instructions:  Continue same medications If you need a refill on your cardiac medications before your next appointment, please call your pharmacy.   Lab work: None ordered   Testing/Procedures: Schedule Echo soon needs surgical clearance  Follow-Up: At [facility], you and your health needs are our priority.  As part of our continuing mission to provide you with exceptional heart care, we have created designated Provider Care Teams.  These Care Teams include your primary Cardiologist (physician) and Advanced Practice Providers (APPs -  Physician Assistants and Nurse Practitioners) who all work together to provide you with the care you need, when you need it. . Follow up will depend on echo results

## 2018-05-30 ENCOUNTER — Other Ambulatory Visit: Payer: Self-pay

## 2018-05-30 ENCOUNTER — Ambulatory Visit (HOSPITAL_COMMUNITY): Payer: Medicare Other | Attending: Cardiovascular Disease

## 2018-05-30 DIAGNOSIS — R011 Cardiac murmur, unspecified: Secondary | ICD-10-CM | POA: Diagnosis not present

## 2018-05-30 DIAGNOSIS — I350 Nonrheumatic aortic (valve) stenosis: Secondary | ICD-10-CM | POA: Insufficient documentation

## 2018-06-01 ENCOUNTER — Telehealth: Payer: Self-pay | Admitting: Cardiology

## 2018-06-01 ENCOUNTER — Other Ambulatory Visit: Payer: Self-pay | Admitting: Cardiology

## 2018-06-01 ENCOUNTER — Other Ambulatory Visit: Payer: Self-pay

## 2018-06-01 ENCOUNTER — Telehealth: Payer: Self-pay

## 2018-06-01 DIAGNOSIS — I350 Nonrheumatic aortic (valve) stenosis: Secondary | ICD-10-CM

## 2018-06-01 DIAGNOSIS — R072 Precordial pain: Secondary | ICD-10-CM

## 2018-06-01 DIAGNOSIS — I10 Essential (primary) hypertension: Secondary | ICD-10-CM

## 2018-06-01 NOTE — Telephone Encounter (Signed)
 @  [facility] [address] [place] [address] [place] [id number] Dept: [phone] Loc: [phone]  [name] Bell  06/01/2018  You are scheduled for a Right and Left Cardiac Cath on Tuesday 06/13/18,  with Dr.Jordan.  1. [facility] at [facility]: [address] [place], [place] [address] at 8:30 am (This time is two hours before your procedure to ensure your preparation). Free valet parking service is available.   Special note: Every effort is made to have your procedure done on time. Please understand that emergencies sometimes delay scheduled procedures.  2. Diet: Do not eat solid foods after midnight.  The patient may have clear liquids until 5am upon the day of the procedure.  3. Labs: You will need to have blood drawn on Thursday 06/01/18. You do not need to be fasting.  4. Medication instructions in preparation for your procedure:      Hold Insulin and Actos morning of Cath.    On the morning of your procedure, take your Aspirin 81 mg and any morning medicines NOT listed above.  You may use sips of water.  5. Plan for one night stay--bring personal belongings. 6. Bring a current list of your medications and current insurance cards. 7. You MUST have a responsible person to drive you home. 8. Someone MUST be with you the first 24 hours after you arrive home or your discharge will be delayed. 9. Please wear clothes that are easy to get on and off and wear slip-on shoes.  Thank you for allowing us to care for you!   -- [facility]

## 2018-06-01 NOTE — Telephone Encounter (Signed)
Wife states she already spoke with Anabel Halonheryl Pugh LPN and confirmed

## 2018-06-01 NOTE — Telephone Encounter (Signed)
New message   Patient is calling to see if he still has a cath procedure on 06/02/2018? Please advise.

## 2018-06-01 NOTE — Progress Notes (Signed)
bmet  

## 2018-06-02 LAB — BASIC METABOLIC PANEL
BUN / CREAT RATIO: 13 (ref 10–24)
BUN: 21 mg/dL (ref 8–27)
CO2: 20 mmol/L (ref 20–29)
Calcium: 9 mg/dL (ref 8.6–10.2)
Chloride: 109 mmol/L — ABNORMAL HIGH (ref 96–106)
Creatinine, Ser: 1.58 mg/dL — ABNORMAL HIGH (ref 0.76–1.27)
GFR calc non Af Amer: 40 mL/min/{1.73_m2} — ABNORMAL LOW (ref >59)
GFR, EST AFRICAN AMERICAN: 46 mL/min/{1.73_m2} — AB (ref >59)
Glucose: 100 mg/dL — ABNORMAL HIGH (ref 65–99)
Potassium: 4.3 mmol/L (ref 3.5–5.2)
Sodium: 143 mmol/L (ref 134–144)

## 2018-06-13 ENCOUNTER — Encounter (HOSPITAL_COMMUNITY)
Admission: RE | Disposition: A | Discharge status: Home / Self Care | Payer: Self-pay | Source: Home / Self Care | Attending: Cardiology

## 2018-06-13 ENCOUNTER — Ambulatory Visit (HOSPITAL_COMMUNITY)
Admission: RE | Admit: 2018-06-13 | Discharge: 2018-06-13 | Disposition: A | Discharge status: Home / Self Care | Payer: Medicare Other | Attending: Cardiology | Admitting: Cardiology

## 2018-06-13 ENCOUNTER — Other Ambulatory Visit: Payer: Self-pay

## 2018-06-13 DIAGNOSIS — Z87891 Personal history of nicotine dependence: Secondary | ICD-10-CM | POA: Diagnosis not present

## 2018-06-13 DIAGNOSIS — Z7982 Long term (current) use of aspirin: Secondary | ICD-10-CM | POA: Insufficient documentation

## 2018-06-13 DIAGNOSIS — R0789 Other chest pain: Secondary | ICD-10-CM | POA: Diagnosis not present

## 2018-06-13 DIAGNOSIS — K8020 Calculus of gallbladder without cholecystitis without obstruction: Secondary | ICD-10-CM | POA: Diagnosis present

## 2018-06-13 DIAGNOSIS — I2510 Atherosclerotic heart disease of native coronary artery without angina pectoris: Secondary | ICD-10-CM

## 2018-06-13 DIAGNOSIS — K219 Gastro-esophageal reflux disease without esophagitis: Secondary | ICD-10-CM | POA: Insufficient documentation

## 2018-06-13 DIAGNOSIS — M109 Gout, unspecified: Secondary | ICD-10-CM | POA: Insufficient documentation

## 2018-06-13 DIAGNOSIS — I350 Nonrheumatic aortic (valve) stenosis: Secondary | ICD-10-CM | POA: Diagnosis not present

## 2018-06-13 DIAGNOSIS — E7800 Pure hypercholesterolemia, unspecified: Secondary | ICD-10-CM | POA: Diagnosis present

## 2018-06-13 DIAGNOSIS — M1990 Unspecified osteoarthritis, unspecified site: Secondary | ICD-10-CM | POA: Diagnosis not present

## 2018-06-13 DIAGNOSIS — I10 Essential (primary) hypertension: Secondary | ICD-10-CM | POA: Diagnosis not present

## 2018-06-13 DIAGNOSIS — R072 Precordial pain: Secondary | ICD-10-CM | POA: Diagnosis present

## 2018-06-13 DIAGNOSIS — E1151 Type 2 diabetes mellitus with diabetic peripheral angiopathy without gangrene: Secondary | ICD-10-CM | POA: Insufficient documentation

## 2018-06-13 DIAGNOSIS — E119 Type 2 diabetes mellitus without complications: Secondary | ICD-10-CM

## 2018-06-13 DIAGNOSIS — I739 Peripheral vascular disease, unspecified: Secondary | ICD-10-CM | POA: Diagnosis present

## 2018-06-13 DIAGNOSIS — Z794 Long term (current) use of insulin: Secondary | ICD-10-CM | POA: Insufficient documentation

## 2018-06-13 HISTORY — PX: RIGHT/LEFT HEART CATH AND CORONARY ANGIOGRAPHY: CATH118266

## 2018-06-13 LAB — POCT I-STAT 3, ART BLOOD GAS (G3+)
Acid-base deficit: 7 mmol/L — ABNORMAL HIGH (ref 0.0–2.0)
Bicarbonate: 17.4 mmol/L — ABNORMAL LOW (ref 20.0–28.0)
O2 SAT: 93 %
PCO2 ART: 30.6 mmHg — AB (ref 32.0–48.0)
PH ART: 7.363 (ref 7.350–7.450)
TCO2: 18 mmol/L — ABNORMAL LOW (ref 22–32)
pO2, Arterial: 69 mmHg — ABNORMAL LOW (ref 83.0–108.0)

## 2018-06-13 LAB — CBC
HCT: 41.6 % (ref 39.0–52.0)
Hemoglobin: 13.2 g/dL (ref 13.0–17.0)
MCH: 33.6 pg (ref 26.0–34.0)
MCHC: 31.7 g/dL (ref 30.0–36.0)
MCV: 105.9 fL — AB (ref 80.0–100.0)
PLATELETS: 99 10*3/uL — AB (ref 150–400)
RBC: 3.93 MIL/uL — ABNORMAL LOW (ref 4.22–5.81)
RDW: 14.5 % (ref 11.5–15.5)
WBC: 6.2 10*3/uL (ref 4.0–10.5)
nRBC: 0 % (ref 0.0–0.2)

## 2018-06-13 LAB — POCT I-STAT 3, VENOUS BLOOD GAS (G3P V)
Acid-base deficit: 6 mmol/L — ABNORMAL HIGH (ref 0.0–2.0)
Bicarbonate: 19 mmol/L — ABNORMAL LOW (ref 20.0–28.0)
O2 Saturation: 67 %
TCO2: 20 mmol/L — ABNORMAL LOW (ref 22–32)
pCO2, Ven: 36.1 mmHg — ABNORMAL LOW (ref 44.0–60.0)
pH, Ven: 7.33 (ref 7.250–7.430)
pO2, Ven: 37 mmHg (ref 32.0–45.0)

## 2018-06-13 LAB — GLUCOSE, CAPILLARY
Glucose-Capillary: 99 mg/dL (ref 70–99)
Glucose-Capillary: 80 mg/dL (ref 70–99)

## 2018-06-13 SURGERY — RIGHT/LEFT HEART CATH AND CORONARY ANGIOGRAPHY
Anesthesia: LOCAL

## 2018-06-13 MED ORDER — MIDAZOLAM HCL 2 MG/2ML IJ SOLN
INTRAMUSCULAR | Status: AC
  Filled 2018-06-13: qty 2

## 2018-06-13 MED ORDER — ONDANSETRON HCL 4 MG/2ML IJ SOLN: 4.0000 mg | Freq: Four times a day (QID) | INTRAMUSCULAR | Status: DC | PRN

## 2018-06-13 MED ORDER — HEPARIN SODIUM (PORCINE) 1000 UNIT/ML IJ SOLN
INTRAMUSCULAR | Status: DC | PRN
  Administered 2018-06-13: 5500 [IU] via INTRAVENOUS

## 2018-06-13 MED ORDER — SODIUM CHLORIDE 0.9% FLUSH: 3.0000 mL | Freq: Two times a day (BID) | INTRAVENOUS | Status: DC

## 2018-06-13 MED ORDER — SODIUM CHLORIDE 0.9 % WEIGHT BASED INFUSION: 1.0000 mL/kg/h | INTRAVENOUS | Status: AC

## 2018-06-13 MED ORDER — SODIUM CHLORIDE 0.9% FLUSH: 3.0000 mL | INTRAVENOUS | Status: DC | PRN

## 2018-06-13 MED ORDER — MIDAZOLAM HCL 2 MG/2ML IJ SOLN
INTRAMUSCULAR | Status: DC | PRN
  Administered 2018-06-13: 1 mg via INTRAVENOUS

## 2018-06-13 MED ORDER — HEPARIN (PORCINE) IN NACL 1000-0.9 UT/500ML-% IV SOLN
INTRAVENOUS | Status: DC | PRN
  Administered 2018-06-13 (×2): 500 mL

## 2018-06-13 MED ORDER — LIDOCAINE HCL (PF) 1 % IJ SOLN
INTRAMUSCULAR | Status: AC
  Filled 2018-06-13: qty 30

## 2018-06-13 MED ORDER — VERAPAMIL HCL 2.5 MG/ML IV SOLN
INTRAVENOUS | Status: DC | PRN
  Administered 2018-06-13: 10 mL via INTRA_ARTERIAL

## 2018-06-13 MED ORDER — SODIUM CHLORIDE 0.9 % IV SOLN: 250.0000 mL | INTRAVENOUS | Status: DC | PRN

## 2018-06-13 MED ORDER — ACETAMINOPHEN 325 MG PO TABS: 650.0000 mg | ORAL_TABLET | ORAL | Status: DC | PRN

## 2018-06-13 MED ORDER — LIDOCAINE HCL (PF) 1 % IJ SOLN
INTRAMUSCULAR | Status: DC | PRN
  Administered 2018-06-13: 5 mL

## 2018-06-13 MED ORDER — SODIUM CHLORIDE 0.9 % WEIGHT BASED INFUSION
3.0000 mL/kg/h | INTRAVENOUS | Status: AC
  Administered 2018-06-13: 3 mL/kg/h via INTRAVENOUS

## 2018-06-13 MED ORDER — SODIUM CHLORIDE 0.9 % WEIGHT BASED INFUSION: 1.0000 mL/kg/h | INTRAVENOUS | Status: DC

## 2018-06-13 MED ORDER — VERAPAMIL HCL 2.5 MG/ML IV SOLN
INTRAVENOUS | Status: AC
  Filled 2018-06-13: qty 2

## 2018-06-13 MED ORDER — ASPIRIN 81 MG PO CHEW: 81.0000 mg | CHEWABLE_TABLET | ORAL | Status: DC

## 2018-06-13 MED ORDER — HEPARIN (PORCINE) IN NACL 1000-0.9 UT/500ML-% IV SOLN
INTRAVENOUS | Status: AC
  Filled 2018-06-13: qty 1000

## 2018-06-13 MED ORDER — IOHEXOL 350 MG/ML SOLN
INTRAVENOUS | Status: DC | PRN
  Administered 2018-06-13: 80 mL via INTRA_ARTERIAL

## 2018-06-13 MED ORDER — FENTANYL CITRATE (PF) 100 MCG/2ML IJ SOLN
INTRAMUSCULAR | Status: DC | PRN
  Administered 2018-06-13: 25 ug via INTRAVENOUS

## 2018-06-13 MED ORDER — FENTANYL CITRATE (PF) 100 MCG/2ML IJ SOLN
INTRAMUSCULAR | Status: AC
  Filled 2018-06-13: qty 2

## 2018-06-13 MED ORDER — HEPARIN SODIUM (PORCINE) 1000 UNIT/ML IJ SOLN
INTRAMUSCULAR | Status: AC
  Filled 2018-06-13: qty 1

## 2018-06-13 SURGICAL SUPPLY — 15 items
CATH 5FR JL3.5 JR4 ANG PIG MP (CATHETERS) ×2 IMPLANT
CATH BALLN WEDGE 5F 110CM (CATHETERS) ×2 IMPLANT
CATH EXPO 5F MPA-1 (CATHETERS) ×2 IMPLANT
CATH INFINITI 5 FR 3DRC (CATHETERS) ×2 IMPLANT
CATH INFINITI 5FR AL1 (CATHETERS) ×2 IMPLANT
DEVICE RAD COMP TR BAND LRG (VASCULAR PRODUCTS) ×2 IMPLANT
GLIDESHEATH SLEND SS 6F .021 (SHEATH) ×2 IMPLANT
GUIDEWIRE INQWIRE 1.5J.035X260 (WIRE) ×1 IMPLANT
INQWIRE 1.5J .035X260CM (WIRE) ×2
KIT HEART LEFT (KITS) ×2 IMPLANT
PACK CARDIAC CATHETERIZATION (CUSTOM PROCEDURE TRAY) ×2 IMPLANT
SHEATH GLIDE SLENDER 4/5FR (SHEATH) ×2 IMPLANT
TRANSDUCER W/STOPCOCK (MISCELLANEOUS) ×2 IMPLANT
TUBING CIL FLEX 10 FLL-RA (TUBING) ×2 IMPLANT
WIRE EMERALD ST .035X260CM (WIRE) ×2 IMPLANT

## 2018-06-13 NOTE — Interval H&P Note (Signed)
History and Physical Interval Note:  06/13/2018 10:32 AM  [name] OharaNed [name]  has presented today for surgery, with the diagnosis of aortic stenosis  The various methods of treatment have been discussed with the patient and family. After consideration of risks, benefits and other options for treatment, the patient has consented to  Procedure(s): RIGHT/LEFT HEART CATH AND CORONARY ANGIOGRAPHY (N/A) as a surgical intervention .  The patient's history has been reviewed, patient examined, no change in status, stable for surgery.  I have reviewed the patient's chart and labs.  Questions were answered to the patient's satisfaction.     Theron Aristaeter [facility] ChicagoJordanMD,FACC 06/13/2018 10:32 AM

## 2018-06-13 NOTE — Discharge Instructions (Signed)
Drink plenty of fluids over next 48 hours and keep right wrist elevated at heart level for 24 hours ° °Radial Site Care ° °This sheet gives you information about how to care for yourself after your procedure. Your health care provider may also give you more specific instructions. If you have problems or questions, contact your health care provider. °What can I expect after the procedure? °After the procedure, it is common to have: °· Bruising and tenderness at the catheter insertion area. °Follow these instructions at home: °Medicines °· Take over-the-counter and prescription medicines only as told by your health care provider. °Insertion site care °· Follow instructions from your health care provider about how to take care of your insertion site. Make sure you: °? Wash your hands with soap and water before you change your bandage (dressing). If soap and water are not available, use hand sanitizer. °? Change your dressing as told by your health care provider. °? Leave stitches (sutures), skin glue, or adhesive strips in place. These skin closures may need to stay in place for 2 weeks or longer. If adhesive strip edges start to loosen and curl up, you may trim the loose edges. Do not remove adhesive strips completely unless your health care provider tells you to do that. °· Check your insertion site every day for signs of infection. Check for: °? Redness, swelling, or pain. °? Fluid or blood. °? Pus or a bad smell. °? Warmth. °· Do not take baths, swim, or use a hot tub until your health care provider approves. °· You may shower 24-48 hours after the procedure, or as directed by your health care provider. °? Remove the dressing and gently wash the site with plain soap and water. °? Pat the area dry with a clean towel. °? Do not rub the site. That could cause bleeding. °· Do not apply powder or lotion to the site. °Activity ° °· For 24 hours after the procedure, or as directed by your health care provider: °? Do not  flex or bend the affected arm. °? Do not push or pull heavy objects with the affected arm. °? Do not drive yourself home from the hospital or clinic. You may drive 24 hours after the procedure unless your health care provider tells you not to. °? Do not operate machinery or power tools. °· Do not lift anything that is heavier than 10 lb (4.5 kg), or the limit that you are told, until your health care provider says that it is safe. °· Ask your health care provider when it is okay to: °? Return to work or school. °? Resume usual physical activities or sports. °? Resume sexual activity. °General instructions °· If the catheter site starts to bleed, raise your arm and put firm pressure on the site. If the bleeding does not stop, get help right away. This is a medical emergency. °· If you went home on the same day as your procedure, a responsible adult should be with you for the first 24 hours after you arrive home. °· Keep all follow-up visits as told by your health care provider. This is important. °Contact a health care provider if: °· You have a fever. °· You have redness, swelling, or yellow drainage around your insertion site. °Get help right away if: °· You have unusual pain at the radial site. °· The catheter insertion area swells very fast. °· The insertion area is bleeding, and the bleeding does not stop when you hold steady pressure on   the area. °· Your arm or hand becomes pale, cool, tingly, or numb. °These symptoms may represent a serious problem that is an emergency. Do not wait to see if the symptoms will go away. Get medical help right away. Call your local emergency services (911 in the [place] Do not drive yourself to the hospital. °Summary °· After the procedure, it is common to have bruising and tenderness at the site. °· Follow instructions from your health care provider about how to take care of your radial site wound. Check the wound every day for signs of infection. °· Do not lift anything that is  heavier than 10 lb (4.5 kg), or the limit that you are told, until your health care provider says that it is safe. °This information is not intended to replace advice given to you by your health care provider. Make sure you discuss any questions you have with your health care provider. °Document Released: 07/03/2010 Document Revised: 07/06/2017 Document Reviewed: 07/06/2017 °[employer] Interactive Patient Education © 2019 [employer] ° °

## 2018-06-15 ENCOUNTER — Encounter (HOSPITAL_COMMUNITY): Payer: Self-pay | Admitting: Cardiology

## 2018-06-15 ENCOUNTER — Other Ambulatory Visit: Payer: Self-pay

## 2018-06-15 DIAGNOSIS — I350 Nonrheumatic aortic (valve) stenosis: Secondary | ICD-10-CM

## 2018-06-15 DIAGNOSIS — N289 Disorder of kidney and ureter, unspecified: Secondary | ICD-10-CM

## 2018-06-19 ENCOUNTER — Ambulatory Visit: Payer: Medicare Other | Admitting: Cardiology

## 2018-06-19 ENCOUNTER — Ambulatory Visit (HOSPITAL_COMMUNITY)
Admission: RE | Admit: 2018-06-19 | Discharge: 2018-06-19 | Disposition: A | Discharge status: Home / Self Care | Payer: Medicare Other | Source: Ambulatory Visit | Attending: Cardiovascular Disease | Admitting: Cardiovascular Disease

## 2018-06-19 ENCOUNTER — Ambulatory Visit (HOSPITAL_COMMUNITY): Payer: Medicare Other

## 2018-06-19 DIAGNOSIS — N289 Disorder of kidney and ureter, unspecified: Secondary | ICD-10-CM

## 2018-06-19 DIAGNOSIS — I350 Nonrheumatic aortic (valve) stenosis: Secondary | ICD-10-CM

## 2018-06-19 MED ORDER — SODIUM BICARBONATE BOLUS VIA INFUSION
INTRAVENOUS | Status: AC
  Administered 2018-06-19: 300 meq via INTRAVENOUS
  Filled 2018-06-19: qty 1

## 2018-06-19 MED ORDER — IOPAMIDOL (ISOVUE-370) INJECTION 76%
100.0000 mL | Freq: Once | INTRAVENOUS | Status: AC | PRN
  Administered 2018-06-19: 100 mL via INTRAVENOUS

## 2018-06-19 MED ORDER — SODIUM BICARBONATE 8.4 % IV SOLN
INTRAVENOUS | Status: AC
  Administered 2018-06-19: 14:00:00 via INTRAVENOUS
  Filled 2018-06-19: qty 500

## 2018-06-19 NOTE — Progress Notes (Signed)
PAtient taken to CT for scan. Report called to Boneta Lucks, RN in short stay. States patient will come to room 11 after scan. Tresa Endo in [place] informed about patient.

## 2018-06-22 ENCOUNTER — Other Ambulatory Visit: Payer: Self-pay

## 2018-06-22 ENCOUNTER — Ambulatory Visit: Payer: Medicare Other | Admitting: Cardiovascular Disease

## 2018-06-22 ENCOUNTER — Encounter: Payer: Self-pay | Admitting: Cardiovascular Disease

## 2018-06-22 VITALS — BP 122/78 | HR 90 | Ht 70.0 in | Wt 245.4 lb

## 2018-06-22 DIAGNOSIS — I350 Nonrheumatic aortic (valve) stenosis: Secondary | ICD-10-CM | POA: Diagnosis not present

## 2018-06-22 DIAGNOSIS — N289 Disorder of kidney and ureter, unspecified: Secondary | ICD-10-CM

## 2018-06-22 NOTE — Progress Notes (Addendum)
 Valve Clinic Consult Note  Chief Complaint  Patient presents with  . New Patient (Initial Visit)    severe aortic stenosis   History of Present Illness:85 yo male with history of diabetes mellitus, GERD, gout, hiatal hernia, HTN, hyperlipidemia, PAD and severe aortic stenosis who is here today as a new patient, referred by Dr. Jordan, for further discussion regarding his aortic stenosis and possible TAVR. He was seen by Dr. Jordan on 05/26/18 with c/o chest pain. This visit was also to provide clearance for cholecystectomy. 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 Echo in 2013 with mild aortic stenosis. He described mild chest pain with exertion. He also has had nausea and HIDA scan was abnormal. He will need to have cholecystectomy. Echo 05/30/18 with normal LV size and function, LVEF=55-65%. Wall motion was normal. The aortic valve leaflets are thickened and calcified with reduced mobility. Mean gradient 42 mmHg, peak gradient 67 mmHg, AVA 0.45 cm2, dimensionless index 0.14. Cardiac cath 06/13/18 with non-obstructive disease in the LAD and RCA. There is a moderately severe stenosis in the RV marginal branch.   He tells me today that he has dyspnea with exertion. He is very inactive. He has severe knee pain and uses a walker. He also has spinal stenosis. He has mild chest pain with exertion. He has fatigue. No lower extremity edema. He continues to have some nausea felt to be due to his gallbladder issues but this has been stable recently. He sees the dentist regularly. He has a broken tooth and is seeing his dentist next week. He lives with his wife. He is retired from the telephone industry.   Primary Care Physician: Paterson, Daniel, MD Referring Cardiologist: Peter Jordan Primary Cardiologist: Peter Jordan  Past Medical History:  Diagnosis Date  . Acid reflux disease   . Arthritis of back   . CAD (coronary artery disease)   . Diabetes  mellitus, type 2 (HCC)   . History of gout   . History of hiatal hernia   . Hx of urinary tract infection   . Hypercholesterolemia   . Hypertension   . Osteoarthritis    left knee  . Pneumonia   . PVD (peripheral vascular disease) (HCC)   . Severe aortic stenosis     Past Surgical History:  Procedure Laterality Date  . ABDOMINAL AORTIC ANEURYSM REPAIR    . APPENDECTOMY    . FEMORAL-POPLITEAL BYPASS GRAFT     left  . LUMBAR SPINE SURGERY    . RIGHT/LEFT HEART CATH AND CORONARY ANGIOGRAPHY N/A 06/13/2018   Procedure: RIGHT/LEFT HEART CATH AND CORONARY ANGIOGRAPHY;  Surgeon: Jordan, Peter M, MD;  Location: [facility];  Service: Cardiovascular;  Laterality: N/A;  . TOTAL KNEE ARTHROPLASTY     bilateral    Current Outpatient Medications  Medication Sig Dispense Refill  . allopurinol (ZYLOPRIM) 300 MG tablet Take 300 mg by mouth daily.     . amLODipine (NORVASC) 5 MG tablet Take 5 mg by mouth daily.     . aspirin 81 MG tablet Take 81 mg by mouth daily.    . dorzolamide-timolol (COSOPT) 22.3-6.8 MG/ML ophthalmic solution Place 1 drop into the left eye 2 (two) times daily.    . hydrocortisone cream 1 % Apply 1 application topically daily as needed for itching.    . insulin detemir (LEVEMIR) 100 UNIT/ML injection Inject 32 Units into the skin every morning.     . losartan (COZAAR)   100 MG tablet Take 100 mg by mouth daily.     . Multiple Vitamin (MULTIVITAMIN WITH MINERALS) TABS tablet Take 2 tablets by mouth daily.    . pantoprazole (PROTONIX) 40 MG tablet Take 40 mg by mouth daily.    . pioglitazone (ACTOS) 15 MG tablet Take 15 mg by mouth daily.     . simvastatin (ZOCOR) 80 MG tablet Take 80 mg by mouth daily.     . sitaGLIPtin (JANUVIA) 50 MG tablet Take 50 mg by mouth daily.     No current facility-administered medications for this visit.     Allergies  Allergen Reactions  . Indocin [Indomethacin] Other (See Comments)    Antiinflammatory medication for gout ? Indocin  > black out per patient    Social History   Socioeconomic History  . Marital status: Married    Spouse name: Not on file  . Number of children: 4  . Years of education: Not on file  . Highest education level: Not on file  Occupational History  . 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 Financial resource strain: Not on file  . Food insecurity:    Worry: Not on file    Inability: Not on file  . Transportation needs:    Medical: Not on file    Non-medical: Not on file  Tobacco Use  . Smoking status: Former Smoker    Packs/day: 2.00    Years: 30.00    Pack years: 60.00    Types: Cigarettes    Last attempt to quit: 11/04/1984    Years since quitting: 33.6  . Smokeless tobacco: Never Used  Substance and Sexual Activity  . Alcohol use: Yes    Alcohol/week: 0.0 standard drinks    Comment: social  . Drug use: No  . Sexual activity: Not on file  Lifestyle  . Physical activity:    Days per week: Not on file    Minutes per session: Not on file  . Stress: Not on file  Relationships  . Social connections:    Talks on phone: Not on file    Gets together: Not on file    Attends religious service: Not on file    Active member of club or organization: Not on file    Attends meetings of clubs or organizations: Not on file    Relationship status: Not on file  . Intimate partner violence:    Fear of current or ex partner: Not on file    Emotionally abused: Not on file    Physically abused: Not on file    Forced sexual activity: Not on file  Other Topics Concern  . Not on file  Social History Narrative  . Not on file    Family History  Problem Relation Age of Onset  . Heart attack Mother   . Alzheimer's disease Father   . Cancer Sister     Review of Systems:  As stated in the HPI and otherwise negative.   BP 122/78   Pulse 90   Ht 5' 10" (1.778 m)   Wt 245 lb 6.4 oz (111.3 kg)   SpO2 97%   BMI 35.21 kg/m   Physical Examination: General: Well  developed, well nourished, NAD  HEENT: OP clear, mucus membranes moist  SKIN: warm, dry. No rashes. Neuro: No focal deficits  Musculoskeletal: Muscle strength 5/5 all ext  Psychiatric: Mood and affect normal  Neck: No JVD, no carotid bruits, no thyromegaly, no lymphadenopathy.  Lungs:Clear bilaterally,   no wheezes, rhonci, crackles Cardiovascular: Regular rate and rhythm. Loud, harsh systolic murmur.  Abdomen:Soft. Bowel sounds present. Non-tender.  Extremities: No lower extremity edema.   Cardiac cath 06/13/18:   Prox LAD lesion is 25% stenosed.  Ost RCA lesion is 40% stenosed.  Acute Mrg lesion is 80% stenosed.  LV end diastolic pressure is normal.  There is severe aortic valve stenosis.   1. Nonobstructive CAD except for 80% RV marginal branch 2. Severe calcific aortic stenosis. Mean gradient 45 mm Hg, peak to peak 66 mm Hg, AV area 0.9 cm squared with index 0.4 3. Normal right heart and LV filling pressures 4. Normal cardiac output. Diagnostic  Dominance: Right     Fick Cardiac Output 6.51 L/min  Fick Cardiac Output Index 2.85 (L/min)/BSA  Aortic Mean Gradient 44.4 mmHg  Aortic Peak Gradient 66 mmHg  Aortic Valve Area 0.92  Aortic Value Area Index 0.4 cm2/BSA  RA A Wave 6 mmHg  RA V Wave 5 mmHg  RA Mean 4 mmHg  RV Systolic Pressure 33 mmHg  RV Diastolic Pressure 0 mmHg  RV EDP 6 mmHg  PA Systolic Pressure 28 mmHg  PA Diastolic Pressure 8 mmHg  PA Mean 18 mmHg  PW A Wave 10 mmHg  PW V Wave 8 mmHg  PW Mean 9 mmHg  AO Systolic Pressure 142 mmHg  AO Diastolic Pressure 67 mmHg  AO Mean 97 mmHg  LV Systolic Pressure 215 mmHg  LV Diastolic Pressure -16 mmHg  LV EDP 15 mmHg  AOp Systolic Pressure 150 mmHg  AOp Diastolic Pressure 71 mmHg  AOp Mean Pressure 105 mmHg  LVp Systolic Pressure 216 mmHg  LVp Diastolic Pressure 0 mmHg  LVp EDP Pressure 14 mmHg  QP/QS 1  TPVR Index 6.31 HRUI  TSVR Index 34.01 HRUI  PVR SVR Ratio 0.1  TPVR/TSVR Ratio 0.19   Echo  05/30/18: Left ventricle: The cavity size was normal. Wall thickness was   normal. Systolic function was normal. The estimated ejection   fraction was in the range of 55% to 65%. Wall motion was normal;   there were no regional wall motion abnormalities. Doppler   parameters are consistent with abnormal left ventricular   relaxation (grade 1 diastolic dysfunction). - Aortic valve: There was severe stenosis. Mean gradient (S): 42 mm   Hg. Peak gradient (S): 67 mm Hg.  ------------------------------------------------------------------- Labs, prior tests, procedures, and surgery: Transthoracic echocardiography (06/13/2012).    The aortic valve showed mild stenosis.  EF was 55% and PA pressure was 31 (systolic). Aortic valve: peak gradient of 20 mm Hg and mean gradient of 31 mm Hg.  ------------------------------------------------------------------- Study data:  Comparison was made to the study of 06/13/2012. Strain imaging.  Study status:  Routine.  Procedure:  The patient reported no pain pre or post test. Transthoracic echocardiography. Image quality was adequate.  Study completion:  There were no complications.          Transthoracic echocardiography.  M-mode, complete 2D, spectral Doppler, and color Doppler.  Birthdate: Patient birthdate: 12/02/1931.  Age:  Patient is 86 yr old.  Sex: Gender: male.    BMI: 35.5 kg/m^2.  Blood pressure:     116/63 Patient status:  Outpatient.  Study date:  Study date: 05/30/2018. Study time: 02:57 PM.  Location:  [place] [facility]  -------------------------------------------------------------------  ------------------------------------------------------------------- Left ventricle:  GLS = -12.4%.  The cavity size was normal. Wall thickness was normal. Systolic function was normal. The estimated ejection fraction was in the range   of 55% to 65%. Wall motion was normal; there were no regional wall motion abnormalities. Doppler parameters are  consistent with abnormal left ventricular relaxation (grade 1 diastolic dysfunction).  ------------------------------------------------------------------- Aortic valve:   Moderately thickened, moderately calcified leaflets.  Doppler:   There was severe stenosis.      VTI ratio of LVOT to aortic valve: 0.14. Valve area (VTI): 0.45 cm^2. Indexed valve area (VTI): 0.19 cm^2/m^2. Peak velocity ratio of LVOT to aortic valve: 0.17. Valve area (Vmax): 0.54 cm^2. Indexed valve area (Vmax): 0.23 cm^2/m^2. Mean velocity ratio of LVOT to aortic valve: 0.15. Valve area (Vmean): 0.47 cm^2. Indexed valve area (Vmean): 0.2 cm^2/m^2.    Mean gradient (S): 42 mm Hg. Peak gradient (S): 67 mm Hg.  ------------------------------------------------------------------- Aorta:  Aortic root: The aortic root was normal in size. Ascending aorta: The ascending aorta was normal in size.  ------------------------------------------------------------------- Mitral valve:   Mildly thickened leaflets .  Doppler:   There was no evidence for stenosis.      Valve area by pressure half-time: 5.64 cm^2. Indexed valve area by pressure half-time: 2.35 cm^2/m^2.   ------------------------------------------------------------------- Left atrium:  The atrium was normal in size.  ------------------------------------------------------------------- Right ventricle:  The cavity size was normal. Systolic function was normal.  ------------------------------------------------------------------- Pulmonic valve:   Poorly visualized.  The valve appears to be grossly normal.    Doppler:  There was no significant regurgitation.  ------------------------------------------------------------------- Tricuspid valve:   The valve appears to be grossly normal. Doppler:  There was trivial regurgitation.  ------------------------------------------------------------------- Right atrium:  The atrium was normal in  size.  ------------------------------------------------------------------- Pericardium:  There was no pericardial effusion.  ------------------------------------------------------------------- Measurements   Left ventricle                            Value          Reference  LV ID, ED, PLAX chordal                   48    mm       43 - 52  LV ID, ES, PLAX chordal                   35    mm       23 - 38  LV fx shortening, PLAX chordal    (L)     27    %        >=29  LV PW thickness, ED                       10    mm       ---------  IVS/LV PW ratio, ED                       1              <=1.3  Stroke volume, 2D                         50    ml       ---------  Stroke volume/bsa, 2D                     21    ml/m^2   ---------  LV e&', lateral                              5.25  cm/s     ---------  LV E/e&', lateral                          8.74           ---------  LV e&', medial                             3.98  cm/s     ---------  LV E/e&', medial                           11.53          ---------  LV e&', average                            4.62  cm/s     ---------  LV E/e&', average                          9.95           ---------    Ventricular septum                        Value          Reference  IVS thickness, ED                         10    mm       ---------    LVOT                                      Value          Reference  LVOT ID, S                                20    mm       ---------  LVOT area                                 3.14  cm^2     ---------  LVOT peak velocity, S                     70.5  cm/s     ---------  LVOT mean velocity, S                     46    cm/s     ---------  LVOT VTI, S                               15.9  cm       ---------    Aortic valve                              Value          Reference  Aortic valve peak velocity, S               409   cm/s     ---------  Aortic valve mean velocity, S             307   cm/s     ---------   Aortic valve VTI, S                       110   cm       ---------  Aortic mean gradient, S                   42    mm Hg    ---------  Aortic peak gradient, S                   67    mm Hg    ---------  VTI ratio, LVOT/AV                        0.14           ---------  Aortic valve area, VTI                    0.45  cm^2     ---------  Aortic valve area/bsa, VTI                0.19  cm^2/m^2 ---------  Velocity ratio, peak, LVOT/AV             0.17           ---------  Aortic valve area, peak velocity          0.54  cm^2     ---------  Aortic valve area/bsa, peak               0.23  cm^2/m^2 ---------  velocity  Velocity ratio, mean, LVOT/AV             0.15           ---------  Aortic valve area, mean velocity          0.47  cm^2     ---------  Aortic valve area/bsa, mean               0.2   cm^2/m^2 ---------  velocity    Aorta                                     Value          Reference  Aortic root ID, ED                        35    mm       ---------  Ascending aorta ID, A-P, S                34    mm       ---------    Left atrium                               Value          Reference  LA ID, A-P, ES                            36    mm       ---------    LA ID/bsa, A-P                            1.5   cm/m^2   <=2.2  LA volume, S                              44.1  ml       ---------  LA volume/bsa, S                          18.4  ml/m^2   ---------  LA volume, ES, 1-p A4C                    35.6  ml       ---------  LA volume/bsa, ES, 1-p A4C                14.9  ml/m^2   ---------  LA volume, ES, 1-p A2C                    49.9  ml       ---------  LA volume/bsa, ES, 1-p A2C                20.8  ml/m^2   ---------    Mitral valve                              Value          Reference  Mitral E-wave peak velocity               45.9  cm/s     ---------  Mitral A-wave peak velocity               103   cm/s     ---------  Mitral deceleration time          (L)     134   ms        150 - 230  Mitral pressure half-time                 39    ms       ---------  Mitral E/A ratio, peak                    0.4            ---------  Mitral valve area, PHT, DP                5.64  cm^2     ---------  Mitral valve area/bsa, PHT, DP            2.35  cm^2/m^2 ---------    Systemic veins                            Value          Reference  Estimated CVP                             3     mm Hg    ---------    Right ventricle                             Value          Reference  TAPSE                                     19.4  mm       ---------  RV s&', lateral, S                         11.5  cm/s     ---------  CTA chest/abdomen/pelvis 06/19/18: Cardiovascular: Heart size is normal. There is no significant pericardial fluid, thickening or pericardial calcification. There is aortic atherosclerosis, as well as atherosclerosis of the great vessels of the mediastinum and the coronary arteries, including calcified atherosclerotic plaque in the left main, left anterior descending, left circumflex and right coronary arteries. Severe calcifications of the aortic valve.  Mediastinum/Lymph Nodes: No pathologically enlarged mediastinal or hilar lymph nodes. Small hiatal hernia. No axillary lymphadenopathy.  Lungs/Pleura: 1.8 cm calcified granuloma in the right lower lobe. Several other smaller calcified granulomas are also noted in the right lung. No other suspicious appearing pulmonary nodules or masses are noted. No acute consolidative airspace disease. No pleural effusions.  Musculoskeletal/Soft Tissues: There are no aggressive appearing lytic or blastic lesions noted in the visualized portions of the skeleton.  CTA ABDOMEN AND PELVIS FINDINGS  Hepatobiliary: Liver has a slightly shrunken appearance and nodular contour, indicative of underlying cirrhosis. No discrete cystic or solid hepatic lesions. No intra or extrahepatic biliary ductal dilatation. Small calcified  gallstones lying dependently in the neck of the gallbladder. No findings to suggest an acute cholecystitis at this time.  Pancreas: No pancreatic mass. No pancreatic ductal dilatation. No pancreatic or peripancreatic fluid or inflammatory changes.  Spleen: Numerous calcified granulomas in the spleen.  Adrenals/Urinary Tract: Low-attenuation lesions in the kidneys bilaterally, compatible with simple cysts, largest of which measures 5.2 cm in the interpolar region of the right kidney. Subcentimeter low-attenuation lesion in the lower pole the right kidney, too small to characterize, but statistically likely to represent a tiny cyst. Urothelial enhancement in the right renal pelvis. No hydroureteronephrosis. Urinary bladder is normal in appearance. Bilateral adrenal glands are normal in appearance.  Stomach/Bowel: Normal appearance of the stomach. No pathologic dilatation of small bowel or colon. Numerous colonic diverticulae are noted, with some inflammatory changes adjacent to a diverticulum in the descending colon (axial image 108 of series 15), which could indicate an acute diverticulitis. There is a short segment of the mid transverse colon which extends into an epigastric ventral hernia. The appendix is not confidently identified and may be surgically absent. Regardless, there are no inflammatory changes noted adjacent to the cecum to suggest the presence of an acute appendicitis at this time.  Vascular/Lymphatic: Aortic atherosclerosis. Postoperative changes of aorto bi-iliac graft placement. Today's study is considered nondiagnostic from a CTA perspective secondary to suboptimal contrast bolus. No lymphadenopathy noted in the abdomen or pelvis.  Reproductive: Prostate gland and seminal vesicles are unremarkable in appearance.  Other: Moderate-sized epigastric ventral hernia containing a short segment of the mid transverse colon. There is also a  smaller supraumbilical ventral hernia containing only omental fat immediately above the level of the umbilicus. No significant volume of ascites. No pneumoperitoneum.  Musculoskeletal: There are no aggressive appearing lytic or blastic lesions noted in the visualized portions of the skeleton.  VASCULAR MEASUREMENTS PERTINENT TO TAVR:  AORTA:  Minimal Aortic Diameter-18 x   19 mm  Severity of Aortic Calcification-moderate  Nondiagnostic assessment of pelvic vasculature secondary to suboptimal contrast bolus.  IMPRESSION: 1. Today's study is unfortunately nondiagnostic in terms of CTA assessment of the pelvic vasculature for upcoming TAVR procedure. Repeat CTA of the abdomen and pelvis is recommended prior to the procedure for full assessment of the pelvic arterial access vessels. 2. Severe calcifications of the aortic valve, compatible with the reported clinical history of severe aortic stenosis. 3. Colonic diverticulosis with inflammatory changes adjacent to the descending colon, concerning for an acute diverticulitis. Further clinical evaluation is recommended. 4. Cholelithiasis without evidence of acute cholecystitis at this time. 5. Two ventral hernias, one of which in epigastric region contains a short segment of the mid transverse colon. No findings to suggest acute bowel incarceration or obstruction at this time. 6. Aortic atherosclerosis, in addition to left main and 3 vessel coronary artery disease. 7. Additional incidental findings, as above.  Gated Cardiac CTA 06/19/18: FINDINGS: Aortic Valve: Tri leaflet and calcified with restricted leaflet motion  Aorta: Moderate calcific atherosclerosis with bovine arch no aneurysm  Sinotubular Junction: 30 mm  Ascending Thoracic Aorta: 34 mm  Aortic Arch: 28 mm  Descending Thoracic Aorta: 28 mm  Sinus of Valsalva Measurements:  Non-coronary: 31.5 mm  Right - coronary: 30.2 mm  Left - coronary: 31  mm  Coronary Artery Height above Annulus:  Left Main: 15.8 mm above annulus  Right Coronary: 15.6 mm above annulus  Virtual Basal Annulus Measurements:  Maximum/Minimum Diameter: 27 mm x 23.3 mm  Perimeter: 82 mm  Area: 503 mm2  Coronary Arteries: Sufficient height above annulus for deployment  Optimum Fluoroscopic Angle for Delivery: LAO 11 Caudal 7 degrees  IMPRESSION: 1. Tri leaflet AV with annulus of 503 mm 2 suitable for a 26 mm Sapien 3 valve  2.  Coronary arteries sufficient height above annulus for deployment  3. Optimum angiographic angle for deployment LAO 11 Caudal 7 degrees  4.  No LAA thrombus  5. Bovine arch with normal aortic root diameter moderate calcific aortic atherosclerosis  EKG:  EKG is not ordered today. The ekg ordered today demonstrates   Recent Labs: 06/01/2018: BUN 21; Creatinine, Ser 1.58; Potassium 4.3; Sodium 143 06/13/2018: Hemoglobin 13.2; Platelets 99   Lipid Panel No results found for: CHOL, TRIG, HDL, CHOLHDL, VLDL, LDLCALC, LDLDIRECT   Wt Readings from Last 3 Encounters:  06/22/18 245 lb 6.4 oz (111.3 kg)  06/13/18 246 lb (111.6 kg)  05/26/18 247 lb 9.6 oz (112.3 kg)     Other studies Reviewed: Additional studies/ records that were reviewed today include: Echo images, cath images.  Review of the above records demonstrates: severe AS   Assessment and Plan:   1. Severe aortic stenosis: He has severe, stage D aortic valve stenosis. I have personally reviewed the echo images. The aortic valve is thickened, calcified with limited leaflet mobility. I think he would benefit from AVR. Given advanced age, he is not a good candidate for conventional AVR by surgical approach. I think he may be a good candidate for TAVR.   STS Risk Score:  Procedure: Isolated AVR  Risk of Mortality: 4.913%  Renal Failure: 9.571%  Permanent Stroke: 1.622%  Prolonged Ventilation: 13.384%  DSW Infection: 0.319%  Reoperation: 5.229%   Morbidity or Mortality: 22.013%  Short Length of Stay: 19.713%  Long Length of Stay: 12.794%   I have reviewed the natural history of aortic stenosis with the patient and their family members  who are present today.   We have discussed the limitations of medical therapy and the poor prognosis associated with symptomatic aortic stenosis. We have reviewed potential treatment options, including palliative medical therapy, conventional surgical aortic valve replacement, and transcatheter aortic valve replacement. We discussed treatment options in the context of the patient's specific comorbid medical conditions.    He would like to proceed with planning for TAVR. He has completed his cardiac cath and gated cardiac CTA. Will have to repeat the CTA of the chest/abdomen and pelvis due to inadequate imaging of the aorto-iliac vessels on the scan on 06/19/18. Will arrange carotid dopplers and PFTs on the same day as the repeat scan. Risks and benefits of the TAVR procedure reviewed with the patient. He will be referred to see one of the CT surgeons on our TAVR team next week. He will likely require alternative access for TAVR given his severe aorto-iliac disease with prior bypass. 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 He is overall having minimal symptoms at this time in relation to his gallbladder. I will review this with our TAVR team.   Current medicines are reviewed at length with the patient today.  The patient does not have concerns regarding medicines.  The following changes have been made:  no change  Labs/ tests ordered today include:   Orders Placed This Encounter  Procedures  . Basic Metabolic Panel (BMET)   Disposition:   FU with the TAVR team.    Signed, Kaliah Haddaway, MD 06/22/2018 1:39 PM    [facility] [address], [place], [place]  [address] Phone: [phone]; Fax: [phone]   

## 2018-06-22 NOTE — H&P (View-Only) (Signed)
Valve Clinic Consult Note  Chief Complaint  Patient presents with  . New Patient (Initial Visit)    severe aortic stenosis   History of Present Illness:85 yo male with history of diabetes mellitus, GERD, gout, hiatal hernia, HTN, hyperlipidemia, PAD and severe aortic stenosis who is here today as a new patient, referred by Dr. Swaziland, for further discussion regarding his aortic stenosis and possible TAVR. He was seen by Dr. Swaziland on 05/26/18 with c/o chest pain. This visit was also to provide clearance for cholecystectomy. 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 Echo in 2013 with mild aortic stenosis. He described mild chest pain with exertion. He also has had nausea and HIDA scan was abnormal. He will need to have cholecystectomy. Echo 05/30/18 with normal LV size and function, LVEF=55-65%. Wall motion was normal. The aortic valve leaflets are thickened and calcified with reduced mobility. Mean gradient 42 mmHg, peak gradient 67 mmHg, AVA 0.45 cm2, dimensionless index 0.14. Cardiac cath 06/13/18 with non-obstructive disease in the LAD and RCA. There is a moderately severe stenosis in the RV marginal branch.   He tells me today that he has dyspnea with exertion. He is very inactive. He has severe knee pain and uses a walker. He also has spinal stenosis. He has mild chest pain with exertion. He has fatigue. No lower extremity edema. He continues to have some nausea felt to be due to his gallbladder issues but this has been stable recently. He sees the dentist regularly. He has a broken tooth and is seeing his dentist next week. He lives with his wife. He is retired from [employer].   Primary Care Physician: Jarome Matin, MD Referring Cardiologist: Peter Swaziland Primary Cardiologist: Peter Swaziland  Past Medical History:  Diagnosis Date  . Acid reflux disease   . Arthritis of back   . CAD (coronary artery disease)   . Diabetes  mellitus, type 2 (HCC)   . History of gout   . History of hiatal hernia   . Hx of urinary tract infection   . Hypercholesterolemia   . Hypertension   . Osteoarthritis    left knee  . Pneumonia   . PVD (peripheral vascular disease) (HCC)   . Severe aortic stenosis     Past Surgical History:  Procedure Laterality Date  . ABDOMINAL AORTIC ANEURYSM REPAIR    . APPENDECTOMY    . FEMORAL-POPLITEAL BYPASS GRAFT     left  . LUMBAR SPINE SURGERY    . RIGHT/LEFT HEART CATH AND CORONARY ANGIOGRAPHY N/A 06/13/2018   Procedure: RIGHT/LEFT HEART CATH AND CORONARY ANGIOGRAPHY;  Surgeon: Swaziland, Peter M, MD;  Location: [facility] INVASIVE CV LAB;  Service: Cardiovascular;  Laterality: N/A;  . TOTAL KNEE ARTHROPLASTY     bilateral    Current Outpatient Medications  Medication Sig Dispense Refill  . allopurinol (ZYLOPRIM) 300 MG tablet Take 300 mg by mouth daily.     Marland Kitchen amLODipine (NORVASC) 5 MG tablet Take 5 mg by mouth daily.     Marland Kitchen aspirin 81 MG tablet Take 81 mg by mouth daily.    . dorzolamide-timolol (COSOPT) 22.3-6.8 MG/ML ophthalmic solution Place 1 drop into the left eye 2 (two) times daily.    . hydrocortisone cream 1 % Apply 1 application topically daily as needed for itching.    . insulin detemir (LEVEMIR) 100 UNIT/ML injection Inject 32 Units into the skin every morning.     Marland Kitchen losartan (COZAAR)  100 MG tablet Take 100 mg by mouth daily.     . Multiple Vitamin (MULTIVITAMIN WITH MINERALS) TABS tablet Take 2 tablets by mouth daily.    . pantoprazole (PROTONIX) 40 MG tablet Take 40 mg by mouth daily.    . pioglitazone (ACTOS) 15 MG tablet Take 15 mg by mouth daily.     . simvastatin (ZOCOR) 80 MG tablet Take 80 mg by mouth daily.     . sitaGLIPtin (JANUVIA) 50 MG tablet Take 50 mg by mouth daily.     No current facility-administered medications for this visit.     Allergies  Allergen Reactions  . Indocin [Indomethacin] Other (See Comments)    Antiinflammatory medication for gout ? Indocin  > black out per patient    Social History   Socioeconomic History  . Marital status: Married    Spouse name: Not on file  . Number of children: 4  . Years of education: Not on file  . Highest education level: Not on file  Occupational History  . Occupation: Art gallery manager- [employer]  Social Needs  . Financial resource strain: Not on file  . Food insecurity:    Worry: Not on file    Inability: Not on file  . Transportation needs:    Medical: Not on file    Non-medical: Not on file  Tobacco Use  . Smoking status: Former Smoker    Packs/day: 2.00    Years: 30.00    Pack years: 60.00    Types: Cigarettes    Last attempt to quit: 11/04/1984    Years since quitting: 33.6  . Smokeless tobacco: Never Used  Substance and Sexual Activity  . Alcohol use: Yes    Alcohol/week: 0.0 standard drinks    Comment: social  . Drug use: No  . Sexual activity: Not on file  Lifestyle  . Physical activity:    Days per week: Not on file    Minutes per session: Not on file  . Stress: Not on file  Relationships  . Social connections:    Talks on phone: Not on file    Gets together: Not on file    Attends religious service: Not on file    Active member of club or organization: Not on file    Attends meetings of clubs or organizations: Not on file    Relationship status: Not on file  . Intimate partner violence:    Fear of current or ex partner: Not on file    Emotionally abused: Not on file    Physically abused: Not on file    Forced sexual activity: Not on file  Other Topics Concern  . Not on file  Social History Narrative  . Not on file    Family History  Problem Relation Age of Onset  . Heart attack Mother   . Alzheimer's disease Father   . Cancer Sister     Review of Systems:  As stated in the HPI and otherwise negative.   BP 122/78   Pulse 90   Ht 5\' 10"  (1.778 m)   Wt 245 lb 6.4 oz (111.3 kg)   SpO2 97%   BMI 35.21 kg/m   Physical Examination: General: Well  developed, well nourished, NAD  HEENT: OP clear, mucus membranes moist  SKIN: warm, dry. No rashes. Neuro: No focal deficits  Musculoskeletal: Muscle strength 5/5 all ext  Psychiatric: Mood and affect normal  Neck: No JVD, no carotid bruits, no thyromegaly, no lymphadenopathy.  Lungs:Clear bilaterally,  no wheezes, rhonci, crackles Cardiovascular: Regular rate and rhythm. Loud, harsh systolic murmur.  Abdomen:Soft. Bowel sounds present. Non-tender.  Extremities: No lower extremity edema.   Cardiac cath 06/13/18:   Prox LAD lesion is 25% stenosed.  Ost RCA lesion is 40% stenosed.  Acute Mrg lesion is 80% stenosed.  LV end diastolic pressure is normal.  There is severe aortic valve stenosis.   1. Nonobstructive CAD except for 80% RV marginal branch 2. Severe calcific aortic stenosis. Mean gradient 45 mm Hg, peak to peak 66 mm Hg, AV area 0.9 cm squared with index 0.4 3. Normal right heart and LV filling pressures 4. Normal cardiac output. Diagnostic  Dominance: Right     Fick Cardiac Output 6.51 L/min  Fick Cardiac Output Index 2.85 (L/min)/BSA  Aortic Mean Gradient 44.4 mmHg  Aortic Peak Gradient 66 mmHg  Aortic Valve Area 0.92  Aortic Value Area Index 0.4 cm2/BSA  RA A Wave 6 mmHg  RA V Wave 5 mmHg  RA Mean 4 mmHg  RV Systolic Pressure 33 mmHg  RV Diastolic Pressure 0 mmHg  RV EDP 6 mmHg  PA Systolic Pressure 28 mmHg  PA Diastolic Pressure 8 mmHg  PA Mean 18 mmHg  PW A Wave 10 mmHg  PW V Wave 8 mmHg  PW Mean 9 mmHg  AO Systolic Pressure 142 mmHg  AO Diastolic Pressure 67 mmHg  AO Mean 97 mmHg  LV Systolic Pressure 215 mmHg  LV Diastolic Pressure -16 mmHg  LV EDP 15 mmHg  AOp Systolic Pressure 150 mmHg  AOp Diastolic Pressure 71 mmHg  AOp Mean Pressure 105 mmHg  LVp Systolic Pressure 216 mmHg  LVp Diastolic Pressure 0 mmHg  LVp EDP Pressure 14 mmHg  QP/QS 1  TPVR Index 6.31 HRUI  TSVR Index 34.01 HRUI  PVR SVR Ratio 0.1  TPVR/TSVR Ratio 0.19   Echo  05/30/18: Left ventricle: The cavity size was normal. Wall thickness was   normal. Systolic function was normal. The estimated ejection   fraction was in the range of 55% to 65%. Wall motion was normal;   there were no regional wall motion abnormalities. Doppler   parameters are consistent with abnormal left ventricular   relaxation (grade 1 diastolic dysfunction). - Aortic valve: There was severe stenosis. Mean gradient (S): 42 mm   Hg. Peak gradient (S): 67 mm Hg.  ------------------------------------------------------------------- Labs, prior tests, procedures, and surgery: Transthoracic echocardiography (06/13/2012).    The aortic valve showed mild stenosis.  EF was 55% and PA pressure was 31 (systolic). Aortic valve: peak gradient of 20 mm Hg and mean gradient of 31 mm Hg.  ------------------------------------------------------------------- Study data:  Comparison was made to the study of 06/13/2012. Strain imaging.  Study status:  Routine.  Procedure:  The patient reported no pain pre or post test. Transthoracic echocardiography. Image quality was adequate.  Study completion:  There were no complications.          Transthoracic echocardiography.  M-mode, complete 2D, spectral Doppler, and color Doppler.  Birthdate: Patient birthdate: [birth year].  Age:  Patient is 85 yr old.  Sex: Gender: male.    BMI: 35.5 kg/m^2.  Blood pressure:     116/63 Patient status:  Outpatient.  Study date:  Study date: 05/30/2018. Study time: 02:57 PM.  Location:  [facility]  -------------------------------------------------------------------  ------------------------------------------------------------------- Left ventricle:  GLS = -12.4%.  The cavity size was normal. Wall thickness was normal. Systolic function was normal. The estimated ejection fraction was in the range  of 55% to 65%. Wall motion was normal; there were no regional wall motion abnormalities. Doppler parameters are  consistent with abnormal left ventricular relaxation (grade 1 diastolic dysfunction).  ------------------------------------------------------------------- Aortic valve:   Moderately thickened, moderately calcified leaflets.  Doppler:   There was severe stenosis.      VTI ratio of LVOT to aortic valve: 0.14. Valve area (VTI): 0.45 cm^2. Indexed valve area (VTI): 0.19 cm^2/m^2. Peak velocity ratio of LVOT to aortic valve: 0.17. Valve area (Vmax): 0.54 cm^2. Indexed valve area (Vmax): 0.23 cm^2/m^2. Mean velocity ratio of LVOT to aortic valve: 0.15. Valve area (Vmean): 0.47 cm^2. Indexed valve area (Vmean): 0.2 cm^2/m^2.    Mean gradient (S): 42 mm Hg. Peak gradient (S): 67 mm Hg.  ------------------------------------------------------------------- Aorta:  Aortic root: The aortic root was normal in size. Ascending aorta: The ascending aorta was normal in size.  ------------------------------------------------------------------- Mitral valve:   Mildly thickened leaflets .  Doppler:   There was no evidence for stenosis.      Valve area by pressure half-time: 5.64 cm^2. Indexed valve area by pressure half-time: 2.35 cm^2/m^2.   ------------------------------------------------------------------- Left atrium:  The atrium was normal in size.  ------------------------------------------------------------------- Right ventricle:  The cavity size was normal. Systolic function was normal.  ------------------------------------------------------------------- Pulmonic valve:   Poorly visualized.  The valve appears to be grossly normal.    Doppler:  There was no significant regurgitation.  ------------------------------------------------------------------- Tricuspid valve:   The valve appears to be grossly normal. Doppler:  There was trivial regurgitation.  ------------------------------------------------------------------- Right atrium:  The atrium was normal in  size.  ------------------------------------------------------------------- Pericardium:  There was no pericardial effusion.  ------------------------------------------------------------------- Measurements   Left ventricle                            Value          Reference  LV ID, ED, PLAX chordal                   48    mm       43 - 52  LV ID, ES, PLAX chordal                   35    mm       23 - 38  LV fx shortening, PLAX chordal    (L)     27    %        >=29  LV PW thickness, ED                       10    mm       ---------  IVS/LV PW ratio, ED                       1              <=1.3  Stroke volume, 2D                         50    ml       ---------  Stroke volume/bsa, 2D                     21    ml/m^2   ---------  LV e&', lateral  5.25  cm/s     ---------  LV E/e&', lateral                          8.74           ---------  LV e&', medial                             3.98  cm/s     ---------  LV E/e&', medial                           11.53          ---------  LV e&', average                            4.62  cm/s     ---------  LV E/e&', average                          9.95           ---------    Ventricular septum                        Value          Reference  IVS thickness, ED                         10    mm       ---------    LVOT                                      Value          Reference  LVOT ID, S                                20    mm       ---------  LVOT area                                 3.14  cm^2     ---------  LVOT peak velocity, S                     70.5  cm/s     ---------  LVOT mean velocity, S                     46    cm/s     ---------  LVOT VTI, S                               15.9  cm       ---------    Aortic valve                              Value          Reference  Aortic valve peak velocity, S  409   cm/s     ---------  Aortic valve mean velocity, S             307   cm/s     ---------   Aortic valve VTI, S                       110   cm       ---------  Aortic mean gradient, S                   42    mm Hg    ---------  Aortic peak gradient, S                   67    mm Hg    ---------  VTI ratio, LVOT/AV                        0.14           ---------  Aortic valve area, VTI                    0.45  cm^2     ---------  Aortic valve area/bsa, VTI                0.19  cm^2/m^2 ---------  Velocity ratio, peak, LVOT/AV             0.17           ---------  Aortic valve area, peak velocity          0.54  cm^2     ---------  Aortic valve area/bsa, peak               0.23  cm^2/m^2 ---------  velocity  Velocity ratio, mean, LVOT/AV             0.15           ---------  Aortic valve area, mean velocity          0.47  cm^2     ---------  Aortic valve area/bsa, mean               0.2   cm^2/m^2 ---------  velocity    Aorta                                     Value          Reference  Aortic root ID, ED                        35    mm       ---------  Ascending aorta ID, A-P, S                34    mm       ---------    Left atrium                               Value          Reference  LA ID, A-P, ES                            36    mm       ---------  LA ID/bsa, A-P                            1.5   cm/m^2   <=2.2  LA volume, S                              44.1  ml       ---------  LA volume/bsa, S                          18.4  ml/m^2   ---------  LA volume, ES, 1-p A4C                    35.6  ml       ---------  LA volume/bsa, ES, 1-p A4C                14.9  ml/m^2   ---------  LA volume, ES, 1-p A2C                    49.9  ml       ---------  LA volume/bsa, ES, 1-p A2C                20.8  ml/m^2   ---------    Mitral valve                              Value          Reference  Mitral E-wave peak velocity               45.9  cm/s     ---------  Mitral A-wave peak velocity               103   cm/s     ---------  Mitral deceleration time          (L)     134   ms        150 - 230  Mitral pressure half-time                 39    ms       ---------  Mitral E/A ratio, peak                    0.4            ---------  Mitral valve area, PHT, DP                5.64  cm^2     ---------  Mitral valve area/bsa, PHT, DP            2.35  cm^2/m^2 ---------    Systemic veins                            Value          Reference  Estimated CVP                             3     mm Hg    ---------    Right ventricle  Value          Reference  TAPSE                                     19.4  mm       ---------  RV s&', lateral, S                         11.5  cm/s     ---------  CTA chest/abdomen/pelvis 06/19/18: Cardiovascular: Heart size is normal. There is no significant pericardial fluid, thickening or pericardial calcification. There is aortic atherosclerosis, as well as atherosclerosis of the great vessels of the mediastinum and the coronary arteries, including calcified atherosclerotic plaque in the left main, left anterior descending, left circumflex and right coronary arteries. Severe calcifications of the aortic valve.  Mediastinum/Lymph Nodes: No pathologically enlarged mediastinal or hilar lymph nodes. Small hiatal hernia. No axillary lymphadenopathy.  Lungs/Pleura: 1.8 cm calcified granuloma in the right lower lobe. Several other smaller calcified granulomas are also noted in the right lung. No other suspicious appearing pulmonary nodules or masses are noted. No acute consolidative airspace disease. No pleural effusions.  Musculoskeletal/Soft Tissues: There are no aggressive appearing lytic or blastic lesions noted in the visualized portions of the skeleton.  CTA ABDOMEN AND PELVIS FINDINGS  Hepatobiliary: Liver has a slightly shrunken appearance and nodular contour, indicative of underlying cirrhosis. No discrete cystic or solid hepatic lesions. No intra or extrahepatic biliary ductal dilatation. Small calcified  gallstones lying dependently in the neck of the gallbladder. No findings to suggest an acute cholecystitis at this time.  Pancreas: No pancreatic mass. No pancreatic ductal dilatation. No pancreatic or peripancreatic fluid or inflammatory changes.  Spleen: Numerous calcified granulomas in the spleen.  Adrenals/Urinary Tract: Low-attenuation lesions in the kidneys bilaterally, compatible with simple cysts, largest of which measures 5.2 cm in the interpolar region of the right kidney. Subcentimeter low-attenuation lesion in the lower pole the right kidney, too small to characterize, but statistically likely to represent a tiny cyst. Urothelial enhancement in the right renal pelvis. No hydroureteronephrosis. Urinary bladder is normal in appearance. Bilateral adrenal glands are normal in appearance.  Stomach/Bowel: Normal appearance of the stomach. No pathologic dilatation of small bowel or colon. Numerous colonic diverticulae are noted, with some inflammatory changes adjacent to a diverticulum in the descending colon (axial image 108 of series 15), which could indicate an acute diverticulitis. There is a short segment of the mid transverse colon which extends into an epigastric ventral hernia. The appendix is not confidently identified and may be surgically absent. Regardless, there are no inflammatory changes noted adjacent to the cecum to suggest the presence of an acute appendicitis at this time.  Vascular/Lymphatic: Aortic atherosclerosis. Postoperative changes of aorto bi-iliac graft placement. Today's study is considered nondiagnostic from a CTA perspective secondary to suboptimal contrast bolus. No lymphadenopathy noted in the abdomen or pelvis.  Reproductive: Prostate gland and seminal vesicles are unremarkable in appearance.  Other: Moderate-sized epigastric ventral hernia containing a short segment of the mid transverse colon. There is also a  smaller supraumbilical ventral hernia containing only omental fat immediately above the level of the umbilicus. No significant volume of ascites. No pneumoperitoneum.  Musculoskeletal: There are no aggressive appearing lytic or blastic lesions noted in the visualized portions of the skeleton.  VASCULAR MEASUREMENTS PERTINENT TO TAVR:  AORTA:  Minimal Aortic Diameter-18 x  19 mm  Severity of Aortic Calcification-moderate  Nondiagnostic assessment of pelvic vasculature secondary to suboptimal contrast bolus.  IMPRESSION: 1. Today's study is unfortunately nondiagnostic in terms of CTA assessment of the pelvic vasculature for upcoming TAVR procedure. Repeat CTA of the abdomen and pelvis is recommended prior to the procedure for full assessment of the pelvic arterial access vessels. 2. Severe calcifications of the aortic valve, compatible with the reported clinical history of severe aortic stenosis. 3. Colonic diverticulosis with inflammatory changes adjacent to the descending colon, concerning for an acute diverticulitis. Further clinical evaluation is recommended. 4. Cholelithiasis without evidence of acute cholecystitis at this time. 5. Two ventral hernias, one of which in epigastric region contains a short segment of the mid transverse colon. No findings to suggest acute bowel incarceration or obstruction at this time. 6. Aortic atherosclerosis, in addition to left main and 3 vessel coronary artery disease. 7. Additional incidental findings, as above.  Gated Cardiac CTA 06/19/18: FINDINGS: Aortic Valve: Tri leaflet and calcified with restricted leaflet motion  Aorta: Moderate calcific atherosclerosis with bovine arch no aneurysm  Sinotubular Junction: 30 mm  Ascending Thoracic Aorta: 34 mm  Aortic Arch: 28 mm  Descending Thoracic Aorta: 28 mm  Sinus of Valsalva Measurements:  Non-coronary: 31.5 mm  Right - coronary: 30.2 mm  Left - coronary: 31  mm  Coronary Artery Height above Annulus:  Left Main: 15.8 mm above annulus  Right Coronary: 15.6 mm above annulus  Virtual Basal Annulus Measurements:  Maximum/Minimum Diameter: 27 mm x 23.3 mm  Perimeter: 82 mm  Area: 503 mm2  Coronary Arteries: Sufficient height above annulus for deployment  Optimum Fluoroscopic Angle for Delivery: LAO 11 Caudal 7 degrees  IMPRESSION: 1. Tri leaflet AV with annulus of 503 mm 2 suitable for a 26 mm Sapien 3 valve  2.  Coronary arteries sufficient height above annulus for deployment  3. Optimum angiographic angle for deployment LAO 11 Caudal 7 degrees  4.  No LAA thrombus  5. Bovine arch with normal aortic root diameter moderate calcific aortic atherosclerosis  EKG:  EKG is not ordered today. The ekg ordered today demonstrates   Recent Labs: 06/01/2018: BUN 21; Creatinine, Ser 1.58; Potassium 4.3; Sodium 143 06/13/2018: Hemoglobin 13.2; Platelets 99   Lipid Panel No results found for: CHOL, TRIG, HDL, CHOLHDL, VLDL, LDLCALC, LDLDIRECT   Wt Readings from Last 3 Encounters:  06/22/18 245 lb 6.4 oz (111.3 kg)  06/13/18 246 lb (111.6 kg)  05/26/18 247 lb 9.6 oz (112.3 kg)     Other studies Reviewed: Additional studies/ records that were reviewed today include: Echo images, cath images.  Review of the above records demonstrates: severe AS   Assessment and Plan:   1. Severe aortic stenosis: He has severe, stage D aortic valve stenosis. I have personally reviewed the echo images. The aortic valve is thickened, calcified with limited leaflet mobility. I think he would benefit from AVR. Given advanced age, he is not a good candidate for conventional AVR by surgical approach. I think he may be a good candidate for TAVR.   STS Risk Score:  Procedure: Isolated AVR  Risk of Mortality: 4.913%  Renal Failure: 9.571%  Permanent Stroke: 1.622%  Prolonged Ventilation: 13.384%  DSW Infection: 0.319%  Reoperation: 5.229%   Morbidity or Mortality: 22.013%  Short Length of Stay: 19.713%  Long Length of Stay: 12.794%   I have reviewed the natural history of aortic stenosis with the patient and their family members  who are present today.  We have discussed the limitations of medical therapy and the poor prognosis associated with symptomatic aortic stenosis. We have reviewed potential treatment options, including palliative medical therapy, conventional surgical aortic valve replacement, and transcatheter aortic valve replacement. We discussed treatment options in the context of the patient's specific comorbid medical conditions.    He would like to proceed with planning for TAVR. He has completed his cardiac cath and gated cardiac CTA. Will have to repeat the CTA of the chest/abdomen and pelvis due to inadequate imaging of the aorto-iliac vessels on the scan on 06/19/18. Will arrange carotid dopplers and PFTs on the same day as the repeat scan. Risks and benefits of the TAVR procedure reviewed with the patient. He will be referred to see one of the CT surgeons on our TAVR team next week. He will likely require alternative access for TAVR given his severe aorto-iliac disease with prior bypass. 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 He is overall having minimal symptoms at this time in relation to his gallbladder. I will review this with our TAVR team.   Current medicines are reviewed at length with the patient today.  The patient does not have concerns regarding medicines.  The following changes have been made:  no change  Labs/ tests ordered today include:   Orders Placed This Encounter  Procedures  . Basic Metabolic Panel (BMET)   Disposition:   FU with the TAVR team.    Signed, Verne Carrow, MD 06/22/2018 1:39 PM    [facility] [address] [place], [place], [place]  [address] Phone: [phone]; Fax: [phone]

## 2018-06-22 NOTE — Patient Instructions (Signed)
Medication Instructions:  Your physician recommends that you continue on your current medications as directed. Please refer to the Current Medication list given to you today.  If you need a refill on your cardiac medications before your next appointment, please call your pharmacy.   Lab work: Lab work to be done today--BMP If you have labs (blood work) drawn today and your tests are completely normal, you will receive your results only by: Marland Kitchen MyChart Message (if you have MyChart) OR . A paper copy in the mail If you have any lab test that is abnormal or we need to change your treatment, we will call you to review the results.  Testing/Procedures: See letter you were given today

## 2018-06-23 LAB — BASIC METABOLIC PANEL
BUN/Creatinine Ratio: 22 (ref 10–24)
BUN: 42 mg/dL — ABNORMAL HIGH (ref 8–27)
CO2: 21 mmol/L (ref 20–29)
Calcium: 9.1 mg/dL (ref 8.6–10.2)
Chloride: 108 mmol/L — ABNORMAL HIGH (ref 96–106)
Creatinine, Ser: 1.93 mg/dL — ABNORMAL HIGH (ref 0.76–1.27)
GFR calc non Af Amer: 31 mL/min/{1.73_m2} — ABNORMAL LOW (ref >59)
GFR, EST AFRICAN AMERICAN: 36 mL/min/{1.73_m2} — AB (ref >59)
Glucose: 147 mg/dL — ABNORMAL HIGH (ref 65–99)
Potassium: 4.9 mmol/L (ref 3.5–5.2)
Sodium: 144 mmol/L (ref 134–144)

## 2018-06-27 ENCOUNTER — Ambulatory Visit (HOSPITAL_COMMUNITY)
Admission: RE | Admit: 2018-06-27 | Discharge: 2018-06-27 | Disposition: A | Discharge status: Home / Self Care | Payer: Medicare Other | Source: Ambulatory Visit | Attending: Cardiovascular Disease | Admitting: Cardiovascular Disease

## 2018-06-27 ENCOUNTER — Telehealth (HOSPITAL_COMMUNITY): Payer: Self-pay | Admitting: Emergency Medicine

## 2018-06-27 ENCOUNTER — Ambulatory Visit (HOSPITAL_BASED_OUTPATIENT_CLINIC_OR_DEPARTMENT_OTHER)
Admission: RE | Admit: 2018-06-27 | Discharge: 2018-06-27 | Disposition: A | Discharge status: Home / Self Care | Payer: Medicare Other | Source: Ambulatory Visit | Attending: Cardiovascular Disease | Admitting: Cardiovascular Disease

## 2018-06-27 DIAGNOSIS — I350 Nonrheumatic aortic (valve) stenosis: Secondary | ICD-10-CM

## 2018-06-27 DIAGNOSIS — N289 Disorder of kidney and ureter, unspecified: Secondary | ICD-10-CM | POA: Insufficient documentation

## 2018-06-27 LAB — PULMONARY FUNCTION TEST
DL/VA % pred: 73 %
DL/VA: 3.34 ml/min/mmHg/L
DLCO UNC % PRED: 52 %
DLCO unc: 16.93 ml/min/mmHg
FEF 25-75 Post: 1.62 L/sec
FEF 25-75 Pre: 1.19 L/sec
FEF2575-%Change-Post: 35 %
FEF2575-%Pred-Post: 91 %
FEF2575-%Pred-Pre: 67 %
FEV1-%Change-Post: 4 %
FEV1-%Pred-Post: 75 %
FEV1-%Pred-Pre: 72 %
FEV1-POST: 2.05 L
FEV1-Pre: 1.96 L
FEV1FVC-%Change-Post: -7 %
FEV1FVC-%Pred-Pre: 99 %
FEV6-%Change-Post: 9 %
FEV6-%Pred-Post: 82 %
FEV6-%Pred-Pre: 75 %
FEV6-Post: 2.96 L
FEV6-Pre: 2.7 L
FEV6FVC-%Change-Post: -2 %
FEV6FVC-%Pred-Post: 102 %
FEV6FVC-%Pred-Pre: 104 %
FVC-%Change-Post: 12 %
FVC-%Pred-Post: 81 %
FVC-%Pred-Pre: 72 %
FVC-Post: 3.13 L
FVC-Pre: 2.78 L
Post FEV1/FVC ratio: 66 %
Post FEV6/FVC ratio: 95 %
Pre FEV1/FVC ratio: 71 %
Pre FEV6/FVC Ratio: 97 %
RV % pred: 343 %
RV: 9.41 L
TLC % pred: 174 %
TLC: 12.34 L

## 2018-06-27 MED ORDER — SODIUM BICARBONATE 8.4 % IV SOLN
INTRAVENOUS | Status: AC
  Administered 2018-06-27: 12:00:00 via INTRAVENOUS
  Filled 2018-06-27: qty 500

## 2018-06-27 MED ORDER — IOPAMIDOL (ISOVUE-370) INJECTION 76%
100.0000 mL | Freq: Once | INTRAVENOUS | Status: AC | PRN
  Administered 2018-06-27: 100 mL via INTRAVENOUS

## 2018-06-27 MED ORDER — ALBUTEROL SULFATE (2.5 MG/3ML) 0.083% IN NEBU
2.5000 mg | INHALATION_SOLUTION | Freq: Once | RESPIRATORY_TRACT | Status: AC
  Administered 2018-06-27: 2.5 mg via RESPIRATORY_TRACT

## 2018-06-27 MED ORDER — SODIUM BICARBONATE BOLUS VIA INFUSION
INTRAVENOUS | Status: AC
  Administered 2018-06-27: 75 meq via INTRAVENOUS
  Filled 2018-06-27: qty 1

## 2018-06-27 NOTE — Progress Notes (Signed)
Carotid duplex       has been completed. Preliminary results can be found under CV proc through chart review. Jeb Levering, BS, RDMS, RVT    Text paged Dr. Clifton James with positive results

## 2018-06-27 NOTE — Telephone Encounter (Signed)
error 

## 2018-06-29 ENCOUNTER — Institutional Professional Consult (permissible substitution): Payer: Medicare Other | Admitting: Thoracic Surgery (Cardiothoracic Vascular Surgery)

## 2018-06-29 ENCOUNTER — Encounter (HOSPITAL_COMMUNITY): Payer: Medicare Other

## 2018-06-29 ENCOUNTER — Encounter: Payer: Self-pay | Admitting: Thoracic Surgery (Cardiothoracic Vascular Surgery)

## 2018-06-29 ENCOUNTER — Encounter: Payer: Self-pay | Admitting: Physical Therapy

## 2018-06-29 ENCOUNTER — Ambulatory Visit: Payer: Medicare Other | Attending: Cardiovascular Disease | Admitting: Physical Therapy

## 2018-06-29 ENCOUNTER — Other Ambulatory Visit: Payer: Self-pay

## 2018-06-29 VITALS — BP 108/78 | HR 77 | Resp 18 | Ht 70.0 in | Wt 240.0 lb

## 2018-06-29 DIAGNOSIS — R2689 Other abnormalities of gait and mobility: Secondary | ICD-10-CM | POA: Insufficient documentation

## 2018-06-29 DIAGNOSIS — I350 Nonrheumatic aortic (valve) stenosis: Secondary | ICD-10-CM | POA: Diagnosis not present

## 2018-06-29 NOTE — Therapy (Signed)
[facility] [address] [place], [place], [address] Phone: [phone]   Fax:  [phone]  Physical Therapy Evaluation  Patient Details  Name: [name] [name] MRN: [id number] Date of Birth: [birth year] Referring Provider (PT): Verne Carrowhristopher McAlhany MD   Encounter Date: 06/29/2018  PT End of Session - 06/29/18 1435    Visit Number  1    Number of Visits  1    Date for PT Re-Evaluation  06/29/18    PT Start Time  1358    PT Stop Time  1430    PT Time Calculation (min)  32 min    Equipment Utilized During Treatment  Gait belt    Activity Tolerance  Patient tolerated treatment well;Patient limited by fatigue    Behavior During Therapy  [facility] for tasks assessed/performed       Past Medical History:  Diagnosis Date  . Acid reflux disease   . Arthritis of back   . CAD (coronary artery disease)   . Diabetes mellitus, type 2 (HCC)   . History of gout   . History of hiatal hernia   . Hx of urinary tract infection   . Hypercholesterolemia   . Hypertension   . Osteoarthritis    left knee  . Pneumonia   . PVD (peripheral vascular disease) (HCC)   . Severe aortic stenosis     Past Surgical History:  Procedure Laterality Date  . ABDOMINAL AORTIC ANEURYSM REPAIR    . APPENDECTOMY    . FEMORAL-POPLITEAL BYPASS GRAFT     left  . LUMBAR SPINE SURGERY    . RIGHT/LEFT HEART CATH AND CORONARY ANGIOGRAPHY N/A 06/13/2018   Procedure: RIGHT/LEFT HEART CATH AND CORONARY ANGIOGRAPHY;  Surgeon: Swazil[place]Jordan, Peter M, MD;  [facility];  Service: Cardiovascular;  Laterality: N/A;  . TOTAL KNEE ARTHROPLASTY     bilateral    There were no vitals filed for this visit.   Subjective Assessment - 06/29/18 1401    Subjective  pt is a 85 y.o with CC of SOB and chest pain that occurs with walking/standing activities. pt reported SOB and CC that started from 6 -12 months ago.  Pt reports pain inthe low back and bil knee which he has had  replaced in the past.     Patient Stated Goals  to get the heart better    Currently in Pain?  Yes    Pain Score  4     Pain Location  Knee    Pain Orientation  Right;Left    Pain Descriptors / Indicators  Aching;Sore    Pain Type  Chronic pain    Pain Onset  More than a month ago    Pain Frequency  Constant    Aggravating Factors   standing/ walking    Pain Relieving Factors  sitting and resting     Multiple Pain Sites  Yes    Pain Score  4    Pain Location  Back    Pain Orientation  Mid;Lower    Pain Type  Chronic pain    Pain Onset  More than a month ago    Pain Frequency  Constant    Aggravating Factors   standing/ walking     Pain Relieving Factors  resting         [facility] PT Assessment - 06/29/18 0001      Assessment   Medical Diagnosis  severe aortic stenosis    Referring Provider (PT)  Verne Carrowhristopher McAlhany  MD    Onset Date/Surgical Date  --   6-12 months   Hand Dominance  Right      Precautions   Precautions  None      Restrictions   Weight Bearing Restrictions  No      Balance Screen   Has the patient fallen in the past 6 months  No    Has the patient had a decrease in activity level because of a fear of falling?   No    Is the patient reluctant to leave their home because of a fear of falling?   No      Home Public house manager residence    Living Arrangements  Spouse/significant other    Available Help at Discharge  Family    Type of Home  House    Home Access  Stairs to enter    Entrance Stairs-Number of Steps  2    Entrance Stairs-Rails  None    Home Layout  Two level    Alternate Level Stairs-Number of Steps  13    Alternate Level Stairs-Rails  Right;Left    Home Research officer, trade union - single point   rollator     Prior Function   Level of Independence  Independent with basic ADLs    Vocation  Retired      IT consultant   Overall Cognitive Status  Within Functional Limits for tasks assessed      ROM / Strength    AROM / PROM / Strength  AROM;PROM;Strength      AROM   Overall AROM   Within functional limits for tasks performed      Strength   Overall Strength  Within functional limits for tasks performed    Right Hand Grip (lbs)  65    Left Hand Grip (lbs)  63      Ambulation/Gait   Ambulation/Gait  Yes    Assistive device  Straight cane    Gait Pattern  Step-through pattern;Trendelenburg;Antalgic;Trunk flexed;Decreased stride length       [facility] Pre-Surgical Assessment - 06/29/18 0001    5 Meter Walk Test- trial 1  8 sec    5 Meter Walk Test- trial 2  8 sec.     5 Meter Walk Test- trial 3  9 sec.    5 meter walk test average  8.33 sec    4 Stage Balance Test tolerated for:   8 sec.    4 Stage Balance Test Position  2    Comment  unable to perofrm    ADL/IADL Independent with:  Bathing;Dressing;Finances;Yard work    ADL/IADL Needs Assistance with:  Meal prep    ADL/IADL Fraility Index  Midly frail    6 Minute Walk- Baseline  yes    BP (mmHg)  (!) 129/95    HR (bpm)  74    02 Sat (%RA)  97 %    Modified Borg Scale for Dyspnea  2- Mild shortness of breath    Perceived Rate of Exertion (Borg)  11- Fairly light    6 Minute Walk Post Test  yes    BP (mmHg)  174/88    HR (bpm)  121    02 Sat (%RA)  94 %    Modified Borg Scale for Dyspnea  4- somewhat severe    Perceived Rate of Exertion (Borg)  14-    Aerobic Endurance Distance Walked  370    Endurance additional comments  pt demonstrates  72.95 % limitation compared to age related norm              Objective measurements completed on examination: See above findings.              PT Education - 06/29/18 1434    Education Details  Discussed frailty score and when talking to be sure to scan environment and to avoid rushing to prevent potential tripping/ falling.     Person(s) Educated  Patient;Child(ren)    Methods  Explanation;Verbal cues    Comprehension  Verbalized understanding                  Plan  - 06/29/18 1436    Clinical Impression Statement  see assessment in note    Clinical Presentation  Stable    Clinical Decision Making  Low    Rehab Potential  Good    PT Frequency  One time visit    PT Next Visit Plan  pre-TAVR evaluation    Consulted and Agree with Plan of Care  Patient;Family member/caregiver    Family Member Consulted  son        Clinical Impression Statement: Pt is a 85 yo M presenting to OP PT for evaluation prior to possible TAVR surgery due to severe aortic stenosis. Pt reports onset of SOB and Chest pain approximately 6-12 months ago. Symptoms are limiting standing/walking enduracce/ speed. Pt presents with good ROM and strength, limited balance and is a significant fall risk at high fall risk 4 stage balance test, limited walking speed and limited aerobic endurance per 6 minute walk test. Pt ambulated 370 feet with SPC in 3:47 before requesting a seated rest beak lasting the remainder of the 6 min walk test. At time of rest, patient's HR was 121 bpm and O2 was 94% on room air. Pt reported 4/10 shortness of breath on modified scale for dyspnea.Pt ambulated a total of 370 feet in 6 minute walk. SOB, chest tightness, bil knee and low back pain increased significantly with 6 minute walk test. Based on the Short Physical Performance Battery, patient has a frailty rating of 3/12 with </= 5/12 considered frail.    Patient demonstrated the following deficits and impairments:     Visit Diagnosis: Other abnormalities of gait and mobility     Problem List Patient Active Problem List   Diagnosis Date Noted  . Aortic stenosis, severe 06/13/2018  . Incisional hernia, without obstruction or gangrene 10/29/2012  . Gall stones 10/26/2012  . Aftercare following surgery of the circulatory system, NEC 10/24/2012  . Peripheral vascular disease, unspecified (HCC) 10/24/2012  . Precordial chest pain 06/02/2012  . Murmur, cardiac 06/02/2012  . Hypertension   . PVD (peripheral  vascular disease) (HCC)   . Diabetes mellitus, type 2 (HCC)   . Hypercholesterolemia    Lulu RidingKristoffer Adriel Kessen PT, DPT, LAT, ATC  06/29/18  2:43 PM      [facility] [address] [place], [place], [address] Phone: [phone]   Fax:  [phone]  Name: [name] [name] MRN: [id number] Date of Birth: 04-28-1932

## 2018-06-29 NOTE — Progress Notes (Signed)
[facility]  MULTIDISCIPLINARY HEART VALVE CLINIC  CARDIOTHORACIC SURGERY CONSULTATION REPORT  Referring Provider is [place]il[place], Peter M, MD PCP is Jarome Matin, MD  Chief Complaint  Patient presents with  . Aortic Stenosis    Surgical eval for TAVR review all studies    HPI:  Patient is an 85 year old morbidly obese male with history of aortic stenosis, type 2 diabetes mellitus on insulin, GE reflux disease with hiatal hernia, cholelithiasis, diverticulosis, ventral incisional hernia, hypertension, OSA on CPAP, peripheral arterial disease, and remote history of previous aortobiiliac grafting for abdominal aortic aneurysm who has been referred for surgical consultation to discuss treatment options for management of severe symptomatic aortic stenosis.  Patient is morbidly obese and has been very sedentary for many years.  He has very limited physical activity.  He admits to a long history of exertional shortness of breath.  He has had off-and-on problems with abdominal pain and nausea for the past year and a half.  Gallbladder ultrasound revealed cholelithiasis without evidence of cholecystitis.  Upper GI barium contrast study was unrevealing.  The patient eventually underwent a gallbladder nuclear scan which revealed a low gallbladder ejection fraction.  The possibility of elective cholecystectomy was considered.  Because of the presence of a heart murmur on physical exam and atypical symptoms of chest pain patient was referred for cardiology clearance.  The patient had previously seen Dr. Swaziland in the remote past and underwent a stress Myoview exam in 2014 that was felt to be low risk.  Echocardiogram performed at that time revealed mild aortic stenosis with normal left ventricular systolic function.  Patient was seen in follow-up by Dr. Swaziland on May 26, 2018 at the request of the patient's primary care physician for preoperative clearance for possible cholecystectomy.   Follow-up echocardiogram performed May 30, 2018 revealed severe aortic stenosis with preserved left ventricular systolic function.  Peak velocity across aortic valve measured 4.1 m/s corresponding to mean transvalvular gradient estimated 42 mmHg.  The DVI was notably quite low at 0.17 with aortic valve area calculated only 0.54 cm.  Left ventricular ejection fraction was estimated 55 to 60%.  Diagnostic cardiac catheterization performed June 13, 2018 revealed mild nonobstructive coronary artery disease with exception of 80% stenosis of her RV marginal branch.  Catheterization confirmed the presence of severe aortic stenosis with peak to peak and mean transvalvular gradients measured 66 and 45 mmHg, respectively.  Aortic valve area was calculated 0.9 cm.  There was normal right-sided pressures and normal cardiac output.  The patient was referred to the multidisciplinary heart valve clinic and has been evaluated previously by Dr. Clifton James.  CT angiography was performed and cardiothoracic surgical consultation requested.  Patient is married and lives locally in [place] with his wife.  He has 4 adult children, and 1 of his sons accompanies him for his office consultation visit today.  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  He lives a very sedentary lifestyle.  He has significant degenerative arthritis and uses a cane or walker for ambulation.  He does not get around much.  He has been morbidly obese for much of his adult life.  He describes stable symptoms of exertional shortness of breath that occur with moderate level activity.  He denies any resting shortness of breath, PND, or orthopnea.  He has not had any exertional chest pain or chest tightness.  He describes chronic abdominal pain that has been going on for more than a  year.  He states that presently his pain is dull and located in the mid upper abdomen.  Pain is not related to meals and does not  seem to wax or wane.  It is not described as crampy as nature.  He has not had any fevers or chills.  He is aware of his ventral hernia but he denies any history of symptoms suggestive of incarceration.  He does not has problems with chronic constipation.  He has not had lower abdominal pain or any recent history of fevers or chills.  Denies any history of hematochezia, hematemesis, or melena.  Past Medical History:  Diagnosis Date  . Acid reflux disease   . Arthritis of back   . CAD (coronary artery disease)   . Diabetes mellitus, type 2 (HCC)   . History of gout   . History of hiatal hernia   . Hx of urinary tract infection   . Hypercholesterolemia   . Hypertension   . Osteoarthritis    left knee  . Pneumonia   . PVD (peripheral vascular disease) (HCC)   . Severe aortic stenosis     Past Surgical History:  Procedure Laterality Date  . ABDOMINAL AORTIC ANEURYSM REPAIR    . APPENDECTOMY    . FEMORAL-POPLITEAL BYPASS GRAFT     left  . LUMBAR SPINE SURGERY    . RIGHT/LEFT HEART CATH AND CORONARY ANGIOGRAPHY N/A 06/13/2018   Procedure: RIGHT/LEFT HEART CATH AND CORONARY ANGIOGRAPHY;  Surgeon: Swaziland, Peter M, MD;  Location: [facility] INVASIVE CV LAB;  Service: Cardiovascular;  Laterality: N/A;  . TOTAL KNEE ARTHROPLASTY     bilateral    Family History  Problem Relation Age of Onset  . Heart attack Mother   . Alzheimer's disease Father   . Cancer Sister     Social History   Socioeconomic History  . Marital status: Married    Spouse name: Not on file  . Number of children: 4  . Years of education: Not on file  . Highest education level: Not on file  Occupational History  . Occupation: Art gallery manager- [employer]  Social Needs  . Financial resource strain: Not on file  . Food insecurity:    Worry: Not on file    Inability: Not on file  . Transportation needs:    Medical: Not on file    Non-medical: Not on file  Tobacco Use  . Smoking status: Former Smoker    Packs/day:  2.00    Years: 30.00    Pack years: 60.00    Types: Cigarettes    Last attempt to quit: 11/04/1984    Years since quitting: 33.6  . Smokeless tobacco: Never Used  Substance and Sexual Activity  . Alcohol use: Yes    Alcohol/week: 0.0 standard drinks    Comment: social  . Drug use: No  . Sexual activity: Not on file  Lifestyle  . Physical activity:    Days per week: Not on file    Minutes per session: Not on file  . Stress: Not on file  Relationships  . Social connections:    Talks on phone: Not on file    Gets together: Not on file    Attends religious service: Not on file    Active member of club or organization: Not on file    Attends meetings of clubs or organizations: Not on file    Relationship status: Not on file  . Intimate partner violence:    Fear of current or ex  partner: Not on file    Emotionally abused: Not on file    Physically abused: Not on file    Forced sexual activity: Not on file  Other Topics Concern  . Not on file  Social History Narrative  . Not on file    Current Outpatient Medications  Medication Sig Dispense Refill  . allopurinol (ZYLOPRIM) 300 MG tablet Take 300 mg by mouth daily.     Marland Kitchen amLODipine (NORVASC) 5 MG tablet Take 5 mg by mouth daily.     Marland Kitchen aspirin 81 MG tablet Take 81 mg by mouth daily.    . dorzolamide-timolol (COSOPT) 22.3-6.8 MG/ML ophthalmic solution Place 1 drop into the left eye 2 (two) times daily.    . hydrocortisone cream 1 % Apply 1 application topically daily as needed for itching.    . insulin detemir (LEVEMIR) 100 UNIT/ML injection Inject 32 Units into the skin every morning.     Marland Kitchen losartan (COZAAR) 100 MG tablet Take 100 mg by mouth daily.     . Multiple Vitamin (MULTIVITAMIN WITH MINERALS) TABS tablet Take 2 tablets by mouth daily.    . pantoprazole (PROTONIX) 40 MG tablet Take 40 mg by mouth daily.    . pioglitazone (ACTOS) 15 MG tablet Take 15 mg by mouth daily.     . simvastatin (ZOCOR) 80 MG tablet Take 80 mg by  mouth daily.     . sitaGLIPtin (JANUVIA) 50 MG tablet Take 50 mg by mouth daily.     No current facility-administered medications for this visit.     Allergies  Allergen Reactions  . Indocin [Indomethacin] Other (See Comments)    Antiinflammatory medication for gout ? Indocin > black out per patient      Review of Systems:   General:  decreased appetite, decreased energy, no weight gain, no weight loss, no fever  Cardiac:  no chest pain with exertion, no chest pain at rest, +SOB with exertion, no resting SOB, no PND, no orthopnea, no palpitations, no arrhythmia, no atrial fibrillation, no LE edema, no dizzy spells, no syncope  Respiratory:  + shortness of breath, no home oxygen, no productive cough, no dry cough, no bronchitis, no wheezing, no hemoptysis, no asthma, no pain with inspiration or cough, + sleep apnea, + CPAP at night  GI:   no difficulty swallowing, no reflux, no frequent heartburn, no hiatal hernia, + abdominal pain, no constipation, no diarrhea, no hematochezia, no hematemesis, no melena  GU:   no dysuria,  no frequency, no urinary tract infection, no hematuria, no enlarged prostate, no kidney stones, no kidney disease  Vascular:  no pain suggestive of claudication, no pain in feet, no leg cramps, no varicose veins, no DVT, no non-healing foot ulcer  Neuro:   no stroke, no TIA's, no seizures, occasional headaches, no temporary blindness one eye,  no slurred speech, no peripheral neuropathy, no chronic pain, + instability of gait, no memory/cognitive dysfunction  Musculoskeletal: + arthritis, no joint swelling, no myalgias, + difficulty walking, limited mobility   Skin:   + rash, no itching, no skin infections, no pressure sores or ulcerations  Psych:   no anxiety, no depression, no nervousness, no unusual recent stress  Eyes:   no blurry vision, + floaters, no recent vision changes, no wears glasses or contacts  ENT:   no hearing loss, no loose or painful teeth, no  dentures, last saw dentist 3 days ago  Hematologic:  no easy bruising, no abnormal bleeding, no clotting disorder,  no frequent epistaxis  Endocrine:  + diabetes, does check CBG's at home           Physical Exam:   BP 108/78 (BP Location: Right Arm, Patient Position: Sitting, Cuff Size: Large)   Pulse 77   Resp 18   Ht 5\' 10"  (1.778 m)   Wt 240 lb (108.9 kg)   SpO2 96% Comment: RA  BMI 34.44 kg/m   General:  Morbidly obese male NAD   HEENT:  Unremarkable   Neck:   no JVD, no bruits, no adenopathy   Chest:   clear to auscultation, symmetrical breath sounds, no wheezes, no rhonchi   CV:   RRR, grade III/VI crescendo/decrescendo murmur heard best at RSB,  no diastolic murmur  Abdomen:  Soft, very large with panniculus, mildly tender to deep palpation, large ventral incisional hernia  Extremities:  warm, well-perfused, pulses not palpable, no LE edema  Rectal/GU  Deferred  Neuro:   Grossly non-focal and symmetrical throughout  Skin:   Clean and dry, no rashes, no breakdown   Diagnostic Tests:  Transthoracic Echocardiography  Patient:    Rita OharaHooper, Avraj MR #:       [id number] Study Date: 05/30/2018 Gender:     M Age:        89 Height:     177.8 cm Weight:     112.3 kg BSA:        2.4 m^2 Pt. Status: Room:   ORDERING     Peter SwazilandJordan, M.D.  REFERRING    Peter SwazilandJordan, M.D.  ATTENDING    Kristeen MissPhilip Nahser, M.D.  SONOGRAPHER  Dewitt HoesBilly Chung, RDCS  PERFORMING   [facility], Outpatient  cc:  ------------------------------------------------------------------- LV EF: 55% -   65%  ------------------------------------------------------------------- Indications:      Murmur (R01.1).  ------------------------------------------------------------------- History:   PMH:   Murmur.  Risk factors:  Former tobacco use. Hypertension. Diabetes mellitus. Dyslipidemia.  ------------------------------------------------------------------- Study Conclusions  - Left ventricle: The cavity size  was normal. Wall thickness was   normal. Systolic function was normal. The estimated ejection   fraction was in the range of 55% to 65%. Wall motion was normal;   there were no regional wall motion abnormalities. Doppler   parameters are consistent with abnormal left ventricular   relaxation (grade 1 diastolic dysfunction). - Aortic valve: There was severe stenosis. Mean gradient (S): 42 mm   Hg. Peak gradient (S): 67 mm Hg.  ------------------------------------------------------------------- Labs, prior tests, procedures, and surgery: Transthoracic echocardiography (06/13/2012).    The aortic valve showed mild stenosis.  EF was 55% and PA pressure was 31 (systolic). Aortic valve: peak gradient of 20 mm Hg and mean gradient of 31 mm Hg.  ------------------------------------------------------------------- Study data:  Comparison was made to the study of 06/13/2012. Strain imaging.  Study status:  Routine.  Procedure:  The patient reported no pain pre or post test. Transthoracic echocardiography. Image quality was adequate.  Study completion:  There were no complications.          Transthoracic echocardiography.  M-mode, complete 2D, spectral Doppler, and color Doppler.  Birthdate: Patient birthdate: Oct 14, 1931.  Age:  Patient is 85 yr old.  Sex: Gender: male.    BMI: 35.5 kg/m^2.  Blood pressure:     116/63 Patient status:  Outpatient.  Study date:  Study date: 05/30/2018. Study time: 02:57 PM.  Location:  [facility]  -------------------------------------------------------------------  ------------------------------------------------------------------- Left ventricle:  GLS = -12.4%.  The cavity size was normal. Wall thickness was normal.  Systolic function was normal. The estimated ejection fraction was in the range of 55% to 65%. Wall motion was normal; there were no regional wall motion abnormalities. Doppler parameters are consistent with abnormal left ventricular  relaxation (grade 1 diastolic dysfunction).  ------------------------------------------------------------------- Aortic valve:   Moderately thickened, moderately calcified leaflets.  Doppler:   There was severe stenosis.      VTI ratio of LVOT to aortic valve: 0.14. Valve area (VTI): 0.45 cm^2. Indexed valve area (VTI): 0.19 cm^2/m^2. Peak velocity ratio of LVOT to aortic valve: 0.17. Valve area (Vmax): 0.54 cm^2. Indexed valve area (Vmax): 0.23 cm^2/m^2. Mean velocity ratio of LVOT to aortic valve: 0.15. Valve area (Vmean): 0.47 cm^2. Indexed valve area (Vmean): 0.2 cm^2/m^2.    Mean gradient (S): 42 mm Hg. Peak gradient (S): 67 mm Hg.  ------------------------------------------------------------------- Aorta:  Aortic root: The aortic root was normal in size. Ascending aorta: The ascending aorta was normal in size.  ------------------------------------------------------------------- Mitral valve:   Mildly thickened leaflets .  Doppler:   There was no evidence for stenosis.      Valve area by pressure half-time: 5.64 cm^2. Indexed valve area by pressure half-time: 2.35 cm^2/m^2.   ------------------------------------------------------------------- Left atrium:  The atrium was normal in size.  ------------------------------------------------------------------- Right ventricle:  The cavity size was normal. Systolic function was normal.  ------------------------------------------------------------------- Pulmonic valve:   Poorly visualized.  The valve appears to be grossly normal.    Doppler:  There was no significant regurgitation.  ------------------------------------------------------------------- Tricuspid valve:   The valve appears to be grossly normal. Doppler:  There was trivial regurgitation.  ------------------------------------------------------------------- Right atrium:  The atrium was normal in  size.  ------------------------------------------------------------------- Pericardium:  There was no pericardial effusion.  ------------------------------------------------------------------- Measurements   Left ventricle                            Value          Reference  LV ID, ED, PLAX chordal                   48    mm       43 - 52  LV ID, ES, PLAX chordal                   35    mm       23 - 38  LV fx shortening, PLAX chordal    (L)     27    %        >=29  LV PW thickness, ED                       10    mm       ---------  IVS/LV PW ratio, ED                       1              <=1.3  Stroke volume, 2D                         50    ml       ---------  Stroke volume/bsa, 2D                     21    ml/m^2   ---------  LV e&', lateral  5.25  cm/s     ---------  LV E/e&', lateral                          8.74           ---------  LV e&', medial                             3.98  cm/s     ---------  LV E/e&', medial                           11.53          ---------  LV e&', average                            4.62  cm/s     ---------  LV E/e&', average                          9.95           ---------    Ventricular septum                        Value          Reference  IVS thickness, ED                         10    mm       ---------    LVOT                                      Value          Reference  LVOT ID, S                                20    mm       ---------  LVOT area                                 3.14  cm^2     ---------  LVOT peak velocity, S                     70.5  cm/s     ---------  LVOT mean velocity, S                     46    cm/s     ---------  LVOT VTI, S                               15.9  cm       ---------    Aortic valve                              Value          Reference  Aortic valve peak velocity, S  409   cm/s     ---------  Aortic valve mean velocity, S             307   cm/s     ---------   Aortic valve VTI, S                       110   cm       ---------  Aortic mean gradient, S                   42    mm Hg    ---------  Aortic peak gradient, S                   67    mm Hg    ---------  VTI ratio, LVOT/AV                        0.14           ---------  Aortic valve area, VTI                    0.45  cm^2     ---------  Aortic valve area/bsa, VTI                0.19  cm^2/m^2 ---------  Velocity ratio, peak, LVOT/AV             0.17           ---------  Aortic valve area, peak velocity          0.54  cm^2     ---------  Aortic valve area/bsa, peak               0.23  cm^2/m^2 ---------  velocity  Velocity ratio, mean, LVOT/AV             0.15           ---------  Aortic valve area, mean velocity          0.47  cm^2     ---------  Aortic valve area/bsa, mean               0.2   cm^2/m^2 ---------  velocity    Aorta                                     Value          Reference  Aortic root ID, ED                        35    mm       ---------  Ascending aorta ID, A-P, S                34    mm       ---------    Left atrium                               Value          Reference  LA ID, A-P, ES                            36    mm       ---------  LA ID/bsa, A-P                            1.5   cm/m^2   <=2.2  LA volume, S                              44.1  ml       ---------  LA volume/bsa, S                          18.4  ml/m^2   ---------  LA volume, ES, 1-p A4C                    35.6  ml       ---------  LA volume/bsa, ES, 1-p A4C                14.9  ml/m^2   ---------  LA volume, ES, 1-p A2C                    49.9  ml       ---------  LA volume/bsa, ES, 1-p A2C                20.8  ml/m^2   ---------    Mitral valve                              Value          Reference  Mitral E-wave peak velocity               45.9  cm/s     ---------  Mitral A-wave peak velocity               103   cm/s     ---------  Mitral deceleration time          (L)     134   ms        150 - 230  Mitral pressure half-time                 39    ms       ---------  Mitral E/A ratio, peak                    0.4            ---------  Mitral valve area, PHT, DP                5.64  cm^2     ---------  Mitral valve area/bsa, PHT, DP            2.35  cm^2/m^2 ---------    Systemic veins                            Value          Reference  Estimated CVP                             3     mm Hg    ---------    Right ventricle  Value          Reference  TAPSE                                     19.4  mm       ---------  RV s&', lateral, S                         11.5  cm/s     ---------  Legend: (L)  and  (H)  mark values outside specified reference range.  ------------------------------------------------------------------- Prepared and Electronically Authenticated by  Kristeen Miss, M.D. 2019-12-17T16:47:57   RIGHT/LEFT HEART CATH AND CORONARY ANGIOGRAPHY  Conclusion     Prox LAD lesion is 25% stenosed.  Ost RCA lesion is 40% stenosed.  Acute Mrg lesion is 80% stenosed.  LV end diastolic pressure is normal.  There is severe aortic valve stenosis.   1. Nonobstructive CAD except for 80% RV marginal branch 2. Severe calcific aortic stenosis. Mean gradient 45 mm Hg, peak to peak 66 mm Hg, AV area 0.9 cm squared with index 0.4 3. Normal right heart and LV filling pressures 4. Normal cardiac output.  Plan: will refer to our heart valve team for consideration of AVR.    Recommendations   Antiplatelet/Anticoag Recommend Aspirin 81mg  daily for moderate CAD.  Indications   Nonrheumatic aortic valve stenosis [I35.0 (ICD-10-CM)]  Procedural Details   Technical Details Indication: 85 yo WM with history of DM, HTN is seen for pre op clearance for gallbladder surgery. Found to have severe aortic stenosis by Echo with normal LV function  Procedural Details: The right wrist was prepped, draped, and anesthetized with 1% lidocaine. Using the  modified Seldinger technique a 6 Fr slender sheath was placed in the right radial artery and a 5 French sheath was placed in the right brachial vein. A Swan-Ganz catheter was used for the right heart catheterization. Standard protocol was followed for recording of right heart pressures and sampling of oxygen saturations. Fick cardiac output was calculated. Standard Judkins catheters were used for selective coronary angiography and left ventricular pressures. Manipulation of catheters was somewhat limited by innominate tortuosity. The AV was crossed with a straight wire and LA catheter but could not cross the valve with the catheter. We were able to cross with a MP catheter for pressures.There were no immediate procedural complications. The patient was transferred to the post catheterization recovery area for further monitoring.  Contrast: 80 cc  Estimated blood loss <50 mL.   During this procedure medications were administered to achieve and maintain moderate conscious sedation while the patient's heart rate, blood pressure, and oxygen saturation were continuously monitored and I was present face-to-face 100% of this time.  Medications  (Filter: Administrations occurring from 06/13/18 1034 to 06/13/18 1138)  Medication Rate/Dose/Volume Action  Date Time   fentaNYL (SUBLIMAZE) injection (mcg) 25 mcg Given 06/13/18 1051   Total dose as of 06/29/18 1800        25 mcg        midazolam (VERSED) injection (mg) 1 mg Given 06/13/18 1051   Total dose as of 06/29/18 1800        1 mg        Heparin (Porcine) in NaCl 1000-0.9 UT/500ML-% SOLN (mL) 500 mL Given 06/13/18 1051   Total dose as of 06/29/18 1800 500 mL Given 1051   1,000 mL  lidocaine (PF) (XYLOCAINE) 1 % injection (mL) 5 mL Given 06/13/18 1051   Total dose as of 06/29/18 1800        5 mL        Radial Cocktail/Verapamil only (mL) 10 mL Given 06/13/18 1051   Total dose as of 06/29/18 1800        10 mL        heparin injection (Units)  5,500 Units Given 06/13/18 1101   Total dose as of 06/29/18 1800        5,500 Units        iohexol (OMNIPAQUE) 350 MG/ML injection (mL) 80 mL Given 06/13/18 1132   Total dose as of 06/29/18 1800        80 mL        Sedation Time   Sedation Time Physician-1: 42 minutes 5 seconds  Complications   Complications documented before study signed (06/13/2018 11:39 AM EST)    No complications were associated with this study.  Documented by [place], Peter M, MD - 06/13/2018 11:37 AM EST    Coronary Findings   Diagnostic  Dominance: Right  Left Main  Vessel was injected. Vessel is normal in caliber. The vessel exhibits minimal luminal irregularities. The vessel is moderately calcified.  Left Anterior Descending  Vessel was injected. Vessel is normal in caliber. The vessel is moderately calcified.  Prox LAD lesion 25% stenosed  Prox LAD lesion is 25% stenosed.  Ramus Intermedius  Vessel was injected. Vessel is normal in caliber. Vessel is angiographically normal.  Left Circumflex  Vessel was injected. Vessel is normal in caliber. The vessel exhibits minimal luminal irregularities.  Right Coronary Artery  Ost RCA lesion 40% stenosed  Ost RCA lesion is 40% stenosed.  Acute Marginal Branch  Acute Mrg lesion 80% stenosed  Acute Mrg lesion is 80% stenosed.  Intervention   No interventions have been documented.  Left Heart   Left Ventricle LV end diastolic pressure is normal.  Aortic Valve There is severe aortic valve stenosis. The aortic valve is calcified. There is restricted aortic valve motion.  Coronary Diagrams   Diagnostic  Dominance: Right    Intervention   Implants    No implant documentation for this case.  Syngo Images   Show images for CARDIAC CATHETERIZATION  MERGE Images   Show images for CARDIAC CATHETERIZATION   Link to Procedure Log   Procedure Log    Hemo Data    Most Recent Value  Fick Cardiac Output 6.51 L/min  Fick Cardiac Output Index 2.85  (L/min)/BSA  Aortic Mean Gradient 44.4 mmHg  Aortic Peak Gradient 66 mmHg  Aortic Valve Area 0.92  Aortic Value Area Index 0.4 cm2/BSA  RA A Wave 6 mmHg  RA V Wave 5 mmHg  RA Mean 4 mmHg  RV Systolic Pressure 33 mmHg  RV Diastolic Pressure 0 mmHg  RV EDP 6 mmHg  PA Systolic Pressure 28 mmHg  PA Diastolic Pressure 8 mmHg  PA Mean 18 mmHg  PW A Wave 10 mmHg  PW V Wave 8 mmHg  PW Mean 9 mmHg  AO Systolic Pressure 142 mmHg  AO Diastolic Pressure 67 mmHg  AO Mean 97 mmHg  LV Systolic Pressure 215 mmHg  LV Diastolic Pressure -16 mmHg  LV EDP 15 mmHg  AOp Systolic Pressure 150 mmHg  AOp Diastolic Pressure 71 mmHg  AOp Mean Pressure 105 mmHg  LVp Systolic Pressure 216 mmHg  LVp Diastolic Pressure 0 mmHg  LVp EDP Pressure 14 mmHg  QP/QS 1  TPVR Index 6.31 HRUI  TSVR Index 34.01 HRUI  PVR SVR Ratio 0.1  TPVR/TSVR Ratio 0.19    Cardiac TAVR CT  TECHNIQUE: The patient was scanned on a Siemens Force 192 slice scanner. A 120 kV retrospective scan was triggered in the descending thoracic aorta at 111 HU's. Gantry rotation speed was 270 msecs and collimation was .9 mm. No beta blockade or nitro were given. The 3D data set was reconstructed in 5% intervals of the R-R cycle. Systolic and diastolic phases were analyzed on a dedicated work station using MPR, MIP and VRT modes. The patient received 80 cc of contrast.  FINDINGS: Aortic Valve: Tri leaflet and calcified with restricted leaflet motion  Aorta: Moderate calcific atherosclerosis with bovine arch no aneurysm  Sinotubular Junction: 30 mm  Ascending Thoracic Aorta: 34 mm  Aortic Arch: 28 mm  Descending Thoracic Aorta: 28 mm  Sinus of Valsalva Measurements:  Non-coronary: 31.5 mm  Right - coronary: 30.2 mm  Left - coronary: 31 mm  Coronary Artery Height above Annulus:  Left Main: 15.8 mm above annulus  Right Coronary: 15.6 mm above annulus  Virtual Basal Annulus  Measurements:  Maximum/Minimum Diameter: 27 mm x 23.3 mm  Perimeter: 82 mm  Area: 503 mm2  Coronary Arteries: Sufficient height above annulus for deployment  Optimum Fluoroscopic Angle for Delivery: LAO 11 Caudal 7 degrees  IMPRESSION: 1. Tri leaflet AV with annulus of 503 mm 2 suitable for a 26 mm Sapien 3 valve  2.  Coronary arteries sufficient height above annulus for deployment  3. Optimum angiographic angle for deployment LAO 11 Caudal 7 degrees  4.  No LAA thrombus  5. Bovine arch with normal aortic root diameter moderate calcific aortic atherosclerosis  Charlton Haws   Electronically Signed   By: Charlton Haws M.D.   On: 06/19/2018 15:56        CT ANGIOGRAPHY CHEST, ABDOMEN AND PELVIS  TECHNIQUE: Multidetector CT imaging through the chest, abdomen and pelvis was performed using the standard protocol during bolus administration of intravenous contrast. Multiplanar reconstructed images and MIPs were obtained and reviewed to evaluate the vascular anatomy.  CONTRAST:  ISOVUE-370 IOPAMIDOL (ISOVUE-370) INJECTION 76%  COMPARISON:  None.  FINDINGS: CTA CHEST FINDINGS  Cardiovascular: Heart size is normal. There is no significant pericardial fluid, thickening or pericardial calcification. There is aortic atherosclerosis, as well as atherosclerosis of the great vessels of the mediastinum and the coronary arteries, including calcified atherosclerotic plaque in the left main, left anterior descending, left circumflex and right coronary arteries. Severe calcifications of the aortic valve.  Mediastinum/Lymph Nodes: No pathologically enlarged mediastinal or hilar lymph nodes. Small hiatal hernia. No axillary lymphadenopathy.  Lungs/Pleura: 1.8 cm calcified granuloma in the right lower lobe. Several other smaller calcified granulomas are also noted in the right lung. No other suspicious appearing pulmonary nodules or masses are noted.  No acute consolidative airspace disease. No pleural effusions.  Musculoskeletal/Soft Tissues: There are no aggressive appearing lytic or blastic lesions noted in the visualized portions of the skeleton.  CTA ABDOMEN AND PELVIS FINDINGS  Hepatobiliary: Liver has a slightly shrunken appearance and nodular contour, indicative of underlying cirrhosis. No discrete cystic or solid hepatic lesions. No intra or extrahepatic biliary ductal dilatation. Small calcified gallstones lying dependently in the neck of the gallbladder. No findings to suggest an acute cholecystitis at this time.  Pancreas: No pancreatic mass. No pancreatic ductal dilatation. No pancreatic or peripancreatic fluid or inflammatory changes.  Spleen: Numerous calcified  granulomas in the spleen.  Adrenals/Urinary Tract: Low-attenuation lesions in the kidneys bilaterally, compatible with simple cysts, largest of which measures 5.2 cm in the interpolar region of the right kidney. Subcentimeter low-attenuation lesion in the lower pole the right kidney, too small to characterize, but statistically likely to represent a tiny cyst. Urothelial enhancement in the right renal pelvis. No hydroureteronephrosis. Urinary bladder is normal in appearance. Bilateral adrenal glands are normal in appearance.  Stomach/Bowel: Normal appearance of the stomach. No pathologic dilatation of small bowel or colon. Numerous colonic diverticulae are noted, with some inflammatory changes adjacent to a diverticulum in the descending colon (axial image 108 of series 15), which could indicate an acute diverticulitis. There is a short segment of the mid transverse colon which extends into an epigastric ventral hernia. The appendix is not confidently identified and may be surgically absent. Regardless, there are no inflammatory changes noted adjacent to the cecum to suggest the presence of an acute appendicitis at this  time.  Vascular/Lymphatic: Aortic atherosclerosis. Postoperative changes of aorto bi-iliac graft placement. Today's study is considered nondiagnostic from a CTA perspective secondary to suboptimal contrast bolus. No lymphadenopathy noted in the abdomen or pelvis.  Reproductive: Prostate gland and seminal vesicles are unremarkable in appearance.  Other: Moderate-sized epigastric ventral hernia containing a short segment of the mid transverse colon. There is also a smaller supraumbilical ventral hernia containing only omental fat immediately above the level of the umbilicus. No significant volume of ascites. No pneumoperitoneum.  Musculoskeletal: There are no aggressive appearing lytic or blastic lesions noted in the visualized portions of the skeleton.  VASCULAR MEASUREMENTS PERTINENT TO TAVR:  AORTA:  Minimal Aortic Diameter-18 x 19 mm  Severity of Aortic Calcification-moderate  Nondiagnostic assessment of pelvic vasculature secondary to suboptimal contrast bolus.  IMPRESSION: 1. Today's study is unfortunately nondiagnostic in terms of CTA assessment of the pelvic vasculature for upcoming TAVR procedure. Repeat CTA of the abdomen and pelvis is recommended prior to the procedure for full assessment of the pelvic arterial access vessels. 2. Severe calcifications of the aortic valve, compatible with the reported clinical history of severe aortic stenosis. 3. Colonic diverticulosis with inflammatory changes adjacent to the descending colon, concerning for an acute diverticulitis. Further clinical evaluation is recommended. 4. Cholelithiasis without evidence of acute cholecystitis at this time. 5. Two ventral hernias, one of which in epigastric region contains a short segment of the mid transverse colon. No findings to suggest acute bowel incarceration or obstruction at this time. 6. Aortic atherosclerosis, in addition to left main and 3 vessel coronary artery  disease. 7. Additional incidental findings, as above.  These results will be called to the ordering clinician or representative by the Radiologist Assistant, and communication documented in the PACS or zVision Dashboard.   Electronically Signed   By: Trudie Reed M.D.   On: 06/19/2018 14:51   CT ANGIOGRAPHY CHEST, ABDOMEN AND PELVIS  TECHNIQUE: Multidetector CT imaging through the chest, abdomen and pelvis was performed using the standard protocol during bolus administration of intravenous contrast. Multiplanar reconstructed images and MIPs were obtained and reviewed to evaluate the vascular anatomy.  CONTRAST:  ISOVUE-370 IOPAMIDOL (ISOVUE-370) INJECTION 76%  COMPARISON:  CTA of the chest, abdomen and pelvis 06/19/2018.  FINDINGS: CTA CHEST FINDINGS  Cardiovascular: Heart size is normal. There is no significant pericardial fluid, thickening or pericardial calcification. There is aortic atherosclerosis, as well as atherosclerosis of the great vessels of the mediastinum and the coronary arteries, including calcified atherosclerotic plaque in the  left main, left anterior descending, left circumflex and right coronary arteries. Severe thickening calcification of the aortic valve.  Mediastinum/Lymph Nodes: No pathologically enlarged mediastinal or hilar lymph nodes. Small hiatal hernia. No axillary lymphadenopathy.  Lungs/Pleura: Calcified granulomas are again noted throughout the right lung, largest of which measures up to 1.8 cm in the right lower lobe. No other suspicious appearing pulmonary nodules or masses are noted. No acute consolidative airspace disease. No pleural effusions.  Musculoskeletal/Soft Tissues: There are no aggressive appearing lytic or blastic lesions noted in the visualized portions of the skeleton.  CTA ABDOMEN AND PELVIS FINDINGS  Hepatobiliary: Liver again has a shrunken appearance and nodular contour, compatible with  underlying cirrhosis. No suspicious cystic or solid hepatic lesions. No intra or extrahepatic biliary ductal dilatation. Tiny calcified gallstones lie dependently in the gallbladder. No surrounding inflammatory changes to suggest an acute cholecystitis at this time.  Pancreas: No pancreatic mass. No pancreatic ductal dilatation. No pancreatic or peripancreatic fluid or inflammatory changes.  Spleen: Unremarkable.  Adrenals/Urinary Tract: Atrophy of both kidneys. Low-attenuation lesions in both kidneys, compatible with simple cysts, largest of which measures up to 5.5 cm in diameter in the interpolar region of the right kidney. No hydroureteronephrosis. Urinary bladder is nearly decompressed, but otherwise unremarkable in appearance. Bilateral adrenal glands are normal in appearance.  Stomach/Bowel: Normal appearance of the stomach. No pathologic dilatation of small bowel or colon. Short segment of mid transverse colon extends into an epigastric ventral hernia. A few scattered colonic diverticulae are noted, without surrounding inflammatory changes to suggest an acute diverticulitis at this time. Specifically, the inflammation noted adjacent to the proximal descending colon seen on the prior study has resolved. The appendix is not confidently identified and may be surgically absent. Regardless, there are no inflammatory changes noted adjacent to the cecum to suggest the presence of an acute appendicitis at this time.  Vascular/Lymphatic: Aortic atherosclerosis with postoperative changes of aorto bi-iliac bypass graft which is widely patent. Vascular findings and measurements pertinent to potential TAVR procedure, as detailed below. 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 No lymphadenopathy noted in the abdomen or pelvis.  Reproductive: Prostate gland  and seminal vesicles are unremarkable in appearance.  Other: Small epigastric ventral hernia containing a short segment of the mid transverse colon. There is also a small infraumbilical ventral hernia containing some omental fat and short segment of mid small bowel. No significant volume of ascites. No pneumoperitoneum.  Musculoskeletal: There are no aggressive appearing lytic or blastic lesions noted in the visualized portions of the skeleton.  VASCULAR MEASUREMENTS PERTINENT TO TAVR:  AORTA:  Minimal Aortic Diameter-19 x 18 mm  Severity of Aortic Calcification-mild-to-moderate  RIGHT PELVIS:  Right Common Iliac Artery -  Minimal Diameter-11.6 x 10.3 mm  Tortuosity-mild  Calcification-moderate  Right External Iliac Artery -  Minimal Diameter-4.0 x 3.3 mm  Tortuosity-moderate to severe  Calcification-moderate  Right Common Femoral Artery -  Minimal Diameter-8.1 x 7.0 mm  Tortuosity-mild  Calcification-mild-to-moderate  LEFT PELVIS:  Left Common Iliac Artery -  Minimal Diameter-9.8 x 8.8 mm  Tortuosity-mild  Calcification-mild  Left External Iliac Artery -  Minimal Diameter-5.7 x 5.7 mm  Tortuosity-moderate to severe  Calcification-moderate  Left Common Femoral Artery -  Minimal Diameter-6.8 x 6.8 mm  Tortuosity-mild  Calcification-moderate  Comment- aneurysmal dilatation up to 16 x 19 mm with large eccentric atheromatous plaque.  Review  of the MIP images confirms the above findings.  IMPRESSION: 1. Vascular findings and measurements pertinent to potential TAVR procedure, as detailed above. 2. Severe thickening calcification of the aortic valve, compatible with the reported clinical history of severe aortic stenosis. 3. Resolution of inflammatory changes noted adjacent to the proximal descending colon seen on the prior study, suggesting a resolved diverticulitis. Extensive colonic diverticulosis is  again noted. 4. Epigastric ventral hernia containing short segment of mid transverse colon, and infraumbilical ventral hernia containing some omental fat and short segment of mid small bowel without evidence of bowel incarceration or obstruction at this time. 5. Cirrhosis. 6. Cholelithiasis without evidence of acute cholecystitis at this time. 7. Additional incidental findings, as above.   Electronically Signed   By: Trudie Reed M.D.   On: 06/28/2018 09:02   Impression:  Patient has stage D severe symptomatic aortic stenosis.  He describes stable symptoms of exertional shortness of breath consistent with chronic diastolic congestive heart failure, [place] Heart Association functional class IIb-III.  He also describes symptoms of chronic upper abdominal pain of unclear etiology.  He has known history of cholelithiasis with abnormal gallbladder ejection fraction on nuclear imaging.  In addition, he has a large ventral incisional hernia which could be symptomatic.  Finally, the patient has severe diverticulosis, and recent CT scan performed less than 2 weeks ago revealed some slight inflammation around the colon.  Follow-up CT scan performed this week did not reveal any such inflammation or other signs of ongoing infectious process or inflammatory process.  Specifically, there are no current radiographic findings suggestive of either cholecystitis or diverticulitis.  I have personally reviewed the patient's recent transthoracic echocardiogram, diagnostic cardiac catheterization, and CT angiograms.  Echocardiogram reveals normal left ventricular systolic function with severe aortic stenosis.  Peak velocity across aortic valve measured 4.1 m/s corresponding to mean transvalvular gradient estimated greater than 40 mmHg.  These findings were corroborated on recent diagnostic cardiac catheterization with a mean transvalvular gradient was directly measured 45 mmHg.  The patient was found to have  mild nonobstructive coronary artery disease with exception of 80% stenosis of a medium-sized RV marginal branch.  Right heart pressures were normal.    I agree the patient would benefit from aortic valve replacement.  I would not consider this patient a candidate for conventional surgery given his advanced age, extremely limited functional status and mobility, numerous comorbid medical problems, and possible ongoing intra-abdominal pathology.  Cardiac-gated CTA of the heart reveals anatomical characteristics consistent with aortic stenosis suitable for treatment by transcatheter aortic valve replacement without any significant complicating features.  CTA of the aorta and iliac vessels demonstrates significant peripheral vascular disease with previous aortobiiliac grafting.  There may be adequate pelvic vascular access to facilitate a transfemoral approach on the right side, although vascular access is clearly borderline.  Alternative access might be necessary.  Either left subclavian or left carotid approach could be utilized.   Plan:  The patient and his son were counseled at length regarding treatment alternatives for management of severe symptomatic aortic stenosis. Alternative approaches such as conventional aortic valve replacement, transcatheter aortic valve replacement, and continued medical therapy without intervention were compared and contrasted at length.  The risks associated with conventional surgical aortic valve replacement were discussed in detail, as were expectations for post-operative convalescence, and why I would be reluctant to consider this patient a candidate for conventional surgery under any circumstances.  Issues specific to transcatheter aortic valve replacement were discussed including questions about  long term valve durability, the potential for paravalvular leak, possible increased risk of need for permanent pacemaker placement, and other technical complications related to the  procedure itself.  Concerns related to the patient's possible ongoing abdominal pathology were discussed.  Concerns related to the patient's marginal vascular access were discussed including increased risk of vascular complication and/or possible need for alternative approach.  Long-term prognosis with medical therapy was discussed. This discussion was placed in the context of the patient's own specific clinical presentation and past medical history.  All of their questions have been addressed.  The patient desires to proceed with transcatheter aortic valve replacement as soon as practical.  We plan to proceed with transcatheter aortic valve replacement on Tuesday, July 04, 2018.  At the time of surgery we will evaluate the possibility of transfemoral access from the right side, but we will be ready to proceed with alternative access from the left subclavian approach if necessary.  Following the decision to proceed with transcatheter aortic valve replacement, a discussion has been held regarding what types of management strategies would be attempted intraoperatively in the event of life-threatening complications, including whether or not the patient would be considered a candidate for the use of cardiopulmonary bypass and/or conversion to open sternotomy for attempted surgical intervention.  The patient has been advised of a variety of complications that might develop including but not limited to risks of death, stroke, paravalvular leak, aortic dissection or other major vascular complications, aortic annulus rupture, device embolization, cardiac rupture or perforation, mitral regurgitation, acute myocardial infarction, arrhythmia, heart block or bradycardia requiring permanent pacemaker placement, congestive heart failure, respiratory failure, renal failure, pneumonia, infection, other late complications related to structural valve deterioration or migration, or other complications that might ultimately cause  a temporary or permanent loss of functional independence or other long term morbidity.  The patient provides full informed consent for the procedure as described and all questions were answered.   I spent in excess of 90 minutes during the conduct of this office consultation and >50% of this time involved direct face-to-face encounter with the patient for counseling and/or coordination of their care.   Salvatore Decent. Cornelius Moras, MD 06/29/2018 4:11 PM

## 2018-06-29 NOTE — Patient Instructions (Addendum)
   Continue taking all current medications without change through the day before surgery.  Have nothing to eat or drink after midnight the night before surgery.  On the morning of surgery take only Protonix with a sip of water.  Check your blood sugar.  You may take 1/2 of your regular dose of Levemir insulin if your blood sugar is >120

## 2018-06-30 ENCOUNTER — Other Ambulatory Visit: Payer: Self-pay | Admitting: Physician Assistant

## 2018-06-30 NOTE — Pre-Procedure Instructions (Signed)
[name] [name]  06/30/2018      [facility] [place] - [address] [place] [address] Phone: [phone] Fax: [phone]    Your procedure is scheduled on Tuesday January 21st.  [facility]  Call this number if you have problems the morning of surgery:  [phone]   Remember:  Do not eat or drink after midnight.    Take these medicines the morning of surgery with A SIP OF WATER  None  Follow your surgeon's instructions on when to stop Asprin.  If no instructions were given by your surgeon then you will need to call the office to get those instructions.    7 days prior to surgery STOP taking any Aspirin (unless otherwise instructed by your surgeon), Aleve, Naproxen, Ibuprofen, Motrin, Advil, Goody's, BC's, all herbal medications, fish oil, and all vitamins.   WHAT DO I DO ABOUT MY DIABETES MEDICATION?   Marland Kitchen. Do not take oral diabetes medicines (pills) the morning of surgery: sitaGLIPtin (JANUVIA) and pioglitazone (ACTOS).  . THE MORNING OF SURGERY, take 32 units of insulin detemir (LEVEMIR) insulin.  . The day of surgery, do not take other diabetes injectables, including Byetta (exenatide), Bydureon (exenatide ER), Victoza (liraglutide), or Trulicity (dulaglutide).  . If your CBG is greater than 220 mg/dL, you may take  of your sliding scale (correction) dose of insulin.   How to Manage Your Diabetes Before and After Surgery  Why is it important to control my blood sugar before and after surgery? . Improving blood sugar levels before and after surgery helps healing and can limit problems. . A way of improving blood sugar control is eating a healthy diet by: o  Eating less sugar and carbohydrates o  Increasing activity/exercise o  Talking with your doctor about reaching your blood sugar goals . High blood sugars (greater than 180 mg/dL) can raise your risk of infections and  slow your recovery, so you will need to focus on controlling your diabetes during the weeks before surgery. . Make sure that the doctor who takes care of your diabetes knows about your planned surgery including the date and location.  How do I manage my blood sugar before surgery? . Check your blood sugar at least 4 times a day, starting 2 days before surgery, to make sure that the level is not too high or low. o Check your blood sugar the morning of your surgery when you wake up and every 2 hours until you get to the Short Stay unit. . If your blood sugar is less than 70 mg/dL, you will need to treat for low blood sugar: o Do not take insulin. o Treat a low blood sugar (less than 70 mg/dL) with  cup of clear juice (cranberry or apple), 4 glucose tablets, OR glucose gel. o Recheck blood sugar in 15 minutes after treatment (to make sure it is greater than 70 mg/dL). If your blood sugar is not greater than 70 mg/dL on recheck, call [phone] for further instructions. . Report your blood sugar to the short stay nurse when you get to Short Stay.  . If you are admitted to the hospital after surgery: o Your blood sugar will be checked by the staff and you will probably be given insulin after surgery (instead of oral diabetes medicines) to make sure you have good blood sugar levels. o The goal for blood sugar control after surgery  is 80-180 mg/dL.     Do not wear jewelry.  Do not wear lotions, powders, or colognes, or deodorant.  Men may shave face and neck.  Do not bring valuables to the hospital.  [facility] is not responsible for any belongings or valuables.  Contacts, dentures or bridgework may not be worn into surgery.  Leave your suitcase in the car.  After surgery it may be brought to your room.  For patients admitted to the hospital, discharge time will be determined by your treatment team.  Patients discharged the day of surgery will not be allowed to drive home.    [facility]-  Preparing For Surgery  Before surgery, you can play an important role. Because skin is not sterile, your skin needs to be as free of germs as possible. You can reduce the number of germs on your skin by washing with CHG (chlorahexidine gluconate) Soap before surgery.  CHG is an antiseptic cleaner which kills germs and bonds with the skin to continue killing germs even after washing.    Oral Hygiene is also important to reduce your risk of infection.  Remember - BRUSH YOUR TEETH THE MORNING OF SURGERY WITH YOUR REGULAR TOOTHPASTE  Please do not use if you have an allergy to CHG or antibacterial soaps. If your skin becomes reddened/irritated stop using the CHG.  Do not shave (including legs and underarms) for at least 48 hours prior to first CHG shower. It is OK to shave your face.  Please follow these instructions carefully.   1. Shower the NIGHT BEFORE SURGERY and the MORNING OF SURGERY with CHG.   2. If you chose to wash your hair, wash your hair first as usual with your normal shampoo.  3. After you shampoo, rinse your hair and body thoroughly to remove the shampoo.  4. Use CHG as you would any other liquid soap. You can apply CHG directly to the skin and wash gently with a scrungie or a clean washcloth.   5. Apply the CHG Soap to your body ONLY FROM THE NECK DOWN.  Do not use on open wounds or open sores. Avoid contact with your eyes, ears, mouth and genitals (private parts). Wash Face and genitals (private parts)  with your normal soap.  6. Wash thoroughly, paying special attention to the area where your surgery will be performed.  7. Thoroughly rinse your body with warm water from the neck down.  8. DO NOT shower/wash with your normal soap after using and rinsing off the CHG Soap.  9. Pat yourself dry with a CLEAN TOWEL.  10. Wear CLEAN PAJAMAS to bed the night before surgery, wear comfortable clothes the morning of surgery  11. Place CLEAN SHEETS on your bed the night of your  first shower and DO NOT SLEEP WITH PETS.    Day of Surgery:  Do not apply any deodorants/lotions.  Please wear clean clothes to the hospital/surgery center.   Remember to brush your teeth WITH YOUR REGULAR TOOTHPASTE.    Please read over the following fact sheets that you were given.

## 2018-06-30 NOTE — Pre-Procedure Instructions (Signed)
[name] [name]  06/30/2018      [facility] - [place], [place] - [name] [place] [address] Phone: [phone] Fax: [phone]    Your procedure is scheduled on Tuesday January 21st.   Report to [facility] Admitting at 0800 A.M.   Call this number if you have problems the morning of surgery:  [phone]   Remember:  Do not eat or drink after midnight.    Take these medicines the morning of surgery with A SIP OF WATER  None     7 days prior to surgery STOP taking  Aleve, Naproxen, Ibuprofen, Motrin, Advil, Goody's, BC's, all herbal medications, fish oil, and all vitamins.   WHAT DO I DO ABOUT MY DIABETES MEDICATION?   Marland Kitchen Do not take oral diabetes medicines (pills) the morning of surgery: sitaGLIPtin (JANUVIA) and pioglitazone (ACTOS).  . THE MORNING OF SURGERY, take 17 units of insulin detemir (LEVEMIR) insulin  If  Your blood sugar is greater than 120.     How to Manage Your Diabetes Before and After Surgery  Why is it important to control my blood sugar before and after surgery? . Improving blood sugar levels before and after surgery helps healing and can limit problems. . A way of improving blood sugar control is eating a healthy diet by: o  Eating less sugar and carbohydrates o  Increasing activity/exercise o  Talking with your doctor about reaching your blood sugar goals . High blood sugars (greater than 180 mg/dL) can raise your risk of infections and slow your recovery, so you will need to focus on controlling your diabetes during the weeks before surgery. . Make sure that the doctor who takes care of your diabetes knows about your planned surgery including the date and location.  How do I manage my blood sugar before surgery? . Check your blood sugar at least 4 times a day, starting 2 days before surgery, to make sure that the level is not too high or low. o Check your blood sugar the morning of  your surgery when you wake up and every 2 hours until you get to the Short Stay unit. . If your blood sugar is less than 70 mg/dL, you will need to treat for low blood sugar: o Do not take insulin. o Treat a low blood sugar (less than 70 mg/dL) with  cup of clear juice (cranberry or apple), 4 glucose tablets, OR glucose gel. o Recheck blood sugar in 15 minutes after treatment (to make sure it is greater than 70 mg/dL). If your blood sugar is not greater than 70 mg/dL on recheck, call [phone] for further instructions. . Report your blood sugar to the short stay nurse when you get to Short Stay.  . If you are admitted to the hospital after surgery: o Your blood sugar will be checked by the staff and you will probably be given insulin after surgery (instead of oral diabetes medicines) to make sure you have good blood sugar levels. o The goal for blood sugar control after surgery is 80-180 mg/dL.     Do not wear jewelry.  Do not wear lotions, powders, or colognes, or deodorant.  Men may shave face and neck.  Do not bring valuables to the hospital.  [facility] is not responsible for any belongings or valuables.  Contacts, dentures or bridgework may not be worn into surgery.  Leave your suitcase in the car.  After surgery  it may be brought to your room.  For patients admitted to the hospital, discharge time will be determined by your treatment team.  Patients discharged the day of surgery will not be allowed to drive home.    [facility]- Preparing For Surgery  Before surgery, you can play an important role. Because skin is not sterile, your skin needs to be as free of germs as possible. You can reduce the number of germs on your skin by washing with CHG (chlorahexidine gluconate) Soap before surgery.  CHG is an antiseptic cleaner which kills germs and bonds with the skin to continue killing germs even after washing.    Oral Hygiene is also important to reduce your risk of infection.   Remember - BRUSH YOUR TEETH THE MORNING OF SURGERY WITH YOUR REGULAR TOOTHPASTE  Please do not use if you have an allergy to CHG or antibacterial soaps. If your skin becomes reddened/irritated stop using the CHG.  Do not shave (including legs and underarms) for at least 48 hours prior to first CHG shower. It is OK to shave your face.  Please follow these instructions carefully.   1. Shower the NIGHT BEFORE SURGERY and the MORNING OF SURGERY with CHG.   2. If you chose to wash your hair, wash your hair first as usual with your normal shampoo.  3. After you shampoo, rinse your hair and body thoroughly to remove the shampoo.  4. Use CHG as you would any other liquid soap. You can apply CHG directly to the skin and wash gently with a scrungie or a clean washcloth.   5. Apply the CHG Soap to your body ONLY FROM THE NECK DOWN.  Do not use on open wounds or open sores. Avoid contact with your eyes, ears, mouth and genitals (private parts). Wash Face and genitals (private parts)  with your normal soap.  6. Wash thoroughly, paying special attention to the area where your surgery will be performed.  7. Thoroughly rinse your body with warm water from the neck down.  8. DO NOT shower/wash with your normal soap after using and rinsing off the CHG Soap.  9. Pat yourself dry with a CLEAN TOWEL.  10. Wear CLEAN PAJAMAS to bed the night before surgery, wear comfortable clothes the morning of surgery  11. Place CLEAN SHEETS on your bed the night of your first shower and DO NOT SLEEP WITH PETS.    Day of Surgery:  Do not apply any deodorants/lotions.  Please wear clean clothes to the hospital/surgery center.   Remember to brush your teeth WITH YOUR REGULAR TOOTHPASTE.    Please read over the following fact sheets that you were given.

## 2018-07-03 ENCOUNTER — Ambulatory Visit (HOSPITAL_COMMUNITY)
Admission: RE | Admit: 2018-07-03 | Discharge: 2018-07-03 | Disposition: A | Discharge status: Home / Self Care | Payer: Medicare Other | Source: Ambulatory Visit | Attending: Physician Assistant | Admitting: Physician Assistant

## 2018-07-03 ENCOUNTER — Encounter (HOSPITAL_COMMUNITY): Payer: Self-pay

## 2018-07-03 ENCOUNTER — Encounter (HOSPITAL_COMMUNITY)
Admission: RE | Admit: 2018-07-03 | Discharge: 2018-07-03 | Disposition: A | Discharge status: Home / Self Care | Payer: Medicare Other | Source: Ambulatory Visit | Attending: Cardiovascular Disease | Admitting: Cardiovascular Disease

## 2018-07-03 DIAGNOSIS — Z01811 Encounter for preprocedural respiratory examination: Secondary | ICD-10-CM

## 2018-07-03 HISTORY — DX: Sleep apnea, unspecified: G47.30

## 2018-07-03 LAB — CBC
HCT: 38.4 % — ABNORMAL LOW (ref 39.0–52.0)
HEMOGLOBIN: 11.7 g/dL — AB (ref 13.0–17.0)
MCH: 32.3 pg (ref 26.0–34.0)
MCHC: 30.5 g/dL (ref 30.0–36.0)
MCV: 106.1 fL — ABNORMAL HIGH (ref 80.0–100.0)
Platelets: 94 10*3/uL — ABNORMAL LOW (ref 150–400)
RBC: 3.62 MIL/uL — ABNORMAL LOW (ref 4.22–5.81)
RDW: 14.8 % (ref 11.5–15.5)
WBC: 5.4 10*3/uL (ref 4.0–10.5)
nRBC: 0 % (ref 0.0–0.2)

## 2018-07-03 LAB — COMPREHENSIVE METABOLIC PANEL
ALT: 13 U/L (ref 0–44)
AST: 16 U/L (ref 15–41)
Albumin: 3.7 g/dL (ref 3.5–5.0)
Alkaline Phosphatase: 74 U/L (ref 38–126)
Anion gap: 11 (ref 5–15)
BUN: 32 mg/dL — ABNORMAL HIGH (ref 8–23)
CO2: 16 mmol/L — AB (ref 22–32)
Calcium: 8.9 mg/dL (ref 8.9–10.3)
Chloride: 113 mmol/L — ABNORMAL HIGH (ref 98–111)
Creatinine, Ser: 1.63 mg/dL — ABNORMAL HIGH (ref 0.61–1.24)
GFR calc non Af Amer: 38 mL/min — ABNORMAL LOW (ref >60)
GFR, EST AFRICAN AMERICAN: 44 mL/min — AB (ref >60)
Glucose, Bld: 105 mg/dL — ABNORMAL HIGH (ref 70–99)
Potassium: 4.4 mmol/L (ref 3.5–5.1)
Sodium: 140 mmol/L (ref 135–145)
Total Bilirubin: 1.1 mg/dL (ref 0.3–1.2)
Total Protein: 6.6 g/dL (ref 6.5–8.1)

## 2018-07-03 LAB — GLUCOSE, CAPILLARY: Glucose-Capillary: 96 mg/dL (ref 70–99)

## 2018-07-03 LAB — BRAIN NATRIURETIC PEPTIDE: B Natriuretic Peptide: 102.7 pg/mL — ABNORMAL HIGH (ref 0.0–100.0)

## 2018-07-03 LAB — BLOOD GAS, ARTERIAL
ACID-BASE DEFICIT: 4 mmol/L — AB (ref 0.0–2.0)
Bicarbonate: 19.9 mmol/L — ABNORMAL LOW (ref 20.0–28.0)
Drawn by: 449841
FIO2: 21
O2 SAT: 98.5 %
PCO2 ART: 32.5 mmHg (ref 32.0–48.0)
PH ART: 7.404 (ref 7.350–7.450)
Patient temperature: 98.6
pO2, Arterial: 119 mmHg — ABNORMAL HIGH (ref 83.0–108.0)

## 2018-07-03 LAB — HEMOGLOBIN A1C
Hgb A1c MFr Bld: 6 % — ABNORMAL HIGH (ref 4.8–5.6)
MEAN PLASMA GLUCOSE: 125.5 mg/dL

## 2018-07-03 LAB — PROTIME-INR
INR: 1.12
PROTHROMBIN TIME: 14.3 s (ref 11.4–15.2)

## 2018-07-03 LAB — ABO/RH: ABO/RH(D): A POS

## 2018-07-03 LAB — SURGICAL PCR SCREEN
MRSA, PCR: NEGATIVE
Staphylococcus aureus: POSITIVE — AB

## 2018-07-03 LAB — APTT: aPTT: 32 seconds (ref 24–36)

## 2018-07-03 MED ORDER — EPINEPHRINE PF 1 MG/ML IJ SOLN
0.0000 ug/min | INTRAVENOUS | Status: DC
  Filled 2018-07-03: qty 4

## 2018-07-03 MED ORDER — SODIUM CHLORIDE 0.9 % IV SOLN
INTRAVENOUS | Status: DC
  Filled 2018-07-03: qty 30

## 2018-07-03 MED ORDER — POTASSIUM CHLORIDE 2 MEQ/ML IV SOLN
80.0000 meq | INTRAVENOUS | Status: DC
  Filled 2018-07-03: qty 40

## 2018-07-03 MED ORDER — NOREPINEPHRINE-SODIUM CHLORIDE 4-0.9 MG/250ML-% IV SOLN
0.0000 ug/min | INTRAVENOUS | Status: DC
  Filled 2018-07-03 (×2): qty 250

## 2018-07-03 MED ORDER — DEXMEDETOMIDINE HCL IN NACL 400 MCG/100ML IV SOLN
0.1000 ug/kg/h | INTRAVENOUS | Status: AC
  Administered 2018-07-04: 1 ug/kg/h via INTRAVENOUS
  Filled 2018-07-03: qty 100

## 2018-07-03 MED ORDER — DOPAMINE-DEXTROSE 3.2-5 MG/ML-% IV SOLN
0.0000 ug/kg/min | INTRAVENOUS | Status: DC
  Filled 2018-07-03: qty 250

## 2018-07-03 MED ORDER — SODIUM CHLORIDE 0.9 % IV SOLN
1.5000 g | INTRAVENOUS | Status: AC
  Administered 2018-07-04: 1.5 g via INTRAVENOUS
  Filled 2018-07-03: qty 1.5

## 2018-07-03 MED ORDER — NITROGLYCERIN IN D5W 200-5 MCG/ML-% IV SOLN
2.0000 ug/min | INTRAVENOUS | Status: DC
  Filled 2018-07-03: qty 250

## 2018-07-03 MED ORDER — MAGNESIUM SULFATE 50 % IJ SOLN
40.0000 meq | INTRAMUSCULAR | Status: DC
  Filled 2018-07-03: qty 9.85

## 2018-07-03 MED ORDER — INSULIN REGULAR(HUMAN) IN NACL 100-0.9 UT/100ML-% IV SOLN
INTRAVENOUS | Status: DC
  Filled 2018-07-03: qty 100

## 2018-07-03 MED ORDER — VANCOMYCIN HCL 10 G IV SOLR
1500.0000 mg | INTRAVENOUS | Status: DC
  Filled 2018-07-03: qty 1500

## 2018-07-03 MED ORDER — NOREPINEPHRINE BITARTRATE 1 MG/ML IV SOLN
0.0000 ug/min | INTRAVENOUS | Status: DC
  Filled 2018-07-03: qty 4

## 2018-07-03 MED ORDER — PHENYLEPHRINE HCL-NACL 20-0.9 MG/250ML-% IV SOLN
30.0000 ug/min | INTRAVENOUS | Status: DC
  Filled 2018-07-03: qty 250

## 2018-07-03 NOTE — Progress Notes (Addendum)
PCP -  Dr. Sheryn Bison [facility] Medical Cardiologist - Dr. Gretchen Portela  Chest x-ray - today EKG - today Stress Test - 2014 ECHO - 05/2018 Cardiac Cath - 12/19  Sleep Study - > than 5 yrs. CPAP -   Fasting Blood Sugar - 98 Checks Blood Sugar ___1__ times a day  Pt. Reports he has a "twinge" in his chest once a day ,olny lasting several seconds then goes away. None today. Doesn't take anything for it and has noticed it the past 6 months. [name] Poles, PA notified.   Blood Thinner Instructions: Aspirin Instructions: to continue until the day of surgery  Anesthesia review:   Patient denies shortness of breath, fever, cough and chest pain at PAT appointment   Patient verbalized understanding of instructions that were given to them at the PAT appointment. Patient was also instructed that they will need to review over the PAT instructions again at home before surgery.  Mupirocin prescription called to [facility] .

## 2018-07-04 ENCOUNTER — Inpatient Hospital Stay (HOSPITAL_COMMUNITY)
Admission: RE | Disposition: A | Discharge status: Home / Self Care | Payer: Self-pay | Source: Home / Self Care | Attending: Thoracic Surgery (Cardiothoracic Vascular Surgery)

## 2018-07-04 ENCOUNTER — Inpatient Hospital Stay (HOSPITAL_COMMUNITY): Payer: Medicare Other

## 2018-07-04 ENCOUNTER — Encounter (HOSPITAL_COMMUNITY): Payer: Self-pay

## 2018-07-04 ENCOUNTER — Inpatient Hospital Stay (HOSPITAL_COMMUNITY): Payer: Medicare Other | Admitting: Physician Assistant

## 2018-07-04 ENCOUNTER — Other Ambulatory Visit: Payer: Self-pay | Admitting: Physician Assistant

## 2018-07-04 ENCOUNTER — Other Ambulatory Visit: Payer: Self-pay

## 2018-07-04 ENCOUNTER — Inpatient Hospital Stay (HOSPITAL_COMMUNITY)
Admission: RE | Admit: 2018-07-04 | Discharge: 2018-07-07 | DRG: 267 | Disposition: A | Discharge status: Home / Self Care | Payer: Medicare Other | Attending: Thoracic Surgery (Cardiothoracic Vascular Surgery) | Admitting: Thoracic Surgery (Cardiothoracic Vascular Surgery)

## 2018-07-04 DIAGNOSIS — Z87440 Personal history of urinary (tract) infections: Secondary | ICD-10-CM

## 2018-07-04 DIAGNOSIS — K219 Gastro-esophageal reflux disease without esophagitis: Secondary | ICD-10-CM | POA: Diagnosis present

## 2018-07-04 DIAGNOSIS — K7460 Unspecified cirrhosis of liver: Secondary | ICD-10-CM | POA: Diagnosis present

## 2018-07-04 DIAGNOSIS — Z952 Presence of prosthetic heart valve: Secondary | ICD-10-CM

## 2018-07-04 DIAGNOSIS — I10 Essential (primary) hypertension: Secondary | ICD-10-CM | POA: Diagnosis present

## 2018-07-04 DIAGNOSIS — M109 Gout, unspecified: Secondary | ICD-10-CM | POA: Diagnosis present

## 2018-07-04 DIAGNOSIS — E7800 Pure hypercholesterolemia, unspecified: Secondary | ICD-10-CM | POA: Diagnosis present

## 2018-07-04 DIAGNOSIS — N179 Acute kidney failure, unspecified: Secondary | ICD-10-CM | POA: Diagnosis not present

## 2018-07-04 DIAGNOSIS — E119 Type 2 diabetes mellitus without complications: Secondary | ICD-10-CM

## 2018-07-04 DIAGNOSIS — I509 Heart failure, unspecified: Secondary | ICD-10-CM

## 2018-07-04 DIAGNOSIS — E1122 Type 2 diabetes mellitus with diabetic chronic kidney disease: Secondary | ICD-10-CM | POA: Diagnosis present

## 2018-07-04 DIAGNOSIS — D696 Thrombocytopenia, unspecified: Secondary | ICD-10-CM | POA: Diagnosis not present

## 2018-07-04 DIAGNOSIS — N183 Chronic kidney disease, stage 3 unspecified: Secondary | ICD-10-CM | POA: Diagnosis present

## 2018-07-04 DIAGNOSIS — E1151 Type 2 diabetes mellitus with diabetic peripheral angiopathy without gangrene: Secondary | ICD-10-CM | POA: Diagnosis present

## 2018-07-04 DIAGNOSIS — I739 Peripheral vascular disease, unspecified: Secondary | ICD-10-CM | POA: Diagnosis present

## 2018-07-04 DIAGNOSIS — Z79899 Other long term (current) drug therapy: Secondary | ICD-10-CM

## 2018-07-04 DIAGNOSIS — D62 Acute posthemorrhagic anemia: Secondary | ICD-10-CM | POA: Diagnosis not present

## 2018-07-04 DIAGNOSIS — L7632 Postprocedural hematoma of skin and subcutaneous tissue following other procedure: Secondary | ICD-10-CM | POA: Diagnosis not present

## 2018-07-04 DIAGNOSIS — M4800 Spinal stenosis, site unspecified: Secondary | ICD-10-CM | POA: Diagnosis present

## 2018-07-04 DIAGNOSIS — G4733 Obstructive sleep apnea (adult) (pediatric): Secondary | ICD-10-CM | POA: Diagnosis present

## 2018-07-04 DIAGNOSIS — E785 Hyperlipidemia, unspecified: Secondary | ICD-10-CM | POA: Diagnosis present

## 2018-07-04 DIAGNOSIS — G4730 Sleep apnea, unspecified: Secondary | ICD-10-CM | POA: Diagnosis present

## 2018-07-04 DIAGNOSIS — I350 Nonrheumatic aortic (valve) stenosis: Secondary | ICD-10-CM

## 2018-07-04 DIAGNOSIS — Z794 Long term (current) use of insulin: Secondary | ICD-10-CM

## 2018-07-04 DIAGNOSIS — Z8249 Family history of ischemic heart disease and other diseases of the circulatory system: Secondary | ICD-10-CM

## 2018-07-04 DIAGNOSIS — Z820 Family history of epilepsy and other diseases of the nervous system: Secondary | ICD-10-CM

## 2018-07-04 DIAGNOSIS — Z8679 Personal history of other diseases of the circulatory system: Secondary | ICD-10-CM

## 2018-07-04 DIAGNOSIS — K8020 Calculus of gallbladder without cholecystitis without obstruction: Secondary | ICD-10-CM | POA: Diagnosis present

## 2018-07-04 DIAGNOSIS — I2510 Atherosclerotic heart disease of native coronary artery without angina pectoris: Secondary | ICD-10-CM | POA: Diagnosis present

## 2018-07-04 DIAGNOSIS — K432 Incisional hernia without obstruction or gangrene: Secondary | ICD-10-CM | POA: Diagnosis present

## 2018-07-04 DIAGNOSIS — T508X5A Adverse effect of diagnostic agents, initial encounter: Secondary | ICD-10-CM | POA: Diagnosis not present

## 2018-07-04 DIAGNOSIS — I129 Hypertensive chronic kidney disease with stage 1 through stage 4 chronic kidney disease, or unspecified chronic kidney disease: Secondary | ICD-10-CM | POA: Diagnosis present

## 2018-07-04 DIAGNOSIS — Z96653 Presence of artificial knee joint, bilateral: Secondary | ICD-10-CM | POA: Diagnosis present

## 2018-07-04 DIAGNOSIS — I700 Atherosclerosis of aorta: Secondary | ICD-10-CM | POA: Diagnosis present

## 2018-07-04 DIAGNOSIS — I447 Left bundle-branch block, unspecified: Secondary | ICD-10-CM | POA: Diagnosis not present

## 2018-07-04 DIAGNOSIS — Z006 Encounter for examination for normal comparison and control in clinical research program: Secondary | ICD-10-CM

## 2018-07-04 DIAGNOSIS — S301XXA Contusion of abdominal wall, initial encounter: Secondary | ICD-10-CM

## 2018-07-04 DIAGNOSIS — I440 Atrioventricular block, first degree: Secondary | ICD-10-CM | POA: Diagnosis not present

## 2018-07-04 DIAGNOSIS — I351 Nonrheumatic aortic (valve) insufficiency: Secondary | ICD-10-CM | POA: Diagnosis not present

## 2018-07-04 DIAGNOSIS — Z954 Presence of other heart-valve replacement: Secondary | ICD-10-CM | POA: Diagnosis not present

## 2018-07-04 DIAGNOSIS — Z7982 Long term (current) use of aspirin: Secondary | ICD-10-CM

## 2018-07-04 HISTORY — DX: Contusion of abdominal wall, initial encounter: S30.1XXA

## 2018-07-04 HISTORY — PX: TRANSCATHETER AORTIC VALVE REPLACEMENT, TRANSFEMORAL: SHX6400

## 2018-07-04 HISTORY — PX: TEE WITHOUT CARDIOVERSION: SHX5443

## 2018-07-04 HISTORY — DX: Chronic kidney disease, stage 3 unspecified: N18.30

## 2018-07-04 HISTORY — PX: INTRAOPERATIVE TRANSTHORACIC ECHOCARDIOGRAM: SHX6523

## 2018-07-04 HISTORY — DX: Presence of prosthetic heart valve: Z95.2

## 2018-07-04 HISTORY — DX: Chronic kidney disease, stage 3 (moderate): N18.3

## 2018-07-04 HISTORY — DX: Calculus of gallbladder without cholecystitis without obstruction: K80.20

## 2018-07-04 LAB — POCT I-STAT, CHEM 8
BUN: 27 mg/dL — ABNORMAL HIGH (ref 8–23)
CALCIUM ION: 1.26 mmol/L (ref 1.15–1.40)
CREATININE: 1.5 mg/dL — AB (ref 0.61–1.24)
Chloride: 113 mmol/L — ABNORMAL HIGH (ref 98–111)
Glucose, Bld: 147 mg/dL — ABNORMAL HIGH (ref 70–99)
HCT: 32 % — ABNORMAL LOW (ref 39.0–52.0)
Hemoglobin: 10.9 g/dL — ABNORMAL LOW (ref 13.0–17.0)
Potassium: 4.5 mmol/L (ref 3.5–5.1)
Sodium: 143 mmol/L (ref 135–145)
TCO2: 19 mmol/L — ABNORMAL LOW (ref 22–32)

## 2018-07-04 LAB — URINALYSIS, ROUTINE W REFLEX MICROSCOPIC
Bilirubin Urine: NEGATIVE
Glucose, UA: NEGATIVE mg/dL
KETONES UR: NEGATIVE mg/dL
Leukocytes, UA: NEGATIVE
Nitrite: NEGATIVE
PH: 5 (ref 5.0–8.0)
Protein, ur: 100 mg/dL — AB
Specific Gravity, Urine: 1.015 (ref 1.005–1.030)

## 2018-07-04 LAB — POCT I-STAT 4, (NA,K, GLUC, HGB,HCT)
GLUCOSE: 128 mg/dL — AB (ref 70–99)
Glucose, Bld: 145 mg/dL — ABNORMAL HIGH (ref 70–99)
HCT: 31 % — ABNORMAL LOW (ref 39.0–52.0)
HCT: 31 % — ABNORMAL LOW (ref 39.0–52.0)
Hemoglobin: 10.5 g/dL — ABNORMAL LOW (ref 13.0–17.0)
Hemoglobin: 10.5 g/dL — ABNORMAL LOW (ref 13.0–17.0)
POTASSIUM: 4.5 mmol/L (ref 3.5–5.1)
Potassium: 4.4 mmol/L (ref 3.5–5.1)
Sodium: 144 mmol/L (ref 135–145)
Sodium: 141 mmol/L (ref 135–145)

## 2018-07-04 LAB — GLUCOSE, CAPILLARY
Glucose-Capillary: 102 mg/dL — ABNORMAL HIGH (ref 70–99)
Glucose-Capillary: 137 mg/dL — ABNORMAL HIGH (ref 70–99)
Glucose-Capillary: 152 mg/dL — ABNORMAL HIGH (ref 70–99)

## 2018-07-04 SURGERY — IMPLANTATION, AORTIC VALVE, TRANSCATHETER, FEMORAL APPROACH
Anesthesia: General | Site: Chest

## 2018-07-04 MED ORDER — ROCURONIUM BROMIDE 100 MG/10ML IV SOLN
INTRAVENOUS | Status: DC | PRN
  Administered 2018-07-04: 50 mg via INTRAVENOUS

## 2018-07-04 MED ORDER — OXYCODONE HCL 5 MG PO TABS: 5.0000 mg | ORAL_TABLET | ORAL | Status: DC | PRN

## 2018-07-04 MED ORDER — SODIUM CHLORIDE 0.9% FLUSH: 3.0000 mL | INTRAVENOUS | Status: DC | PRN

## 2018-07-04 MED ORDER — CLOPIDOGREL BISULFATE 75 MG PO TABS
75.0000 mg | ORAL_TABLET | Freq: Every day | ORAL | Status: DC
  Administered 2018-07-05 – 2018-07-07 (×3): 75 mg via ORAL
  Filled 2018-07-04 (×3): qty 1

## 2018-07-04 MED ORDER — PROTAMINE SULFATE 10 MG/ML IV SOLN
INTRAVENOUS | Status: DC | PRN
  Administered 2018-07-04 (×2): 40 mg via INTRAVENOUS
  Administered 2018-07-04: 10 mg via INTRAVENOUS
  Administered 2018-07-04 (×2): 40 mg via INTRAVENOUS

## 2018-07-04 MED ORDER — MIDAZOLAM HCL 2 MG/2ML IJ SOLN
INTRAMUSCULAR | Status: AC
  Filled 2018-07-04: qty 2

## 2018-07-04 MED ORDER — CHLORHEXIDINE GLUCONATE 0.12 % MT SOLN
15.0000 mL | Freq: Once | OROMUCOSAL | Status: AC
  Administered 2018-07-04: 15 mL via OROMUCOSAL
  Filled 2018-07-04: qty 15

## 2018-07-04 MED ORDER — ALLOPURINOL 300 MG PO TABS
300.0000 mg | ORAL_TABLET | Freq: Every day | ORAL | Status: DC
  Administered 2018-07-05 – 2018-07-07 (×3): 300 mg via ORAL
  Filled 2018-07-04 (×3): qty 1

## 2018-07-04 MED ORDER — ACETAMINOPHEN 325 MG PO TABS
650.0000 mg | ORAL_TABLET | Freq: Four times a day (QID) | ORAL | Status: DC | PRN
  Administered 2018-07-04: 650 mg via ORAL

## 2018-07-04 MED ORDER — FENTANYL CITRATE (PF) 250 MCG/5ML IJ SOLN
INTRAMUSCULAR | Status: AC
  Filled 2018-07-04: qty 5

## 2018-07-04 MED ORDER — INSULIN DETEMIR 100 UNIT/ML ~~LOC~~ SOLN
20.0000 [IU] | Freq: Every morning | SUBCUTANEOUS | Status: DC
  Filled 2018-07-04: qty 0.2

## 2018-07-04 MED ORDER — PHENYLEPHRINE HCL 10 MG/ML IJ SOLN
INTRAMUSCULAR | Status: DC | PRN
  Administered 2018-07-04: 40 ug via INTRAVENOUS
  Administered 2018-07-04 (×2): 80 ug via INTRAVENOUS

## 2018-07-04 MED ORDER — SODIUM CHLORIDE 0.9 % IV SOLN
INTRAVENOUS | Status: AC
  Filled 2018-07-04: qty 1.2

## 2018-07-04 MED ORDER — SUGAMMADEX SODIUM 500 MG/5ML IV SOLN
INTRAVENOUS | Status: DC | PRN
  Administered 2018-07-04: 300 mg via INTRAVENOUS

## 2018-07-04 MED ORDER — NITROGLYCERIN IN D5W 200-5 MCG/ML-% IV SOLN: 0.0000 ug/min | INTRAVENOUS | Status: DC

## 2018-07-04 MED ORDER — LACTATED RINGERS IV SOLN
INTRAVENOUS | Status: DC | PRN
  Administered 2018-07-04 (×2): via INTRAVENOUS

## 2018-07-04 MED ORDER — SUCCINYLCHOLINE CHLORIDE 20 MG/ML IJ SOLN
INTRAMUSCULAR | Status: DC | PRN
  Administered 2018-07-04: 120 mg via INTRAVENOUS

## 2018-07-04 MED ORDER — PROPOFOL 500 MG/50ML IV EMUL
INTRAVENOUS | Status: DC | PRN
  Administered 2018-07-04: 10 ug/kg/min via INTRAVENOUS

## 2018-07-04 MED ORDER — MORPHINE SULFATE (PF) 2 MG/ML IV SOLN
1.0000 mg | INTRAVENOUS | Status: DC | PRN
  Administered 2018-07-05 – 2018-07-07 (×12): 4 mg via INTRAVENOUS
  Filled 2018-07-04 (×12): qty 2

## 2018-07-04 MED ORDER — PROTAMINE SULFATE 10 MG/ML IV SOLN
INTRAVENOUS | Status: AC
  Filled 2018-07-04: qty 25

## 2018-07-04 MED ORDER — FENTANYL CITRATE (PF) 100 MCG/2ML IJ SOLN
INTRAMUSCULAR | Status: AC
  Administered 2018-07-04: 50 ug via INTRAVENOUS
  Filled 2018-07-04: qty 2

## 2018-07-04 MED ORDER — DEXMEDETOMIDINE HCL IN NACL 80 MCG/20ML IV SOLN
INTRAVENOUS | Status: DC
  Filled 2018-07-04: qty 20

## 2018-07-04 MED ORDER — VANCOMYCIN HCL IN DEXTROSE 1-5 GM/200ML-% IV SOLN
1000.0000 mg | Freq: Once | INTRAVENOUS | Status: AC
  Administered 2018-07-04: 1000 mg via INTRAVENOUS
  Filled 2018-07-04: qty 200

## 2018-07-04 MED ORDER — CHLORHEXIDINE GLUCONATE 4 % EX LIQD: 60.0000 mL | Freq: Once | CUTANEOUS | Status: DC

## 2018-07-04 MED ORDER — LIDOCAINE HCL (PF) 1 % IJ SOLN
INTRAMUSCULAR | Status: AC
  Filled 2018-07-04: qty 30

## 2018-07-04 MED ORDER — ACETAMINOPHEN 650 MG RE SUPP
650.0000 mg | Freq: Four times a day (QID) | RECTAL | Status: DC | PRN
  Filled 2018-07-04: qty 1

## 2018-07-04 MED ORDER — HEPARIN SODIUM (PORCINE) 1000 UNIT/ML IJ SOLN
INTRAMUSCULAR | Status: DC | PRN
  Administered 2018-07-04: 17000 [IU] via INTRAVENOUS

## 2018-07-04 MED ORDER — PHENYLEPHRINE HCL-NACL 20-0.9 MG/250ML-% IV SOLN: 0.0000 ug/min | INTRAVENOUS | Status: DC

## 2018-07-04 MED ORDER — LIDOCAINE HCL (PF) 1 % IJ SOLN: INTRAMUSCULAR | Status: DC | PRN

## 2018-07-04 MED ORDER — LOSARTAN POTASSIUM 50 MG PO TABS: 100.0000 mg | ORAL_TABLET | Freq: Every day | ORAL | Status: DC

## 2018-07-04 MED ORDER — SODIUM CHLORIDE 0.9 % IV SOLN
INTRAVENOUS | Status: DC | PRN
  Administered 2018-07-04: 1500 mL

## 2018-07-04 MED ORDER — ONDANSETRON HCL 4 MG/2ML IJ SOLN
INTRAMUSCULAR | Status: DC | PRN
  Administered 2018-07-04: 4 mg via INTRAVENOUS

## 2018-07-04 MED ORDER — IODIXANOL 320 MG/ML IV SOLN
INTRAVENOUS | Status: DC | PRN
  Administered 2018-07-04: 28.28 mL via INTRAVENOUS

## 2018-07-04 MED ORDER — SODIUM CHLORIDE 0.9 % IV SOLN
1.5000 g | Freq: Two times a day (BID) | INTRAVENOUS | Status: AC
  Administered 2018-07-04 – 2018-07-06 (×4): 1.5 g via INTRAVENOUS
  Filled 2018-07-04 (×4): qty 1.5

## 2018-07-04 MED ORDER — SODIUM CHLORIDE 0.9 % IV SOLN
INTRAVENOUS | Status: AC
  Administered 2018-07-04: 50 mL/h via INTRAVENOUS

## 2018-07-04 MED ORDER — DORZOLAMIDE HCL-TIMOLOL MAL 2-0.5 % OP SOLN
1.0000 [drp] | Freq: Two times a day (BID) | OPHTHALMIC | Status: DC
  Administered 2018-07-04 – 2018-07-07 (×6): 1 [drp] via OPHTHALMIC
  Filled 2018-07-04: qty 10

## 2018-07-04 MED ORDER — INSULIN ASPART 100 UNIT/ML ~~LOC~~ SOLN
0.0000 [IU] | SUBCUTANEOUS | Status: DC
  Administered 2018-07-04 (×2): 2 [IU] via SUBCUTANEOUS
  Administered 2018-07-05: 8 [IU] via SUBCUTANEOUS
  Administered 2018-07-05: 2 [IU] via SUBCUTANEOUS

## 2018-07-04 MED ORDER — ASPIRIN 81 MG PO TABS: 81.0000 mg | ORAL_TABLET | Freq: Every day | ORAL | Status: DC

## 2018-07-04 MED ORDER — SODIUM CHLORIDE 0.9 % IV SOLN
250.0000 mL | INTRAVENOUS | Status: DC | PRN
  Administered 2018-07-05: 250 mL via INTRAVENOUS

## 2018-07-04 MED ORDER — SODIUM CHLORIDE 0.9% FLUSH
3.0000 mL | Freq: Two times a day (BID) | INTRAVENOUS | Status: DC
  Administered 2018-07-04 – 2018-07-06 (×5): 3 mL via INTRAVENOUS

## 2018-07-04 MED ORDER — METOPROLOL TARTRATE 5 MG/5ML IV SOLN
2.5000 mg | INTRAVENOUS | Status: DC | PRN
  Administered 2018-07-06: 2.5 mg via INTRAVENOUS
  Filled 2018-07-04: qty 5

## 2018-07-04 MED ORDER — SODIUM CHLORIDE 0.9 % IV SOLN
INTRAVENOUS | Status: DC
  Administered 2018-07-04: 09:00:00 via INTRAVENOUS

## 2018-07-04 MED ORDER — FENTANYL CITRATE (PF) 250 MCG/5ML IJ SOLN
INTRAMUSCULAR | Status: DC | PRN
  Administered 2018-07-04: 50 ug via INTRAVENOUS
  Administered 2018-07-04 (×2): 25 ug via INTRAVENOUS

## 2018-07-04 MED ORDER — TRAMADOL HCL 50 MG PO TABS: 50.0000 mg | ORAL_TABLET | ORAL | Status: DC | PRN

## 2018-07-04 MED ORDER — NOREPINEPHRINE BITARTRATE 1 MG/ML IV SOLN
INTRAVENOUS | Status: DC | PRN
  Administered 2018-07-04: 5 ug/min via INTRAVENOUS

## 2018-07-04 MED ORDER — ACETAMINOPHEN 325 MG PO TABS
ORAL_TABLET | ORAL | Status: AC
  Filled 2018-07-04: qty 2

## 2018-07-04 MED ORDER — VANCOMYCIN HCL 1000 MG IV SOLR
INTRAVENOUS | Status: DC | PRN
  Administered 2018-07-04: 1500 mg via INTRAVENOUS

## 2018-07-04 MED ORDER — CHLORHEXIDINE GLUCONATE 4 % EX LIQD: 30.0000 mL | CUTANEOUS | Status: DC

## 2018-07-04 MED ORDER — ASPIRIN EC 81 MG PO TBEC
81.0000 mg | DELAYED_RELEASE_TABLET | Freq: Every day | ORAL | Status: DC
  Administered 2018-07-04 – 2018-07-07 (×4): 81 mg via ORAL
  Filled 2018-07-04 (×4): qty 1

## 2018-07-04 MED ORDER — ADULT MULTIVITAMIN W/MINERALS CH
2.0000 | ORAL_TABLET | Freq: Every day | ORAL | Status: DC
  Administered 2018-07-05 – 2018-07-07 (×3): 2 via ORAL
  Filled 2018-07-04 (×3): qty 2

## 2018-07-04 MED ORDER — FENTANYL CITRATE (PF) 100 MCG/2ML IJ SOLN
50.0000 ug | Freq: Once | INTRAMUSCULAR | Status: AC
  Administered 2018-07-04: 50 ug via INTRAVENOUS

## 2018-07-04 MED ORDER — DEXAMETHASONE SODIUM PHOSPHATE 10 MG/ML IJ SOLN
INTRAMUSCULAR | Status: DC | PRN
  Administered 2018-07-04: 5 mg via INTRAVENOUS

## 2018-07-04 MED ORDER — AMLODIPINE BESYLATE 5 MG PO TABS: 5.0000 mg | ORAL_TABLET | Freq: Every day | ORAL | Status: DC

## 2018-07-04 MED ORDER — DEXMEDETOMIDINE HCL 200 MCG/2ML IV SOLN
INTRAVENOUS | Status: DC | PRN
  Administered 2018-07-04: 55 ug via INTRAVENOUS

## 2018-07-04 MED ORDER — ONDANSETRON HCL 4 MG/2ML IJ SOLN: 4.0000 mg | Freq: Four times a day (QID) | INTRAMUSCULAR | Status: DC | PRN

## 2018-07-04 SURGICAL SUPPLY — 98 items
BAG DECANTER FOR FLEXI CONT (MISCELLANEOUS) IMPLANT
BAG SNAP BAND KOVER 36X36 (MISCELLANEOUS) ×4 IMPLANT
BLADE CLIPPER SURG (BLADE) IMPLANT
BLADE OSCILLATING /SAGITTAL (BLADE) IMPLANT
BLADE STERNUM SYSTEM 6 (BLADE) IMPLANT
CABLE ADAPT CONN TEMP 6FT (ADAPTER) ×4 IMPLANT
CANISTER SUCT 3000ML PPV (MISCELLANEOUS) IMPLANT
CANNULA FEM VENOUS REMOTE 22FR (CANNULA) IMPLANT
CANNULA OPTISITE PERFUSION 16F (CANNULA) IMPLANT
CANNULA OPTISITE PERFUSION 18F (CANNULA) IMPLANT
CATH DIAG EXPO 6F VENT PIG 145 (CATHETERS) ×8 IMPLANT
CATH EXPO 5FR AL1 (CATHETERS) IMPLANT
CATH INFINITI 6F AL2 (CATHETERS) IMPLANT
CATH S G BIP PACING (CATHETERS) ×4 IMPLANT
CLIP VESOCCLUDE MED 24/CT (CLIP) ×4 IMPLANT
CLIP VESOCCLUDE SM WIDE 24/CT (CLIP) ×4 IMPLANT
CONT SPEC 4OZ CLIKSEAL STRL BL (MISCELLANEOUS) ×8 IMPLANT
COVER BACK TABLE 24X17X13 BIG (DRAPES) IMPLANT
COVER BACK TABLE 80X110 HD (DRAPES) ×8 IMPLANT
COVER DOME SNAP 22 D (MISCELLANEOUS) ×4 IMPLANT
COVER WAND RF STERILE (DRAPES) ×4 IMPLANT
CRADLE DONUT ADULT HEAD (MISCELLANEOUS) ×4 IMPLANT
DECANTER SPIKE VIAL GLASS SM (MISCELLANEOUS) ×4 IMPLANT
DERMABOND ADVANCED (GAUZE/BANDAGES/DRESSINGS) ×1
DERMABOND ADVANCED .7 DNX12 (GAUZE/BANDAGES/DRESSINGS) ×3 IMPLANT
DEVICE CLOSURE PERCLS PRGLD 6F (VASCULAR PRODUCTS) ×6 IMPLANT
DRAPE INCISE IOBAN 66X45 STRL (DRAPES) ×4 IMPLANT
DRSG ADAPTIC 3X8 NADH LF (GAUZE/BANDAGES/DRESSINGS) ×4 IMPLANT
DRSG TEGADERM 4X4.75 (GAUZE/BANDAGES/DRESSINGS) ×4 IMPLANT
ELECT CAUTERY BLADE 6.4 (BLADE) IMPLANT
ELECT REM PT RETURN 9FT ADLT (ELECTROSURGICAL) ×8
ELECTRODE REM PT RTRN 9FT ADLT (ELECTROSURGICAL) ×6 IMPLANT
FELT TEFLON 6X6 (MISCELLANEOUS) ×4 IMPLANT
FEMORAL VENOUS CANN RAP (CANNULA) IMPLANT
GAUZE SPONGE 4X4 12PLY STRL (GAUZE/BANDAGES/DRESSINGS) ×4 IMPLANT
GLOVE BIO SURGEON STRL SZ7.5 (GLOVE) IMPLANT
GLOVE BIO SURGEON STRL SZ8 (GLOVE) IMPLANT
GLOVE EUDERMIC 7 POWDERFREE (GLOVE) IMPLANT
GLOVE ORTHO TXT STRL SZ7.5 (GLOVE) IMPLANT
GOWN STRL REUS W/ TWL LRG LVL3 (GOWN DISPOSABLE) IMPLANT
GOWN STRL REUS W/ TWL XL LVL3 (GOWN DISPOSABLE) ×3 IMPLANT
GOWN STRL REUS W/TWL LRG LVL3 (GOWN DISPOSABLE)
GOWN STRL REUS W/TWL XL LVL3 (GOWN DISPOSABLE) ×1
GUIDEWIRE SAF TJ AMPL .035X180 (WIRE) ×4 IMPLANT
GUIDEWIRE SAFE TJ AMPLATZ EXST (WIRE) ×4 IMPLANT
GUIDEWIRE STRAIGHT .035 260CM (WIRE) ×4 IMPLANT
INSERT FOGARTY SM (MISCELLANEOUS) IMPLANT
KIT BASIN OR (CUSTOM PROCEDURE TRAY) ×4 IMPLANT
KIT DILATOR VASC 18G NDL (KITS) IMPLANT
KIT HEART LEFT (KITS) ×4 IMPLANT
KIT SUCTION CATH 14FR (SUCTIONS) ×8 IMPLANT
KIT TURNOVER KIT B (KITS) ×4 IMPLANT
LOOP VESSEL MAXI BLUE (MISCELLANEOUS) IMPLANT
LOOP VESSEL MINI RED (MISCELLANEOUS) IMPLANT
NEEDLE 22X1 1/2 (OR ONLY) (NEEDLE) ×4 IMPLANT
NEEDLE PERC 18GX7CM (NEEDLE) ×4 IMPLANT
NS IRRIG 1000ML POUR BTL (IV SOLUTION) ×12 IMPLANT
PACK ENDO MINOR (CUSTOM PROCEDURE TRAY) ×4 IMPLANT
PAD ARMBOARD 7.5X6 YLW CONV (MISCELLANEOUS) ×8 IMPLANT
PAD ELECT DEFIB RADIOL ZOLL (MISCELLANEOUS) ×4 IMPLANT
PENCIL BUTTON HOLSTER BLD 10FT (ELECTRODE) ×8 IMPLANT
PERCLOSE PROGLIDE 6F (VASCULAR PRODUCTS) ×8
SET MICROPUNCTURE 5F STIFF (MISCELLANEOUS) ×4 IMPLANT
SHEATH BRITE TIP 6FR 35CM (SHEATH) ×4 IMPLANT
SHEATH PINNACLE 6F 10CM (SHEATH) ×4 IMPLANT
SHEATH PINNACLE 8F 10CM (SHEATH) ×4 IMPLANT
SLEEVE REPOSITIONING LENGTH 30 (MISCELLANEOUS) ×4 IMPLANT
SPONGE GAUZE 2X2 8PLY STRL LF (GAUZE/BANDAGES/DRESSINGS) ×4 IMPLANT
SPONGE LAP 4X18 RFD (DISPOSABLE) ×4 IMPLANT
STOPCOCK MORSE 400PSI 3WAY (MISCELLANEOUS) ×12 IMPLANT
SUT ETHIBOND X763 2 0 SH 1 (SUTURE) IMPLANT
SUT GORETEX CV 4 TH 22 36 (SUTURE) IMPLANT
SUT GORETEX CV4 TH-18 (SUTURE) IMPLANT
SUT MNCRL AB 3-0 PS2 18 (SUTURE) IMPLANT
SUT PROLENE 5 0 C 1 36 (SUTURE) IMPLANT
SUT PROLENE 6 0 C 1 30 (SUTURE) IMPLANT
SUT SILK  1 MH (SUTURE) ×1
SUT SILK 1 MH (SUTURE) ×3 IMPLANT
SUT VIC AB 2-0 CT1 27 (SUTURE)
SUT VIC AB 2-0 CT1 TAPERPNT 27 (SUTURE) IMPLANT
SUT VIC AB 2-0 CTX 36 (SUTURE) IMPLANT
SUT VIC AB 3-0 SH 8-18 (SUTURE) IMPLANT
SYR 50ML LL SCALE MARK (SYRINGE) ×4 IMPLANT
SYR BULB IRRIGATION 50ML (SYRINGE) IMPLANT
SYR CONTROL 10ML LL (SYRINGE) IMPLANT
TAPE CLOTH SURG 4X10 WHT LF (GAUZE/BANDAGES/DRESSINGS) ×4 IMPLANT
TOWEL GREEN STERILE (TOWEL DISPOSABLE) ×8 IMPLANT
TOWEL GREEN STERILE FF (TOWEL DISPOSABLE) ×4 IMPLANT
TRANSDUCER W/STOPCOCK (MISCELLANEOUS) ×8 IMPLANT
TRAY FOLEY SLVR 14FR TEMP STAT (SET/KITS/TRAYS/PACK) IMPLANT
TRAY FOLEY SLVR 16FR TEMP STAT (SET/KITS/TRAYS/PACK) IMPLANT
TUBE SUCT INTRACARD DLP 20F (MISCELLANEOUS) IMPLANT
URINAL MALE W/LID DISP 1000CC (MISCELLANEOUS) IMPLANT
VALVE HEART TRANSCATH SZ3 26MM (Valve) ×4 IMPLANT
WIRE .035 3MM-J 145CM (WIRE) ×4 IMPLANT
WIRE BENTSON .035X145CM (WIRE) ×4 IMPLANT
WIRE STIFF LUNDERQUIST 260CM (WIRE) ×4 IMPLANT
WIRE TORQFLEX AUST .018X40CM (WIRE) ×4 IMPLANT

## 2018-07-04 NOTE — Anesthesia Procedure Notes (Signed)
Central Venous Catheter Insertion Performed by: Kipp Brood, MD, anesthesiologist Start/End1/21/2020 9:50 AM, 07/04/2018 10:00 AM Patient location: Pre-op. Preanesthetic checklist: patient identified, IV checked, site marked, risks and benefits discussed, surgical consent, monitors and equipment checked, pre-op evaluation, timeout performed and anesthesia consent Lidocaine 1% used for infiltration and patient sedated Hand hygiene performed  and maximum sterile barriers used  Catheter size: 8 Fr Total catheter length 16. Central line was placed.Double lumen Procedure performed using ultrasound guided technique. Ultrasound Notes:image(s) printed for medical record Attempts: 1 Following insertion, dressing applied and line sutured. Post procedure assessment: blood return through all ports  Patient tolerated the procedure well with no immediate complications.

## 2018-07-04 NOTE — Interval H&P Note (Signed)
History and Physical Interval Note:  07/04/2018 11:07 AM  [name] [name]  has presented today for surgery, with the diagnosis of severe aortic stenosis  The various methods of treatment have been discussed with the patient and family. After consideration of risks, benefits and other options for treatment, the patient has consented to  Procedure(s) with comments: TRANSCATHETER AORTIC VALVE REPLACEMENT, TRANSFEMORAL (N/A) TRANSESOPHAGEAL ECHOCARDIOGRAM (TEE) (N/A) TRANSCATHETER AORTIC VALVE REPLACEMENT,SUBCLAVIAN (N/A) - possible subclavian INTRAOPERATIVE TRANSTHORACIC ECHOCARDIOGRAM as a surgical intervention .  The patient's history has been reviewed, patient examined, no change in status, stable for surgery.  I have reviewed the patient's chart and labs.  Questions were answered to the patient's satisfaction.    Reviewed case extensively with Dr Cornelius Moras. Plan transfemoral approach with possible subclavian approach if TF unsuccessful. Pt evaluated and no changes since time of this consultation.    Tonny Bollman

## 2018-07-04 NOTE — Progress Notes (Signed)
Pt arrived to 4e from [facility] cath lab. Pt oriented to room and staff. Vitals obtained. Telemetry box applied and CCMD notified x2. Bilateral groin sites assessed. Scant oozing from skin tears on groins bilaterally. Skin tears present prior to admission. Son at bedside.   Ardeen Jourdain BSN, RN

## 2018-07-04 NOTE — Op Note (Signed)
HEART AND VASCULAR CENTER   MULTIDISCIPLINARY HEART VALVE TEAM   TAVR OPERATIVE NOTE   Date of Procedure:  07/04/2018  Preoperative Diagnosis: Severe Aortic Stenosis   Postoperative Diagnosis: Same   Procedure:   Transcatheter Aortic Valve Replacement - Percutaneous Transfemoral Approach  Edwards Sapien 3 THV (size 26 mm, model # 9600TFX, serial # [id number])   Co-Surgeons:  Salvatore Decent. Cornelius Moras, MD and Tonny Bollman, MD  Anesthesiologist:  Kipp Brood, MD  Echocardiographer:  Charlton Haws, MD  Pre-operative Echo Findings:  Severe aortic stenosis  Normal left ventricular systolic function  Post-operative Echo Findings:  1-2+ paravalvular leak  Normal left ventricular systolic function  BRIEF CLINICAL NOTE AND INDICATIONS FOR SURGERY  This is a complex patient who is 85 years old with medical comorbidities of morbid obesity, type 2 diabetes, obstructive sleep apnea on CPAP, and history of AAA status post aortobiiliac grafting.  He presents now with progressive symptoms of severe aortic stenosis and is referred for TAVR.  During the course of the patient's preoperative work up they have been evaluated comprehensively by a multidisciplinary team of specialists coordinated through the Multidisciplinary Heart Valve Clinic in the [facility].  They have been demonstrated to suffer from symptomatic severe aortic stenosis as noted above. The patient has been counseled extensively as to the relative risks and benefits of all options for the treatment of severe aortic stenosis including long term medical therapy, conventional surgery for aortic valve replacement, and transcatheter aortic valve replacement.  The patient has been independently evaluated by Dr. Cornelius Moras and Dr. Clifton James. Both physicians indicated the patient would be a poor candidate for conventional surgery because of comorbidities including advanced age, obesity, poor functional status, and PAD with  aortic aneurysm and previous aortoiliac grafting.   Based upon review of all of the patient's preoperative diagnostic tests they are felt to be candidate for transcatheter aortic valve replacement using the transfemoral approach as an alternative to high risk conventional surgery.    Following the decision to proceed with transcatheter aortic valve replacement, a discussion has been held regarding what types of management strategies would be attempted intraoperatively in the event of life-threatening complications, including whether or not the patient would be considered a candidate for the use of cardiopulmonary bypass and/or conversion to open sternotomy for attempted surgical intervention.  The patient has been advised of a variety of complications that might develop peculiar to this approach including but not limited to risks of death, stroke, paravalvular leak, aortic dissection or other major vascular complications, aortic annulus rupture, device embolization, cardiac rupture or perforation, acute myocardial infarction, arrhythmia, heart block or bradycardia requiring permanent pacemaker placement, congestive heart failure, respiratory failure, renal failure, pneumonia, infection, other late complications related to structural valve deterioration or migration, or other complications that might ultimately cause a temporary or permanent loss of functional independence or other long term morbidity.  The patient provides full informed consent for the procedure as described and all questions were answered preoperatively.  DETAILS OF THE OPERATIVE PROCEDURE  PREPARATION:   The patient is brought to the operating room on the above mentioned date and central monitoring was established by the anesthesia team including placement of a central venous catheter and radial arterial line. The patient is placed in the supine position on the operating table.  Intravenous antibiotics are administered. General endotracheal  anesthesia is induced uneventfully.  Baseline transesophageal echocardiogram is performed. The patient's chest, abdomen, both groins, and both lower extremities are prepared  and draped in a sterile manner. A time out procedure is performed.  PERIPHERAL ACCESS:   Using ultrasound guidance, femoral arterial and venous access is obtained with placement of 6 Fr sheaths on the left side.  A pigtail diagnostic catheter was passed through the femoral arterial sheath under fluoroscopic guidance into the aortic root.  A temporary transvenous pacemaker catheter was passed through the femoral venous sheath under fluoroscopic guidance into the right ventricle.  The pacemaker was tested to ensure stable lead placement and pacemaker capture. Aortic root angiography was performed in order to determine the optimal angiographic angle for valve deployment.  TRANSFEMORAL ACCESS:  A micropuncture technique is used to access the right femoral artery under fluoroscopic and ultrasound guidance.  2 Perclose devices are deployed at 10' and 2' positions to 'PreClose' the femoral artery. An 8 French sheath is placed and then an Amplatz Superstiff wire is advanced through the sheath. This is changed out for a 14 French transfemoral E-Sheath after progressively dilating over the Superstiff wire.  An AL-2 catheter was used to direct a straight-tip exchange length wire across the native aortic valve into the left ventricle. This was exchanged out for a pigtail catheter and position was confirmed in the LV apex. Simultaneous LV and Ao pressures were recorded.  The pigtail catheter was exchanged for an Amplatz Extra-stiff wire in the LV apex.  Echocardiography was utilized to confirm appropriate wire position and no sign of entanglement in the mitral subvalvular apparatus.  BALLOON AORTIC VALVULOPLASTY:  Not performed  TRANSCATHETER HEART VALVE DEPLOYMENT:  An Edwards Sapien 3 transcatheter heart valve (size 26 mm) was prepared and  crimped per manufacturer's guidelines, and the proper orientation of the valve is confirmed on the [employer] Commander delivery system. The valve was advanced through the introducer sheath using normal technique until in an appropriate position in the abdominal aorta beyond the sheath tip. The balloon was then retracted and using the fine-tuning wheel was centered on the valve. The valve was then advanced across the aortic arch using appropriate flexion of the catheter. The valve was carefully positioned across the aortic valve annulus. The Commander catheter was retracted using normal technique. Once final position of the valve has been confirmed by angiographic assessment, the valve is deployed while temporarily holding ventilation and during rapid ventricular pacing to maintain systolic blood pressure < 50 mmHg and pulse pressure < 10 mmHg. The balloon inflation is held for >3 seconds after reaching full deployment volume. Once the balloon has fully deflated the balloon is retracted into the ascending aorta and valve function is assessed using echocardiography. There is felt to be mild to moderate paravalvular leak and no central aortic insufficiency.  Because of the patient's paravalvular regurgitation, 2 cc of additional fluid is added to the atrion device and the TAVR valve is postdilated.  The patient's hemodynamic recovery following valve deployment is good.  The deployment balloon and guidewire are both removed. Echo demostrated acceptable post-procedural gradients, stable mitral valve function, and mild aortic insufficiency.   PROCEDURE COMPLETION:  The sheath was removed and femoral artery closure is performed using the 2 previously deployed Perclose devices.  Protamine is administered once femoral arterial repair was complete. The site is clear with no evidence of bleeding or hematoma after the sutures are tightened. The temporary pacemaker, pigtail catheters and femoral sheaths were removed with manual  pressure used for hemostasis.   The patient tolerated the procedure well and is transported to the surgical intensive care in stable condition.  There were no immediate intraoperative complications. All sponge instrument and needle counts are verified correct at completion of the operation.   The patient received a total of 28.3 mL of intravenous contrast during the procedure.  Tonny Bollman, MD 07/04/2018 2:06 PM

## 2018-07-04 NOTE — Anesthesia Procedure Notes (Signed)
Procedure Name: Intubation Date/Time: 07/04/2018 11:14 AM Performed by: Marena Chancy, CRNA Pre-anesthesia Checklist: Patient identified, Emergency Drugs available, Suction available and Patient being monitored Patient Re-evaluated:Patient Re-evaluated prior to induction Oxygen Delivery Method: Circle System Utilized Preoxygenation: Pre-oxygenation with 100% oxygen Induction Type: IV induction and Rapid sequence Ventilation: Mask ventilation without difficulty Laryngoscope Size: Glidescope and 3 Grade View: Grade I Tube type: Oral Tube size: 8.0 mm Number of attempts: 1 Airway Equipment and Method: Stylet and Oral airway Placement Confirmation: ETT inserted through vocal cords under direct vision,  positive ETCO2 and breath sounds checked- equal and bilateral Tube secured with: Tape Dental Injury: Teeth and Oropharynx as per pre-operative assessment

## 2018-07-04 NOTE — Progress Notes (Signed)
Patient interviewed in preop area. Patient able to confirm name, DOB, procedure, allergies, no metal in body, npo status and no pain at this time. Patient son at beside.  Yvetta Coder, RN

## 2018-07-04 NOTE — Anesthesia Preprocedure Evaluation (Signed)
Anesthesia Evaluation  Patient identified by MRN, date of birth, ID band Patient awake    Reviewed: Allergy & Precautions, NPO status , Patient's Chart, lab work & pertinent test results  Airway Mallampati: III  TM Distance: >3 FB Neck ROM: Limited    Dental  (+) Teeth Intact, Dental Advisory Given   Pulmonary former smoker,    breath sounds clear to auscultation + decreased breath sounds      Cardiovascular hypertension,  Rhythm:Regular Rate:Normal + Systolic murmurs    Neuro/Psych    GI/Hepatic   Endo/Other  diabetes  Renal/GU      Musculoskeletal   Abdominal (+) + obese,   Peds  Hematology   Anesthesia Other Findings   Reproductive/Obstetrics                             Anesthesia Physical Anesthesia Plan  ASA: III  Anesthesia Plan: General   Post-op Pain Management:    Induction: Intravenous  PONV Risk Score and Plan: Ondansetron  Airway Management Planned: Simple Face Mask and Natural Airway  Additional Equipment: Arterial line, CVP and Ultrasound Guidance Line Placement  Intra-op Plan:   Post-operative Plan:   Informed Consent: I have reviewed the patients History and Physical, chart, labs and discussed the procedure including the risks, benefits and alternatives for the proposed anesthesia with the patient or authorized representative who has indicated his/her understanding and acceptance.       Plan Discussed with: Anesthesiologist and CRNA  Anesthesia Plan Comments:         Anesthesia Quick Evaluation

## 2018-07-04 NOTE — Progress Notes (Signed)
  Echocardiogram Echocardiogram Transesophageal has been performed.  Leta JunglingCooper, Juleon Narang M 07/04/2018, 12:44 PM

## 2018-07-04 NOTE — Discharge Instructions (Signed)

## 2018-07-04 NOTE — Progress Notes (Signed)
TCTS BRIEF PROGRESS NOTE  Day of Surgery  S/P Procedure(s) (LRB): TRANSCATHETER AORTIC VALVE REPLACEMENT, TRANSFEMORAL (N/A) TRANSESOPHAGEAL ECHOCARDIOGRAM (TEE) INTRAOPERATIVE TRANSTHORACIC ECHOCARDIOGRAM   Awake and alert No chest pain, SOB Reports feeling well except for a slight headache NSR w/ stable BP Both groins okay  Plan: Continue routine care  [name] Nails, MD 07/04/2018 5:06 PM

## 2018-07-04 NOTE — Anesthesia Postprocedure Evaluation (Signed)
Anesthesia Post Note  Patient: [name] [name]  Procedure(s) Performed: TRANSCATHETER AORTIC VALVE REPLACEMENT, TRANSFEMORAL (N/A Chest) TRANSESOPHAGEAL ECHOCARDIOGRAM (TEE) (N/A Esophagus) TRANSCATHETER AORTIC VALVE REPLACEMENT,SUBCLAVIAN (N/A Chest) INTRAOPERATIVE TRANSTHORACIC ECHOCARDIOGRAM (Chest)     Patient location during evaluation: Cath Lab Anesthesia Type: General Level of consciousness: awake and alert Pain management: pain level controlled Vital Signs Assessment: post-procedure vital signs reviewed and stable Respiratory status: spontaneous breathing, nonlabored ventilation, respiratory function stable and patient connected to nasal cannula oxygen Cardiovascular status: blood pressure returned to baseline and stable Postop Assessment: no apparent nausea or vomiting Anesthetic complications: no    Last Vitals:  Vitals:   07/04/18 1350 07/04/18 1355  BP: (!) 160/67 (!) 163/59  Pulse: 67 62  Resp: 19 17  Temp:    SpO2: 95% 96%    Last Pain:  Vitals:   07/04/18 1259  TempSrc:   PainSc: 0-No pain                 Micalah Cabezas COKER

## 2018-07-04 NOTE — Anesthesia Procedure Notes (Signed)
Arterial Line Insertion Start/End1/21/2020 9:00 AM, 07/04/2018 9:12 AM Performed by: Adair Laundry, CRNA, CRNA  Patient location: Pre-op. Preanesthetic checklist: patient identified, IV checked, risks and benefits discussed, surgical consent, monitors and equipment checked and pre-op evaluation Lidocaine 1% used for infiltration Right, radial was placed Catheter size: 20 G Hand hygiene performed , maximum sterile barriers used  and Seldinger technique used  Attempts: 1 Procedure performed without using ultrasound guided technique. Following insertion, dressing applied and Biopatch. Post procedure assessment: normal  Patient tolerated the procedure well with no immediate complications.

## 2018-07-04 NOTE — Op Note (Signed)
[facility]   MULTIDISCIPLINARY HEART VALVE TEAM   TAVR OPERATIVE NOTE   Date of Procedure:  07/04/2018  Preoperative Diagnosis: Severe Aortic Stenosis   Postoperative Diagnosis: Same   Procedure:    Transcatheter Aortic Valve Replacement - Percutaneous Right Transfemoral Approach  Edwards Sapien 3 THV (size 26 mm, model # 9600TFX, serial # [id number])   Co-Surgeons:  Salvatore Decentlarence H. Cornelius Moraswen, MD and Tonny BollmanMichael Cooper, MD  Anesthesiologist:  Kipp Broodavid Joslin, MD  Echocardiographer:  Charlton HawsPeter Nishan, MD  Pre-operative Echo Findings:  Severe aortic stenosis  Normal left ventricular systolic function  Post-operative Echo Findings:  Mild (1+/2+) paravalvular leak  Normal left ventricular systolic function   BRIEF CLINICAL NOTE AND INDICATIONS FOR SURGERY  Patient is an 85 year old morbidly obese male with history of aortic stenosis, type 2 diabetes mellitus on insulin, GE reflux disease with hiatal hernia, cholelithiasis, diverticulosis, ventral incisional hernia, hypertension, OSA on CPAP, peripheral arterial disease, and remote history of previous aortobiiliac grafting for abdominal aortic aneurysm who has been referred for surgical consultation to discuss treatment options for management of severe symptomatic aortic stenosis.  Patient is morbidly obese and has been very sedentary for many years.  He has very limited physical activity.  He admits to a long history of exertional shortness of breath.  He has had off-and-on problems with abdominal pain and nausea for the past year and a half.  Gallbladder ultrasound revealed cholelithiasis without evidence of cholecystitis.  Upper GI barium contrast study was unrevealing.  The patient eventually underwent a gallbladder nuclear scan which revealed a low gallbladder ejection fraction.  The possibility of elective cholecystectomy was considered.  Because of the presence of a heart murmur on physical exam and atypical symptoms of chest  pain patient was referred for cardiology clearance.  The patient had previously seen Dr. [place] in the remote past and underwent a stress Myoview exam in 2014 that was felt to be low risk.  Echocardiogram performed at that time revealed mild aortic stenosis with normal left ventricular systolic function.  Patient was seen in follow-up by Dr. [place] on May 26, 2018 at the request of the patient's primary care physician for preoperative clearance for possible cholecystectomy.  Follow-up echocardiogram performed May 30, 2018 revealed severe aortic stenosis with preserved left ventricular systolic function.  Peak velocity across aortic valve measured 4.1 m/s corresponding to mean transvalvular gradient estimated 42 mmHg.  The DVI was notably quite low at 0.17 with aortic valve area calculated only 0.54 cm.  Left ventricular ejection fraction was estimated 55 to 60%.  Diagnostic cardiac catheterization performed June 13, 2018 revealed mild nonobstructive coronary artery disease with exception of 80% stenosis of her RV marginal branch.  Catheterization confirmed the presence of severe aortic stenosis with peak to peak and mean transvalvular gradients measured 66 and 45 mmHg, respectively.  Aortic valve area was calculated 0.9 cm.  There was normal right-sided pressures and normal cardiac output.  The patient was referred to the multidisciplinary heart valve clinic and has been evaluated previously by Dr. Clifton JamesMcAlhany.  CT angiography was performed and cardiothoracic surgical consultation requested.  During the course of the patient's preoperative work up they have been evaluated comprehensively by a multidisciplinary team of specialists coordinated through the Multidisciplinary Heart Valve Clinic in the [facility].  They have been demonstrated to suffer from symptomatic severe aortic stenosis as noted above. The patient has been counseled extensively as to the relative risks and  benefits of all options  for the treatment of severe aortic stenosis including long term medical therapy, conventional surgery for aortic valve replacement, and transcatheter aortic valve replacement.  All questions have been answered, and the patient provides full informed consent for the operation as described.   DETAILS OF THE OPERATIVE PROCEDURE  PREPARATION:    The patient is brought to the operating room on the above mentioned date and central monitoring was established by the anesthesia team including placement of a central venous line and radial arterial line. The patient is placed in the supine position on the operating table.  Intravenous antibiotics are administered.  Initially intravenous sedation is administered with intention of performing the procedure with conscious sedation.  However, the patient had periods of apnea and difficulty staying still.  Ultimately decision is made to proceed with general endotracheal anesthesia.  General endotracheal anesthesia is induced uneventfully.  Baseline transesophageal echocardiogram was performed. The patient's chest, abdomen, both groins, and both lower extremities are prepared and draped in a sterile manner. A time out procedure is performed.   PERIPHERAL ACCESS:    Using the modified Seldinger technique, femoral arterial and venous access was obtained with placement of 6 Fr sheaths on the left side.  A pigtail diagnostic catheter was passed through the left arterial sheath under fluoroscopic guidance into the aortic root.  A temporary transvenous pacemaker catheter was passed through the left femoral venous sheath under fluoroscopic guidance into the right ventricle.  The pacemaker was tested to ensure stable lead placement and pacemaker capture. Aortic root angiography was performed in order to determine the optimal angiographic angle for valve deployment.   TRANSFEMORAL ACCESS:   Percutaneous transfemoral access and sheath placement was  performed using ultrasound guidance.  The right common femoral artery was cannulated using a micropuncture needle and appropriate location was verified using hand injection angiogram.  A pair of Abbott Perclose percutaneous closure devices were placed and a 6 French sheath replaced into the femoral artery.  The patient was heparinized systemically and ACT verified > 250 seconds.    A 14 Fr transfemoral E-sheath was introduced into the right common femoral artery after progressively dilating over Lunderquist wire. An AL-1 catheter was used to direct a straight-tip exchange length wire across the native aortic valve into the left ventricle. This was exchanged out for a pigtail catheter and position was confirmed in the LV apex. Simultaneous LV and Ao pressures were recorded.  The pigtail catheter was exchanged for an Amplatz Extra-stiff wire in the LV apex.  Echocardiography was utilized to confirm appropriate wire position and no sign of entanglement in the mitral subvalvular apparatus.   TRANSCATHETER HEART VALVE DEPLOYMENT:   An Edwards Sapien 3 transcatheter heart valve (size 26 mm, model #9600TFX, serial #[id number]) was prepared and crimped per manufacturer's guidelines, and the proper orientation of the valve is confirmed on the [employer] delivery system. The valve was advanced through the introducer sheath using normal technique until in an appropriate position in the abdominal aorta beyond the sheath tip. The balloon was then retracted and using the fine-tuning wheel was centered on the valve. The valve was then advanced across the aortic arch using appropriate flexion of the catheter. The valve was carefully positioned across the aortic valve annulus. The Commander catheter was retracted using normal technique. Once final position of the valve has been confirmed by angiographic assessment, the valve is deployed while temporarily holding ventilation and during rapid ventricular pacing to maintain  systolic blood pressure < 50 mmHg and pulse  pressure < 10 mmHg. The balloon inflation is held for >3 seconds after reaching full deployment volume. Once the balloon has fully deflated the balloon is retracted into the ascending aorta and valve function is assessed using echocardiography. There is felt to be mild (1+/2+) paravalvular leak and no central aortic insufficiency.  Subsequently decision was made to proceed with postdilatation.  A total of 2.0 mL of saline is added to the atrion device for balloon insufflation.  The balloon was readvanced over the guidewire across the valve.  Postdilatation was performed again using rapid ventricular pacing to maintain systolic blood pressure less than 50 mmHg and pulse pressure less than 10 mmHg.  After postdilatation is complete the balloon and guidewire are both removed from the left ventricle and the valve function reassessed using echocardiography.  There is perhaps slight improvement in the severity of paravalvular leak.  No other abnormalities are noted.  The patient's hemodynamic recovery following valve deployment and postdilatation are uneventful.     PROCEDURE COMPLETION:   The sheath was removed and femoral artery closure performed.  Protamine was administered once femoral arterial repair was complete. The temporary pacemaker, pigtail catheters and femoral sheaths were removed with manual pressure used for hemostasis.   The patient tolerated the procedure well and is transported to the surgical intensive care in stable condition. There were no immediate intraoperative complications. All sponge instrument and needle counts are verified correct at completion of the operation.   No blood products were administered during the operation.  The patient received a total of 28.3 mL of intravenous contrast during the procedure.   Purcell Nailslarence H Harlen Danford, MD 07/04/2018 12:52 PM

## 2018-07-04 NOTE — Transfer of Care (Signed)
Immediate Anesthesia Transfer of Care Note  Patient: [name] [name]  Procedure(s) Performed: TRANSCATHETER AORTIC VALVE REPLACEMENT, TRANSFEMORAL (N/A Chest) TRANSESOPHAGEAL ECHOCARDIOGRAM (TEE) (N/A Esophagus) TRANSCATHETER AORTIC VALVE REPLACEMENT,SUBCLAVIAN (N/A Chest) INTRAOPERATIVE TRANSTHORACIC ECHOCARDIOGRAM (Chest)  Patient Location: Cath Lab  Anesthesia Type:MAC and General  Level of Consciousness: awake, alert  and oriented  Airway & Oxygen Therapy: Patient Spontanous Breathing and Patient connected to nasal cannula oxygen  Post-op Assessment: Report given to RN, Post -op Vital signs reviewed and stable and Patient moving all extremities X 4  Post vital signs: Reviewed and stable  Last Vitals:  Vitals Value Taken Time  BP 147/75 07/04/2018  1:17 PM  Temp    Pulse 72 07/04/2018  1:20 PM  Resp 18 07/04/2018  1:20 PM  SpO2 96 % 07/04/2018  1:20 PM  Vitals shown include unvalidated device data.  Last Pain:  Vitals:   07/04/18 1259  TempSrc:   PainSc: 0-No pain      Patients Stated Pain Goal: 3 (64/38/[id number])  Complications: No apparent anesthesia complications

## 2018-07-05 ENCOUNTER — Encounter (HOSPITAL_COMMUNITY): Payer: Self-pay | Admitting: Cardiovascular Disease

## 2018-07-05 ENCOUNTER — Inpatient Hospital Stay (HOSPITAL_COMMUNITY): Payer: Medicare Other

## 2018-07-05 DIAGNOSIS — I351 Nonrheumatic aortic (valve) insufficiency: Secondary | ICD-10-CM

## 2018-07-05 DIAGNOSIS — I350 Nonrheumatic aortic (valve) stenosis: Secondary | ICD-10-CM

## 2018-07-05 DIAGNOSIS — Z954 Presence of other heart-valve replacement: Secondary | ICD-10-CM

## 2018-07-05 LAB — GLUCOSE, CAPILLARY
GLUCOSE-CAPILLARY: 126 mg/dL — AB (ref 70–99)
Glucose-Capillary: 100 mg/dL — ABNORMAL HIGH (ref 70–99)
Glucose-Capillary: 117 mg/dL — ABNORMAL HIGH (ref 70–99)
Glucose-Capillary: 126 mg/dL — ABNORMAL HIGH (ref 70–99)
Glucose-Capillary: 156 mg/dL — ABNORMAL HIGH (ref 70–99)
Glucose-Capillary: 219 mg/dL — ABNORMAL HIGH (ref 70–99)

## 2018-07-05 LAB — CBC
HCT: 27.2 % — ABNORMAL LOW (ref 39.0–52.0)
Hemoglobin: 8.7 g/dL — ABNORMAL LOW (ref 13.0–17.0)
MCH: 34.3 pg — AB (ref 26.0–34.0)
MCHC: 32 g/dL (ref 30.0–36.0)
MCV: 107.1 fL — ABNORMAL HIGH (ref 80.0–100.0)
Platelets: 89 10*3/uL — ABNORMAL LOW (ref 150–400)
RBC: 2.54 MIL/uL — ABNORMAL LOW (ref 4.22–5.81)
RDW: 15.1 % (ref 11.5–15.5)
WBC: 11.2 10*3/uL — ABNORMAL HIGH (ref 4.0–10.5)
nRBC: 0 % (ref 0.0–0.2)

## 2018-07-05 LAB — BASIC METABOLIC PANEL
Anion gap: 8 (ref 5–15)
BUN: 39 mg/dL — AB (ref 8–23)
CO2: 18 mmol/L — ABNORMAL LOW (ref 22–32)
Calcium: 8.2 mg/dL — ABNORMAL LOW (ref 8.9–10.3)
Chloride: 113 mmol/L — ABNORMAL HIGH (ref 98–111)
Creatinine, Ser: 2.53 mg/dL — ABNORMAL HIGH (ref 0.61–1.24)
GFR calc Af Amer: 26 mL/min — ABNORMAL LOW (ref >60)
GFR calc non Af Amer: 22 mL/min — ABNORMAL LOW (ref >60)
Glucose, Bld: 164 mg/dL — ABNORMAL HIGH (ref 70–99)
Potassium: 4.5 mmol/L (ref 3.5–5.1)
SODIUM: 139 mmol/L (ref 135–145)

## 2018-07-05 LAB — ECHOCARDIOGRAM COMPLETE
Height: 70 in
Weight: 3756.8 oz

## 2018-07-05 LAB — ECHO INTRAOPERATIVE TEE
HEIGHTINCHES: 70 in
WEIGHTICAEL: 3756.8 [oz_av]

## 2018-07-05 LAB — MAGNESIUM: Magnesium: 2 mg/dL (ref 1.7–2.4)

## 2018-07-05 MED ORDER — INSULIN ASPART 100 UNIT/ML ~~LOC~~ SOLN: 0.0000 [IU] | Freq: Every day | SUBCUTANEOUS | Status: DC

## 2018-07-05 MED ORDER — SODIUM CHLORIDE 0.9 % IV SOLN
INTRAVENOUS | Status: AC
  Administered 2018-07-05: 01:00:00 via INTRAVENOUS

## 2018-07-05 MED ORDER — ACETAMINOPHEN 325 MG PO TABS: 650.0000 mg | ORAL_TABLET | Freq: Four times a day (QID) | ORAL | Status: DC | PRN

## 2018-07-05 MED ORDER — INSULIN DETEMIR 100 UNIT/ML ~~LOC~~ SOLN
34.0000 [IU] | Freq: Every morning | SUBCUTANEOUS | Status: DC
  Administered 2018-07-05 – 2018-07-07 (×3): 34 [IU] via SUBCUTANEOUS
  Filled 2018-07-05 (×3): qty 0.34

## 2018-07-05 MED ORDER — INSULIN ASPART 100 UNIT/ML ~~LOC~~ SOLN
0.0000 [IU] | Freq: Three times a day (TID) | SUBCUTANEOUS | Status: DC
  Administered 2018-07-05: 3 [IU] via SUBCUTANEOUS

## 2018-07-05 NOTE — Progress Notes (Signed)
CARDIAC REHAB PHASE I   PRE:  Rate/Rhythm: 77 SR  BP:  Supine:   Sitting: 134/45  Standing:    SaO2: 99%RA  MODE:  Ambulation: 300 ft   POST:  Rate/Rhythm: 122 occas PVC    77 with rest BP:  Supine:   Sitting: 149/39  Standing:    SaO2: 98%RA 1335-1354 Pt walked 300 ft on RA with rolling walker and asst x 2. Stopped once to rest. Some DOE but pt stated felt that breathing might be a little better after TAVR. Had a little difficulty with turning. To bed after walk. Heart rate down with rest to 77.   Luetta Nutting, RN BSN  07/05/2018 1:49 PM

## 2018-07-05 NOTE — Discharge Summary (Signed)
Physician Discharge Summary       [address] [place].[address]       [place] [address]             [phone]    Patient ID: [name] [name] MRN: [id number] DOB/AGE: 83/19/1933 83 y.o.  Admit date: 07/04/2018 Discharge date: 07/07/2018  Admission Diagnoses: Severe aortic stenosis  Discharge Diagnoses:  1. S/P TAVR (transcatheter aortic valve replacement) 2. History of hypertension 3. History of PVD (peripheral vascular disease) (HCC) 4. History of diabetes mellitus, type 2 (HCC) 5. History of hypercholesterolemia 6. History of sleep apnea 7. History of CKD (chronic kidney disease) stage 3, GFR 30-59 ml/min (HCC) 8. History of CAD (coronary artery disease) 9. History of gout 10. History of hiatal hernia 11. History of acid reflux  Procedure (s):   Transcatheter Aortic Valve Replacement - Percutaneous Right Transfemoral Approach             Edwards Sapien 3 THV (size 26 mm, model # 9600TFX, serial # [id number]) by Dr. Cornelius Moras on 07/04/2018.  History of Presenting Illness: Patient is an 85 year old morbidly obese male with history of aortic stenosis, type 2 diabetes mellitus on insulin, GE reflux disease with hiatal hernia, cholelithiasis, diverticulosis, ventral incisional hernia, hypertension, OSA on CPAP, peripheral arterial disease, and remote history of previous aortobiiliac grafting for abdominal aortic aneurysm who has been referred for surgical consultation to discuss treatment options for management of severe symptomatic aortic stenosis.  Patient is morbidly obese and has been very sedentary for many years.  He has very limited physical activity.  He admits to a long history of exertional shortness of breath.  He has had off-and-on problems with abdominal pain and nausea for the past year and a half.  Gallbladder ultrasound revealed cholelithiasis without evidence of cholecystitis.  Upper GI barium contrast study was unrevealing.  The patient eventually underwent a gallbladder  nuclear scan which revealed a low gallbladder ejection fraction.  The possibility of elective cholecystectomy was considered.  Because of the presence of a heart murmur on physical exam and atypical symptoms of chest pain patient was referred for cardiology clearance.  The patient had previously seen Dr. Swaziland in the remote past and underwent a stress Myoview exam in 2014 that was felt to be low risk.  Echocardiogram performed at that time revealed mild aortic stenosis with normal left ventricular systolic function.  Patient was seen in follow-up by Dr. Swaziland on May 26, 2018 at the request of the patient's primary care physician for preoperative clearance for possible cholecystectomy.  Follow-up echocardiogram performed May 30, 2018 revealed severe aortic stenosis with preserved left ventricular systolic function.  Peak velocity across aortic valve measured 4.1 m/s corresponding to mean transvalvular gradient estimated 42 mmHg.  The DVI was notably quite low at 0.17 with aortic valve area calculated only 0.54 cm.  Left ventricular ejection fraction was estimated 55 to 60%.  Diagnostic cardiac catheterization performed June 13, 2018 revealed mild nonobstructive coronary artery disease with exception of 80% stenosis of her RV marginal branch.  Catheterization confirmed the presence of severe aortic stenosis with peak to peak and mean transvalvular gradients measured 66 and 45 mmHg, respectively.  Aortic valve area was calculated 0.9 cm.  There was normal right-sided pressures and normal cardiac output.  The patient was referred to the multidisciplinary heart valve clinic and has been evaluated previously by Dr. Clifton James.  CT angiography was performed and cardiothoracic surgical consultation requested.  Patient is married and lives locally in [place]  with his wife.  He has 4 adult children, and 1 of his sons accompanies him for his office consultation visit today.  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  He lives a very sedentary lifestyle.  He has significant degenerative arthritis and uses a cane or walker for ambulation.  He does not get around much.  He has been morbidly obese for much of his adult life.  He describes stable symptoms of exertional shortness of breath that occur with moderate level activity.  He denies any resting shortness of breath, PND, or orthopnea.  He has not had any exertional chest pain or chest tightness.  He describes chronic abdominal pain that has been going on for more than a year.  He states that presently his pain is dull and located in the mid upper abdomen.  Pain is not related to meals and does not seem to wax or wane.  It is not described as crampy as nature.  He has not had any fevers or chills.  He is aware of his ventral hernia but he denies any history of symptoms suggestive of incarceration.  He does not has problems with chronic constipation.  He has not had lower abdominal pain or any recent history of fevers or chills.  Denies any history of hematochezia, hematemesis, or melena.  Brief Hospital Course:  Patient remained afebrile and hemodynamically stable.  Both groin wounds remained clean, dry, no sign of active bleeding. On post op EKG, he had a new LBBB. He was not on a beta blocker or home medications of Amlodipine and Losartan secondary to labile blood pressure and elevated creatinine. His creatinine went as high as 2.65. Last creatinine was down to 2.1.7. He was started on a baby ecasa and Plavix 75 mg daily. He has a history of diabetes so he was restarted on Levemir. Actos will be restarted at discharge. Post op echo was done 01/22 and showed LVEF 55-60%, no AS and mild AI (peak gradient 21 mm Hg) and trivial MR. He became more anemic. He was transfused 2 PRBCs on 01/23 as his H and H decreased to 7.5 and 23.4. CT of abdomen and pelvis showed ill defined hemorrhage in left groin but no  retroperitoneal continuation and cirrhosis, cholelithiasis, and midline hernias containing fat and bowel which may contribute to abdominal distension. His last H and H this am is up to 9.1 and 28. His left groin hematoma is stable. He has ecchymosis of the perineum, penis left groin and lower left abdomen. He later became hypertensive so Amlodipine was restarted but not Losartan secondary to elevated creatinine. A follow up appointment has been arranged. In addition, patient needs a CBC (anemia, left groin hematoma) and BMET (elevated creatinine). Carlean Jews PA-C made aware of labs that need to be checked. He is felt surgically stable for discharge today.   Latest Vital Signs: Blood pressure (!) 150/58, pulse 88, temperature 97.9 F (36.6 C), temperature source Oral, resp. rate 18, height 5\' 10"  (1.778 m), weight 112 kg, SpO2 97 %.  Physical Exam: Cardiovascular: RRR, no murmur Pulmonary: Clear to auscultation bilaterally. Abdomen: Soft, non tender, obese, bowel sounds present. Extremities: Trace bilateral lower extremity edema. Feet warm and Doppler pulses intact bilaterally Wounds: Swelling and ++ ecchymosis perineum and penis, left groin, lower abdomen. Right groin is clean and dry-hematoma appears stable     Discharge Condition: Stable and discharged to home.  Recent laboratory studies:  Lab Results  Component Value Date   WBC 7.4 07/07/2018   HGB 9.1 (L) 07/07/2018   HCT 28.0 (L) 07/07/2018   MCV 100.4 (H) 07/07/2018   PLT 61 (L) 07/07/2018   Lab Results  Component Value Date   NA 140 07/07/2018   K 3.9 07/07/2018   CL 111 07/07/2018   CO2 22 07/07/2018   CREATININE 2.17 (H) 07/07/2018   GLUCOSE 91 07/07/2018    Diagnostic Studies: Ct Abdomen Pelvis Wo Contrast  Result Date: 07/06/2018 CLINICAL DATA:  Abdominal distention after TAVR. Evaluate for retroperitoneal hemorrhage. EXAM: CT ABDOMEN AND PELVIS WITHOUT CONTRAST TECHNIQUE: Multidetector CT imaging of the abdomen  and pelvis was performed following the standard protocol without IV contrast. COMPARISON:  06/19/2018 FINDINGS: Lower chest:  No acute finding Hepatobiliary: Lobulated liver and large fissures, possible cirrhosis.Cholelithiasis without inflammatory changes or common bile duct dilatation. Pancreas: Unremarkable. Spleen: Granulomatous type calcifications. Adrenals/Urinary Tract: Negative adrenals. No hydronephrosis or stone. Bilateral renal cystic densities. Bilateral renal cortical thinning. Unremarkable bladder. Stomach/Bowel:  No obstruction. No bowel inflammation. Vascular/Lymphatic: Recent TAVR. There is ill-defined hemorrhage in the left groin which is incompletely covered. The largest pocket measures 4.3 x 2 cm on axial slices. No retroperitoneal continuation. Extensive atherosclerotic calcification. No mass or adenopathy. Reproductive:Negative Other: No ascites or pneumoperitoneum. Two midline abdominal wall hernias containing fat and bowel (superiorly the transverse colon inferiorly small bowel). Musculoskeletal: No acute finding severe and diffuse degenerative disease. IMPRESSION: 1. Ill-defined hemorrhage in the postoperative left groin with the largest discrete pocket measuring 4 x 2 cm on axial slices. No retroperitoneal extension to explain history of abdominal distention. 2. Midline hernias containing fat and bowel which may contribute to abdominal distension. 3. Cirrhosis. 4. Cholelithiasis. Electronically Signed   By: Marnee Spring M.D.   On: 07/06/2018 06:19   Dg Chest 2 View  Result Date: 07/03/2018 CLINICAL DATA:  Pre-admission cardiac surgery. EXAM: CHEST - 2 VIEW COMPARISON:  Chest radiograph 10/10/2012 FINDINGS: Stable cardiac and mediastinal contours. Tortuosity of the thoracic aorta. Unchanged partially calcified granuloma right lower hemithorax. No pleural effusion or pneumothorax. Thoracic spine degenerative changes. IMPRESSION: No acute cardiopulmonary process. Electronically Signed    By: Annia Belt M.D.   On: 07/03/2018 09:41   Ct Coronary Morph W/cta Cor W/score W/ca W/cm &/or Wo/cm  Addendum Date: 06/19/2018   ADDENDUM REPORT: 06/19/2018 15:56 CLINICAL DATA:  Aortic stenosis EXAM: Cardiac TAVR CT TECHNIQUE: The patient was scanned on a Siemens Force 192 slice scanner. A 120 kV retrospective scan was triggered in the descending thoracic aorta at 111 HU's. Gantry rotation speed was 270 msecs and collimation was .9 mm. No beta blockade or nitro were given. The 3D data set was reconstructed in 5% intervals of the R-R cycle. Systolic and diastolic phases were analyzed on a dedicated work station using MPR, MIP and VRT modes. The patient received 80 cc of contrast. FINDINGS: Aortic Valve: Tri leaflet and calcified with restricted leaflet motion Aorta: Moderate calcific atherosclerosis with bovine arch no aneurysm Sinotubular Junction: 30 mm Ascending Thoracic Aorta: 34 mm Aortic Arch: 28 mm Descending Thoracic Aorta: 28 mm Sinus of Valsalva Measurements: Non-coronary: 31.5 mm Right - coronary: 30.2 mm Left - coronary: 31 mm Coronary Artery Height above Annulus: Left Main: 15.8 mm above annulus Right Coronary: 15.6 mm above annulus Virtual Basal Annulus Measurements: Maximum/Minimum Diameter: 27 mm x 23.3 mm Perimeter: 82 mm Area: 503 mm2 Coronary Arteries: Sufficient height above annulus for deployment Optimum Fluoroscopic Angle for Delivery: LAO 11 Caudal 7  degrees IMPRESSION: 1. Tri leaflet AV with annulus of 503 mm 2 suitable for a 26 mm Sapien 3 valve 2.  Coronary arteries sufficient height above annulus for deployment 3. Optimum angiographic angle for deployment LAO 11 Caudal 7 degrees 4.  No LAA thrombus 5. Bovine arch with normal aortic root diameter moderate calcific aortic atherosclerosis Charlton HawsPeter Nishan Electronically Signed   By: Charlton HawsPeter  Nishan M.D.   On: 06/19/2018 15:56   Result Date: 06/19/2018 EXAM: OVER-READ INTERPRETATION  CT CHEST The following report is an over-read performed by  radiologist Dr. Trudie Reedaniel Entrikin of [facility] on 06/19/2018. This over-read does not include interpretation of cardiac or coronary anatomy or pathology. The coronary calcium score/coronary CTA interpretation by the cardiologist is attached. COMPARISON:  None. FINDINGS: Extracardiac findings will be described separately under dictation for contemporaneously obtained CTA chest, abdomen and pelvis. IMPRESSION: Please see separate dictation for contemporaneously obtained CTA chest, abdomen and pelvis dated 06/19/2017 for full description of relevant extracardiac findings. Electronically Signed: By: Trudie Reedaniel  Entrikin M.D. On: 06/19/2018 14:19   Dg Chest Port 1 View  Result Date: 07/04/2018 CLINICAL DATA:  Status post cath lab procedure. Transcatheter aortic valve replacement. EXAM: PORTABLE CHEST 1 VIEW COMPARISON:  07/03/2018 FINDINGS: There is a right IJ catheter with tip in the projection of the SVC. Heart size appears normal. Small left pleural effusion is unchanged. No right pleural effusion. No interstitial edema or airspace opacities. Calcified granuloma in the right lower lobe is again noted. IMPRESSION: 1. Small left pleural effusion, unchanged.  No new findings. Electronically Signed   By: Signa Kellaylor  Stroud M.D.   On: 07/04/2018 16:35   Ct Angio Chest Aorta W &/or Wo Contrast  Result Date: 06/28/2018 CLINICAL DATA:  85 year old male with history of severe aortic stenosis. Preprocedural study prior to potential transcatheter aortic valve replacement (TAVR) procedure. EXAM: CT ANGIOGRAPHY CHEST, ABDOMEN AND PELVIS TECHNIQUE: Multidetector CT imaging through the chest, abdomen and pelvis was performed using the standard protocol during bolus administration of intravenous contrast. Multiplanar reconstructed images and MIPs were obtained and reviewed to evaluate the vascular anatomy. CONTRAST:  100mL ISOVUE-370 IOPAMIDOL (ISOVUE-370) INJECTION 76% COMPARISON:  CTA of the chest, abdomen and pelvis  06/19/2018. FINDINGS: CTA CHEST FINDINGS Cardiovascular: Heart size is normal. There is no significant pericardial fluid, thickening or pericardial calcification. There is aortic atherosclerosis, as well as atherosclerosis of the great vessels of the mediastinum and the coronary arteries, including calcified atherosclerotic plaque in the left main, left anterior descending, left circumflex and right coronary arteries. Severe thickening calcification of the aortic valve. Mediastinum/Lymph Nodes: No pathologically enlarged mediastinal or hilar lymph nodes. Small hiatal hernia. No axillary lymphadenopathy. Lungs/Pleura: Calcified granulomas are again noted throughout the right lung, largest of which measures up to 1.8 cm in the right lower lobe. No other suspicious appearing pulmonary nodules or masses are noted. No acute consolidative airspace disease. No pleural effusions. Musculoskeletal/Soft Tissues: There are no aggressive appearing lytic or blastic lesions noted in the visualized portions of the skeleton. CTA ABDOMEN AND PELVIS FINDINGS Hepatobiliary: Liver again has a shrunken appearance and nodular contour, compatible with underlying cirrhosis. No suspicious cystic or solid hepatic lesions. No intra or extrahepatic biliary ductal dilatation. Tiny calcified gallstones lie dependently in the gallbladder. No surrounding inflammatory changes to suggest an acute cholecystitis at this time. Pancreas: No pancreatic mass. No pancreatic ductal dilatation. No pancreatic or peripancreatic fluid or inflammatory changes. Spleen: Unremarkable. Adrenals/Urinary Tract: Atrophy of both kidneys. Low-attenuation lesions in both kidneys, compatible  with simple cysts, largest of which measures up to 5.5 cm in diameter in the interpolar region of the right kidney. No hydroureteronephrosis. Urinary bladder is nearly decompressed, but otherwise unremarkable in appearance. Bilateral adrenal glands are normal in appearance.  Stomach/Bowel: Normal appearance of the stomach. No pathologic dilatation of small bowel or colon. Short segment of mid transverse colon extends into an epigastric ventral hernia. A few scattered colonic diverticulae are noted, without surrounding inflammatory changes to suggest an acute diverticulitis at this time. Specifically, the inflammation noted adjacent to the proximal descending colon seen on the prior study has resolved. The appendix is not confidently identified and may be surgically absent. Regardless, there are no inflammatory changes noted adjacent to the cecum to suggest the presence of an acute appendicitis at this time. Vascular/Lymphatic: Aortic atherosclerosis with postoperative changes of aorto bi-iliac bypass graft which is widely patent. Vascular findings and measurements pertinent to potential TAVR procedure, as detailed below. 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 No lymphadenopathy noted in the abdomen or pelvis. Reproductive: Prostate gland and seminal vesicles are unremarkable in appearance. Other: Small epigastric ventral hernia containing a short segment of the mid transverse colon. There is also a small infraumbilical ventral hernia containing some omental fat and short segment of mid small bowel. No significant volume of ascites. No pneumoperitoneum. Musculoskeletal: There are no aggressive appearing lytic or blastic lesions noted in the visualized portions of the skeleton. VASCULAR MEASUREMENTS PERTINENT TO TAVR: AORTA: Minimal Aortic Diameter-19 x 18 mm Severity of Aortic Calcification-mild-to-moderate RIGHT PELVIS: Right Common Iliac Artery - Minimal Diameter-11.6 x 10.3 mm Tortuosity-mild Calcification-moderate Right External Iliac Artery - Minimal Diameter-4.0 x 3.3 mm Tortuosity-moderate to severe Calcification-moderate Right Common Femoral Artery -  Minimal Diameter-8.1 x 7.0 mm Tortuosity-mild Calcification-mild-to-moderate LEFT PELVIS: Left Common Iliac Artery - Minimal Diameter-9.8 x 8.8 mm Tortuosity-mild Calcification-mild Left External Iliac Artery - Minimal Diameter-5.7 x 5.7 mm Tortuosity-moderate to severe Calcification-moderate Left Common Femoral Artery - Minimal Diameter-6.8 x 6.8 mm Tortuosity-mild Calcification-moderate Comment- aneurysmal dilatation up to 16 x 19 mm with large eccentric atheromatous plaque. Review of the MIP images confirms the above findings. IMPRESSION: 1. Vascular findings and measurements pertinent to potential TAVR procedure, as detailed above. 2. Severe thickening calcification of the aortic valve, compatible with the reported clinical history of severe aortic stenosis. 3. Resolution of inflammatory changes noted adjacent to the proximal descending colon seen on the prior study, suggesting a resolved diverticulitis. Extensive colonic diverticulosis is again noted. 4. Epigastric ventral hernia containing short segment of mid transverse colon, and infraumbilical ventral hernia containing some omental fat and short segment of mid small bowel without evidence of bowel incarceration or obstruction at this time. 5. Cirrhosis. 6. Cholelithiasis without evidence of acute cholecystitis at this time. 7. Additional incidental findings, as above. Electronically Signed   By: Trudie Reedaniel  Entrikin M.D.   On: 06/28/2018 09:02   Ct Angio Chest Aorta W &/or Wo Contrast  Result Date: 06/19/2018 CLINICAL DATA:  85 year old male with history of severe aortic stenosis. Preprocedural study prior to potential transcatheter aortic valve replacement (TAVR) procedure. EXAM: CT ANGIOGRAPHY CHEST, ABDOMEN AND PELVIS TECHNIQUE: Multidetector CT imaging through the chest, abdomen and pelvis was performed using the standard protocol during bolus administration of intravenous contrast. Multiplanar reconstructed images and MIPs were obtained and reviewed to  evaluate the vascular anatomy. CONTRAST:  100mL ISOVUE-370 IOPAMIDOL (ISOVUE-370) INJECTION  76% COMPARISON:  None. FINDINGS: CTA CHEST FINDINGS Cardiovascular: Heart size is normal. There is no significant pericardial fluid, thickening or pericardial calcification. There is aortic atherosclerosis, as well as atherosclerosis of the great vessels of the mediastinum and the coronary arteries, including calcified atherosclerotic plaque in the left main, left anterior descending, left circumflex and right coronary arteries. Severe calcifications of the aortic valve. Mediastinum/Lymph Nodes: No pathologically enlarged mediastinal or hilar lymph nodes. Small hiatal hernia. No axillary lymphadenopathy. Lungs/Pleura: 1.8 cm calcified granuloma in the right lower lobe. Several other smaller calcified granulomas are also noted in the right lung. No other suspicious appearing pulmonary nodules or masses are noted. No acute consolidative airspace disease. No pleural effusions. Musculoskeletal/Soft Tissues: There are no aggressive appearing lytic or blastic lesions noted in the visualized portions of the skeleton. CTA ABDOMEN AND PELVIS FINDINGS Hepatobiliary: Liver has a slightly shrunken appearance and nodular contour, indicative of underlying cirrhosis. No discrete cystic or solid hepatic lesions. No intra or extrahepatic biliary ductal dilatation. Small calcified gallstones lying dependently in the neck of the gallbladder. No findings to suggest an acute cholecystitis at this time. Pancreas: No pancreatic mass. No pancreatic ductal dilatation. No pancreatic or peripancreatic fluid or inflammatory changes. Spleen: Numerous calcified granulomas in the spleen. Adrenals/Urinary Tract: Low-attenuation lesions in the kidneys bilaterally, compatible with simple cysts, largest of which measures 5.2 cm in the interpolar region of the right kidney. Subcentimeter low-attenuation lesion in the lower pole the right kidney, too small to  characterize, but statistically likely to represent a tiny cyst. Urothelial enhancement in the right renal pelvis. No hydroureteronephrosis. Urinary bladder is normal in appearance. Bilateral adrenal glands are normal in appearance. Stomach/Bowel: Normal appearance of the stomach. No pathologic dilatation of small bowel or colon. Numerous colonic diverticulae are noted, with some inflammatory changes adjacent to a diverticulum in the descending colon (axial image 108 of series 15), which could indicate an acute diverticulitis. There is a short segment of the mid transverse colon which extends into an epigastric ventral hernia. The appendix is not confidently identified and may be surgically absent. Regardless, there are no inflammatory changes noted adjacent to the cecum to suggest the presence of an acute appendicitis at this time. Vascular/Lymphatic: Aortic atherosclerosis. Postoperative changes of aorto bi-iliac graft placement. Today's study is considered nondiagnostic from a CTA perspective secondary to suboptimal contrast bolus. No lymphadenopathy noted in the abdomen or pelvis. Reproductive: Prostate gland and seminal vesicles are unremarkable in appearance. Other: Moderate-sized epigastric ventral hernia containing a short segment of the mid transverse colon. There is also a smaller supraumbilical ventral hernia containing only omental fat immediately above the level of the umbilicus. No significant volume of ascites. No pneumoperitoneum. Musculoskeletal: There are no aggressive appearing lytic or blastic lesions noted in the visualized portions of the skeleton. VASCULAR MEASUREMENTS PERTINENT TO TAVR: AORTA: Minimal Aortic Diameter-18 x 19 mm Severity of Aortic Calcification-moderate Nondiagnostic assessment of pelvic vasculature secondary to suboptimal contrast bolus. IMPRESSION: 1. Today's study is unfortunately nondiagnostic in terms of CTA assessment of the pelvic vasculature for upcoming TAVR  procedure. Repeat CTA of the abdomen and pelvis is recommended prior to the procedure for full assessment of the pelvic arterial access vessels. 2. Severe calcifications of the aortic valve, compatible with the reported clinical history of severe aortic stenosis. 3. Colonic diverticulosis with inflammatory changes adjacent to the descending colon, concerning for an acute diverticulitis. Further clinical evaluation is recommended. 4. Cholelithiasis without evidence of acute cholecystitis at this time. 5. Two  ventral hernias, one of which in epigastric region contains a short segment of the mid transverse colon. No findings to suggest acute bowel incarceration or obstruction at this time. 6. Aortic atherosclerosis, in addition to left main and 3 vessel coronary artery disease. 7. Additional incidental findings, as above. These results will be called to the ordering clinician or representative by the Radiologist Assistant, and communication documented in the PACS or zVision Dashboard. Electronically Signed   By: Trudie Reed M.D.   On: 06/19/2018 14:51   Vas US Carotid  Result Date: 06/27/2018 Carotid Arterial Duplex Study Indications: Pre TAVR. Performing Technologist: Jeb Levering RDMS, RVT  Examination Guidelines: A complete evaluation includes B-mode imaging, spectral Doppler, color Doppler, and power Doppler as needed of all accessible portions of each vessel. Bilateral testing is considered an integral part of a complete examination. Limited examinations for reoccurring indications may be performed as noted.  Right Carotid Findings: +----------+--------+--------+--------+---------------------+------------------+           PSV cm/sEDV cm/sStenosisDescribe             Comments           +----------+--------+--------+--------+---------------------+------------------+ CCA Prox  76      15                                                       +----------+--------+--------+--------+---------------------+------------------+ CCA Distal67      14                                                      +----------+--------+--------+--------+---------------------+------------------+ ICA Prox  320     97      60-79%  heterogenous,        HIGH END OF RANGE                                    calcific and         (systolic is in                                      irregular            80-99% range)      +----------+--------+--------+--------+---------------------+------------------+ ICA Mid   196     50                                                      +----------+--------+--------+--------+---------------------+------------------+ ICA Distal81      19                                                      +----------+--------+--------+--------+---------------------+------------------+ ECA       66      4                                                       +----------+--------+--------+--------+---------------------+------------------+ +----------+--------+-------+----------------+-------------------+  PSV cm/sEDV cmsDescribe        Arm Pressure (mmHG) +----------+--------+-------+----------------+-------------------+ Subclavian117            Multiphasic, WNL                    +----------+--------+-------+----------------+-------------------+ +---------+--------+--+--------+--+---------+ VertebralPSV cm/s77EDV cm/s21Antegrade +---------+--------+--+--------+--+---------+  Left Carotid Findings: +----------+--------+--------+--------+------------+--------+           PSV cm/sEDV cm/sStenosisDescribe    Comments +----------+--------+--------+--------+------------+--------+ CCA Prox  119     26                                   +----------+--------+--------+--------+------------+--------+ CCA Distal117     20                                    +----------+--------+--------+--------+------------+--------+ ICA Prox  148     48      40-59%  heterogenous         +----------+--------+--------+--------+------------+--------+ ICA Mid   146     43                                   +----------+--------+--------+--------+------------+--------+ ICA Distal138     40                                   +----------+--------+--------+--------+------------+--------+ ECA       105     9                                    +----------+--------+--------+--------+------------+--------+ +----------+--------+--------+----------------+-------------------+ SubclavianPSV cm/sEDV cm/sDescribe        Arm Pressure (mmHG) +----------+--------+--------+----------------+-------------------+           169             Multiphasic, WNL                    +----------+--------+--------+----------------+-------------------+ +---------+--------+--+--------+--+---------+ VertebralPSV cm/s90EDV cm/s27Antegrade +---------+--------+--+--------+--+---------+  Summary: Right Carotid: Velocities in the right ICA are consistent with a 60-79%                stenosis. Left Carotid: Velocities in the left ICA are consistent with a 40-59% stenosis.  *See table(s) above for measurements and observations.  Electronically signed by Coral Else MD on 06/27/2018 at 6:32:30 PM.    Final    Ct Angio Abd/pel W/ And/or W/o  Result Date: 06/28/2018 CLINICAL DATA:  85 year old male with history of severe aortic stenosis. Preprocedural study prior to potential transcatheter aortic valve replacement (TAVR) procedure. EXAM: CT ANGIOGRAPHY CHEST, ABDOMEN AND PELVIS TECHNIQUE: Multidetector CT imaging through the chest, abdomen and pelvis was performed using the standard protocol during bolus administration of intravenous contrast. Multiplanar reconstructed images and MIPs were obtained and reviewed to evaluate the vascular anatomy. CONTRAST:  ISOVUE-370 IOPAMIDOL  (ISOVUE-370) INJECTION 76% COMPARISON:  CTA of the chest, abdomen and pelvis 06/19/2018. FINDINGS: CTA CHEST FINDINGS Cardiovascular: Heart size is normal. There is no significant pericardial fluid, thickening or pericardial calcification. There is aortic atherosclerosis, as well as atherosclerosis of the great vessels of the mediastinum and the coronary arteries, including calcified atherosclerotic plaque in the left main,  left anterior descending, left circumflex and right coronary arteries. Severe thickening calcification of the aortic valve. Mediastinum/Lymph Nodes: No pathologically enlarged mediastinal or hilar lymph nodes. Small hiatal hernia. No axillary lymphadenopathy. Lungs/Pleura: Calcified granulomas are again noted throughout the right lung, largest of which measures up to 1.8 cm in the right lower lobe. No other suspicious appearing pulmonary nodules or masses are noted. No acute consolidative airspace disease. No pleural effusions. Musculoskeletal/Soft Tissues: There are no aggressive appearing lytic or blastic lesions noted in the visualized portions of the skeleton. CTA ABDOMEN AND PELVIS FINDINGS Hepatobiliary: Liver again has a shrunken appearance and nodular contour, compatible with underlying cirrhosis. No suspicious cystic or solid hepatic lesions. No intra or extrahepatic biliary ductal dilatation. Tiny calcified gallstones lie dependently in the gallbladder. No surrounding inflammatory changes to suggest an acute cholecystitis at this time. Pancreas: No pancreatic mass. No pancreatic ductal dilatation. No pancreatic or peripancreatic fluid or inflammatory changes. Spleen: Unremarkable. Adrenals/Urinary Tract: Atrophy of both kidneys. Low-attenuation lesions in both kidneys, compatible with simple cysts, largest of which measures up to 5.5 cm in diameter in the interpolar region of the right kidney. No hydroureteronephrosis. Urinary bladder is nearly decompressed, but otherwise unremarkable  in appearance. Bilateral adrenal glands are normal in appearance. Stomach/Bowel: Normal appearance of the stomach. No pathologic dilatation of small bowel or colon. Short segment of mid transverse colon extends into an epigastric ventral hernia. A few scattered colonic diverticulae are noted, without surrounding inflammatory changes to suggest an acute diverticulitis at this time. Specifically, the inflammation noted adjacent to the proximal descending colon seen on the prior study has resolved. The appendix is not confidently identified and may be surgically absent. Regardless, there are no inflammatory changes noted adjacent to the cecum to suggest the presence of an acute appendicitis at this time. Vascular/Lymphatic: Aortic atherosclerosis with postoperative changes of aorto bi-iliac bypass graft which is widely patent. Vascular findings and measurements pertinent to potential TAVR procedure, as detailed below. 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 No lymphadenopathy noted in the abdomen or pelvis. Reproductive: Prostate gland and seminal vesicles are unremarkable in appearance. Other: Small epigastric ventral hernia containing a short segment of the mid transverse colon. There is also a small infraumbilical ventral hernia containing some omental fat and short segment of mid small bowel. No significant volume of ascites. No pneumoperitoneum. Musculoskeletal: There are no aggressive appearing lytic or blastic lesions noted in the visualized portions of the skeleton. VASCULAR MEASUREMENTS PERTINENT TO TAVR: AORTA: Minimal Aortic Diameter-19 x 18 mm Severity of Aortic Calcification-mild-to-moderate RIGHT PELVIS: Right Common Iliac Artery - Minimal Diameter-11.6 x 10.3 mm Tortuosity-mild Calcification-moderate Right External Iliac Artery - Minimal Diameter-4.0 x 3.3 mm Tortuosity-moderate to  severe Calcification-moderate Right Common Femoral Artery - Minimal Diameter-8.1 x 7.0 mm Tortuosity-mild Calcification-mild-to-moderate LEFT PELVIS: Left Common Iliac Artery - Minimal Diameter-9.8 x 8.8 mm Tortuosity-mild Calcification-mild Left External Iliac Artery - Minimal Diameter-5.7 x 5.7 mm Tortuosity-moderate to severe Calcification-moderate Left Common Femoral Artery - Minimal Diameter-6.8 x 6.8 mm Tortuosity-mild Calcification-moderate Comment- aneurysmal dilatation up to 16 x 19 mm with large eccentric atheromatous plaque. Review of the MIP images confirms the above findings. IMPRESSION: 1. Vascular findings and measurements pertinent to potential TAVR procedure, as detailed above. 2. Severe thickening calcification of the aortic valve, compatible with the reported clinical history of severe aortic stenosis. 3. Resolution of inflammatory changes noted adjacent  to the proximal descending colon seen on the prior study, suggesting a resolved diverticulitis. Extensive colonic diverticulosis is again noted. 4. Epigastric ventral hernia containing short segment of mid transverse colon, and infraumbilical ventral hernia containing some omental fat and short segment of mid small bowel without evidence of bowel incarceration or obstruction at this time. 5. Cirrhosis. 6. Cholelithiasis without evidence of acute cholecystitis at this time. 7. Additional incidental findings, as above. Electronically Signed   By: Trudie Reed M.D.   On: 06/28/2018 09:02   Ct Angio Abd/pel W/ And/or W/o  Result Date: 06/19/2018 CLINICAL DATA:  85 year old male with history of severe aortic stenosis. Preprocedural study prior to potential transcatheter aortic valve replacement (TAVR) procedure. EXAM: CT ANGIOGRAPHY CHEST, ABDOMEN AND PELVIS TECHNIQUE: Multidetector CT imaging through the chest, abdomen and pelvis was performed using the standard protocol during bolus administration of intravenous contrast. Multiplanar  reconstructed images and MIPs were obtained and reviewed to evaluate the vascular anatomy. CONTRAST:  ISOVUE-370 IOPAMIDOL (ISOVUE-370) INJECTION 76% COMPARISON:  None. FINDINGS: CTA CHEST FINDINGS Cardiovascular: Heart size is normal. There is no significant pericardial fluid, thickening or pericardial calcification. There is aortic atherosclerosis, as well as atherosclerosis of the great vessels of the mediastinum and the coronary arteries, including calcified atherosclerotic plaque in the left main, left anterior descending, left circumflex and right coronary arteries. Severe calcifications of the aortic valve. Mediastinum/Lymph Nodes: No pathologically enlarged mediastinal or hilar lymph nodes. Small hiatal hernia. No axillary lymphadenopathy. Lungs/Pleura: 1.8 cm calcified granuloma in the right lower lobe. Several other smaller calcified granulomas are also noted in the right lung. No other suspicious appearing pulmonary nodules or masses are noted. No acute consolidative airspace disease. No pleural effusions. Musculoskeletal/Soft Tissues: There are no aggressive appearing lytic or blastic lesions noted in the visualized portions of the skeleton. CTA ABDOMEN AND PELVIS FINDINGS Hepatobiliary: Liver has a slightly shrunken appearance and nodular contour, indicative of underlying cirrhosis. No discrete cystic or solid hepatic lesions. No intra or extrahepatic biliary ductal dilatation. Small calcified gallstones lying dependently in the neck of the gallbladder. No findings to suggest an acute cholecystitis at this time. Pancreas: No pancreatic mass. No pancreatic ductal dilatation. No pancreatic or peripancreatic fluid or inflammatory changes. Spleen: Numerous calcified granulomas in the spleen. Adrenals/Urinary Tract: Low-attenuation lesions in the kidneys bilaterally, compatible with simple cysts, largest of which measures 5.2 cm in the interpolar region of the right kidney. Subcentimeter  low-attenuation lesion in the lower pole the right kidney, too small to characterize, but statistically likely to represent a tiny cyst. Urothelial enhancement in the right renal pelvis. No hydroureteronephrosis. Urinary bladder is normal in appearance. Bilateral adrenal glands are normal in appearance. Stomach/Bowel: Normal appearance of the stomach. No pathologic dilatation of small bowel or colon. Numerous colonic diverticulae are noted, with some inflammatory changes adjacent to a diverticulum in the descending colon (axial image 108 of series 15), which could indicate an acute diverticulitis. There is a short segment of the mid transverse colon which extends into an epigastric ventral hernia. The appendix is not confidently identified and may be surgically absent. Regardless, there are no inflammatory changes noted adjacent to the cecum to suggest the presence of an acute appendicitis at this time. Vascular/Lymphatic: Aortic atherosclerosis. Postoperative changes of aorto bi-iliac graft placement. Today's study is considered nondiagnostic from a CTA perspective secondary to suboptimal contrast bolus. No lymphadenopathy noted in the abdomen or pelvis. Reproductive: Prostate gland and seminal vesicles are unremarkable in appearance. Other: Moderate-sized epigastric ventral  hernia containing a short segment of the mid transverse colon. There is also a smaller supraumbilical ventral hernia containing only omental fat immediately above the level of the umbilicus. No significant volume of ascites. No pneumoperitoneum. Musculoskeletal: There are no aggressive appearing lytic or blastic lesions noted in the visualized portions of the skeleton. VASCULAR MEASUREMENTS PERTINENT TO TAVR: AORTA: Minimal Aortic Diameter-18 x 19 mm Severity of Aortic Calcification-moderate Nondiagnostic assessment of pelvic vasculature secondary to suboptimal contrast bolus. IMPRESSION: 1. Today's study is unfortunately nondiagnostic in terms  of CTA assessment of the pelvic vasculature for upcoming TAVR procedure. Repeat CTA of the abdomen and pelvis is recommended prior to the procedure for full assessment of the pelvic arterial access vessels. 2. Severe calcifications of the aortic valve, compatible with the reported clinical history of severe aortic stenosis. 3. Colonic diverticulosis with inflammatory changes adjacent to the descending colon, concerning for an acute diverticulitis. Further clinical evaluation is recommended. 4. Cholelithiasis without evidence of acute cholecystitis at this time. 5. Two ventral hernias, one of which in epigastric region contains a short segment of the mid transverse colon. No findings to suggest acute bowel incarceration or obstruction at this time. 6. Aortic atherosclerosis, in addition to left main and 3 vessel coronary artery disease. 7. Additional incidental findings, as above. These results will be called to the ordering clinician or representative by the Radiologist Assistant, and communication documented in the PACS or zVision Dashboard. Electronically Signed   By: Trudie Reed M.D.   On: 06/19/2018 14:51    Discharge Medications: Allergies as of 07/07/2018      Reactions   Indocin [indomethacin] Other (See Comments)   Antiinflammatory medication for gout ? Indocin > black out per patient ? SYNCOPE ?      Medication List    STOP taking these medications   losartan 100 MG tablet Commonly known as:  COZAAR     TAKE these medications   acetaminophen 325 MG tablet Commonly known as:  TYLENOL Take 2 tablets (650 mg total) by mouth every 6 (six) hours as needed for mild pain.   allopurinol 300 MG tablet Commonly known as:  ZYLOPRIM Take 300 mg by mouth daily.   amLODipine 5 MG tablet Commonly known as:  NORVASC Take 5 mg by mouth daily.   aspirin 81 MG tablet Take 81 mg by mouth daily.   carboxymethylcellulose 0.5 % Soln Commonly known as:  REFRESH PLUS Place 1 drop into both  eyes daily as needed (dry eyes).   clopidogrel 75 MG tablet Commonly known as:  PLAVIX Take 1 tablet (75 mg total) by mouth daily with breakfast.   dorzolamide-timolol 22.3-6.8 MG/ML ophthalmic solution Commonly known as:  COSOPT Place 1 drop into the left eye 2 (two) times daily.   hydrocortisone cream 1 % Apply 1 application topically daily as needed for itching.   insulin detemir 100 UNIT/ML injection Commonly known as:  LEVEMIR Inject 34 Units into the skin every morning.   multivitamin with minerals Tabs tablet Take 2 tablets by mouth daily.   mupirocin ointment 2 % Commonly known as:  BACTROBAN Place 1 application into the nose 2 (two) times daily for 4 days.   pioglitazone 15 MG tablet Commonly known as:  ACTOS Take 15 mg by mouth daily.   simvastatin 80 MG tablet Commonly known as:  ZOCOR Take 80 mg by mouth daily.   traMADol 50 MG tablet Commonly known as:  ULTRAM Take 1 tablet (50 mg total) by mouth every 6 (  six) hours as needed for moderate pain.      The patient has been discharged on:   1.Beta Blocker:  Yes [   ]                              No   [  x ]                              If No, reason:Bradycardia  2.Ace Inhibitor/ARB: Yes [   ]                                     No  [  x  ]                                     If No, reason:Labile BP  3.Statin:   Yes [   ]                  No  [ x  ]                  If No, reason:No CAD  4.Marlowe Kays:  Yes  [ x  ]                  No   [   ]                  If No, reason:  Follow Up Appointments: Follow-up Information    Janetta Hora, PA-C. Go on 07/13/2018.   Specialties:  Cardiology, Radiology Why:  @ 3:30pm, please arrive at least 10 minutes early Contact information: [address] [place] [address] [phone]           Signed: Lelon Huh [facility] 07/07/2018, 8:50 AM

## 2018-07-05 NOTE — Progress Notes (Addendum)
[address]       [place] [address]             [phone]     CARDIOTHORACIC SURGERY PROGRESS NOTE  1 Day Post-Op  S/P Procedure(s) (LRB): TRANSCATHETER AORTIC VALVE REPLACEMENT, TRANSFEMORAL (N/A) TRANSESOPHAGEAL ECHOCARDIOGRAM (TEE) INTRAOPERATIVE TRANSTHORACIC ECHOCARDIOGRAM  Subjective: Only complaint is that of his chronic pain upper mid abdomen, likely related to his ventral hernia, which has been bothering him for months  Objective: Vital signs in last 24 hours: Temp:  [97.5 F (36.4 C)-98.6 F (37 C)] 98.6 F (37 C) (01/22 0755) Pulse Rate:  [52-98] 76 (01/22 0430) Cardiac Rhythm: Heart block (01/22 0721) Resp:  [0-20] 16 (01/22 0430) BP: (90-163)/(35-124) 102/49 (01/22 0430) SpO2:  [94 %-100 %] 94 % (01/22 0755) Weight:  [106.5 kg] 106.5 kg (01/22 0500)  Physical Exam:  Rhythm:   sinus  Breath sounds: clear  Heart sounds:  RRR w/out murmur  Incisions:  Both groins okay  Abdomen:  Soft, non-distended, non-tender  Extremities:  Warm, well-perfused, pulses ok   Intake/Output from previous day: 01/21 0701 - 01/22 0700 In: 3073.1 [P.O.:420; I.V.:2353.1; IV Piggyback:300] Out: 625 [Urine:600; Blood:25] Intake/Output this shift: No intake/output data recorded.  Lab Results: Recent Labs    07/03/18 0837  07/04/18 1307 07/05/18 0430  WBC 5.4  --   --  11.2*  HGB 11.7*   < > 10.9* 8.7*  HCT 38.4*   < > 32.0* 27.2*  PLT 94*  --   --  89*   < > = values in this interval not displayed.   BMET:  Recent Labs    07/03/18 0837  07/04/18 1307 07/05/18 0430  NA 140   < > 143 139  K 4.4   < > 4.5 4.5  CL 113*  --  113* 113*  CO2 16*  --   --  18*  GLUCOSE 105*   < > 147* 164*  BUN 32*  --  27* 39*  CREATININE 1.63*  --  1.50* 2.53*  CALCIUM 8.9  --   --  8.2*   < > = values in this interval not displayed.    CBG (last 3)  Recent Labs    07/05/18 0005 07/05/18 0432 07/05/18 0639  GLUCAP 219* 156* 126*   PT/INR:   Recent  Labs    07/03/18 0837  LABPROT 14.3  INR 1.12    CXR:  PORTABLE CHEST 1 VIEW  COMPARISON:  07/03/2018  FINDINGS: There is a right IJ catheter with tip in the projection of the SVC. Heart size appears normal. Small left pleural effusion is unchanged. No right pleural effusion. No interstitial edema or airspace opacities. Calcified granuloma in the right lower lobe is again noted.  IMPRESSION: 1. Small left pleural effusion, unchanged.  No new findings.   Electronically Signed   By: Signa Kell M.D.   On: 07/04/2018 16:35   EKG: NSR w/out acute ischemic changes, LBBB (new)    Assessment/Plan: S/P Procedure(s) (LRB): TRANSCATHETER AORTIC VALVE REPLACEMENT, TRANSFEMORAL (N/A) TRANSESOPHAGEAL ECHOCARDIOGRAM (TEE) INTRAOPERATIVE TRANSTHORACIC ECHOCARDIOGRAM  Stable POD1 Maintaining NSR, new LBBB BP stable but relatively low  Post op elevated serum creatinine - acute exacerbation of baseline CKD stage III, likely due to prerenal azotemia +/- acute kidney injury caused by ATN +/- contrast nephropathy, UOP adequate Expected post op acute blood loss anemia, Hgb 8.7 today - no sign of active bleeding Type II diabetes mellitus, adequate glycemic control  Hold ARB and amlodipine and watch BP  Mobilize  Routine POD1 ECHO  DAPT  Restart home dose Levemir and continue SSI  Hold Actos for now  Recheck renal function, Hgb tomorrow  Possible d/c home tomorrow   Purcell Nails, MD 07/05/2018 8:41 AM

## 2018-07-05 NOTE — Progress Notes (Signed)
1583: DR. Cornelius Moras paged.  0100: MD called back, notified pt's BP has been running low, 92/43, manual BP was 90/40. Pt is not symptomatic.  Also notified MD bilateral groin site normal without s/s of bleeding. MD asked if pt is able to void, notified pt voided around 1930 . Perineum bruised but has not changed since sift change. Received order to infuse 500 cc NS over 5 hours. Pt resting with bypap on. Will continue to monitor.

## 2018-07-05 NOTE — Progress Notes (Signed)
1100-1115 Came to see pt to walk. Pt in bathroom. Helped NT get pt to bed after washing hands. HR up to 118 with being up. Pt very tired and not up to walking right now. Will follow up after lunch. Son said pt has rollator at home.  Luetta Nutting RN BSN 07/05/2018 11:19 AM

## 2018-07-05 NOTE — Progress Notes (Signed)
Ambulated pt 200 feet with front wheel walker. Pt has been to restroom twice this am. HR went up to 120's when walking, came down to 90's as sat back down in bed. BiIateral groin site level 1. Gauze dressing removed. Pt has bilateral groin skin tear, left it OTA for day shift RN and MD to look at the site. Call bell within reach. Will continue to monitor.

## 2018-07-06 ENCOUNTER — Encounter: Payer: Self-pay | Admitting: Thoracic Surgery (Cardiothoracic Vascular Surgery)

## 2018-07-06 ENCOUNTER — Encounter (HOSPITAL_COMMUNITY): Payer: Self-pay | Admitting: Thoracic Surgery (Cardiothoracic Vascular Surgery)

## 2018-07-06 ENCOUNTER — Inpatient Hospital Stay (HOSPITAL_COMMUNITY): Payer: Medicare Other

## 2018-07-06 LAB — CBC
HCT: 23.4 % — ABNORMAL LOW (ref 39.0–52.0)
Hemoglobin: 7.5 g/dL — ABNORMAL LOW (ref 13.0–17.0)
MCH: 34.4 pg — ABNORMAL HIGH (ref 26.0–34.0)
MCHC: 32.1 g/dL (ref 30.0–36.0)
MCV: 107.3 fL — ABNORMAL HIGH (ref 80.0–100.0)
NRBC: 0 % (ref 0.0–0.2)
PLATELETS: 65 10*3/uL — AB (ref 150–400)
RBC: 2.18 MIL/uL — ABNORMAL LOW (ref 4.22–5.81)
RDW: 15.6 % — ABNORMAL HIGH (ref 11.5–15.5)
WBC: 8.9 10*3/uL (ref 4.0–10.5)

## 2018-07-06 LAB — GLUCOSE, CAPILLARY
GLUCOSE-CAPILLARY: 76 mg/dL (ref 70–99)
Glucose-Capillary: 102 mg/dL — ABNORMAL HIGH (ref 70–99)
Glucose-Capillary: 73 mg/dL (ref 70–99)
Glucose-Capillary: 90 mg/dL (ref 70–99)

## 2018-07-06 LAB — PREPARE RBC (CROSSMATCH)

## 2018-07-06 LAB — BASIC METABOLIC PANEL
Anion gap: 8 (ref 5–15)
BUN: 46 mg/dL — ABNORMAL HIGH (ref 8–23)
CO2: 19 mmol/L — ABNORMAL LOW (ref 22–32)
Calcium: 8.3 mg/dL — ABNORMAL LOW (ref 8.9–10.3)
Chloride: 112 mmol/L — ABNORMAL HIGH (ref 98–111)
Creatinine, Ser: 2.65 mg/dL — ABNORMAL HIGH (ref 0.61–1.24)
GFR calc Af Amer: 25 mL/min — ABNORMAL LOW (ref >60)
GFR calc non Af Amer: 21 mL/min — ABNORMAL LOW (ref >60)
Glucose, Bld: 91 mg/dL (ref 70–99)
Potassium: 4.1 mmol/L (ref 3.5–5.1)
Sodium: 139 mmol/L (ref 135–145)

## 2018-07-06 MED ORDER — CHLORHEXIDINE GLUCONATE CLOTH 2 % EX PADS
6.0000 | MEDICATED_PAD | Freq: Every day | CUTANEOUS | Status: DC
  Administered 2018-07-06: 6 via TOPICAL

## 2018-07-06 MED ORDER — PANTOPRAZOLE SODIUM 40 MG PO TBEC
40.0000 mg | DELAYED_RELEASE_TABLET | Freq: Two times a day (BID) | ORAL | Status: DC
  Administered 2018-07-06 – 2018-07-07 (×2): 40 mg via ORAL
  Filled 2018-07-06 (×3): qty 1

## 2018-07-06 MED ORDER — MUPIROCIN 2 % EX OINT
1.0000 "application " | TOPICAL_OINTMENT | Freq: Two times a day (BID) | CUTANEOUS | Status: DC
  Administered 2018-07-06 (×2): 1 via NASAL
  Filled 2018-07-06: qty 22

## 2018-07-06 MED ORDER — FUROSEMIDE 10 MG/ML IJ SOLN
40.0000 mg | Freq: Once | INTRAMUSCULAR | Status: AC
  Administered 2018-07-06: 40 mg via INTRAVENOUS
  Filled 2018-07-06: qty 4

## 2018-07-06 MED ORDER — SODIUM CHLORIDE 0.9% IV SOLUTION: Freq: Once | INTRAVENOUS | Status: DC

## 2018-07-06 MED FILL — Magnesium Sulfate Inj 50%: INTRAMUSCULAR | Qty: 10 | Status: AC

## 2018-07-06 MED FILL — Potassium Chloride Inj 2 mEq/ML: INTRAVENOUS | Qty: 40 | Status: AC

## 2018-07-06 MED FILL — Heparin Sodium (Porcine) Inj 1000 Unit/ML: INTRAMUSCULAR | Qty: 30 | Status: AC

## 2018-07-06 MED FILL — Magnesium Sulfate Inj 50%: INTRAMUSCULAR | Qty: 10 | Status: CN

## 2018-07-06 MED FILL — Potassium Chloride Inj 2 mEq/ML: INTRAVENOUS | Qty: 40 | Status: CN

## 2018-07-06 MED FILL — Heparin Sodium (Porcine) Inj 1000 Unit/ML: INTRAMUSCULAR | Qty: 30 | Status: CN

## 2018-07-06 NOTE — Progress Notes (Signed)
Pt order for CPAP is as needed. Pt not in need of at this time. RT will continue to monitor.

## 2018-07-06 NOTE — Progress Notes (Signed)
CARDIAC REHAB PHASE I   PRE:  Rate/Rhythm: 85 SR    BP: sitting 145/67    SaO2: 98 RA  MODE:  Ambulation: 320 ft   POST:  Rate/Rhythm: 117 ST    BP: sitting 185/51     SaO2: 99 RA  Pt to BR then ambulated hall. Used RW, assist x2, however needed coaching on how to use it safely.  He is bent over and pushes the RW forward at times. HR up to 117 ST. Sts groin is sore but ok. Return to bed. Encouraged to walk again this evening. Son present. [phone]  Harriet Masson CES, ACSM 07/06/2018 2:39 PM

## 2018-07-06 NOTE — Progress Notes (Addendum)
      [address]       [place] [address]             [phone]        2 Days Post-Op Procedure(s) (LRB): TRANSCATHETER AORTIC VALVE REPLACEMENT, TRANSFEMORAL (N/A) TRANSESOPHAGEAL ECHOCARDIOGRAM (TEE) INTRAOPERATIVE TRANSTHORACIC ECHOCARDIOGRAM  Subjective: Patient uncomfortable in the bed this am. He denies dizziness, abdominal pain or nausea.  Objective: Vital signs in last 24 hours: Temp:  [97.5 F (36.4 C)-98.6 F (37 C)] 98.1 F (36.7 C) (01/23 0453) Pulse Rate:  [78-83] 83 (01/23 0453) Cardiac Rhythm: Normal sinus rhythm;Heart block (01/23 0700) Resp:  [15-20] 19 (01/23 0453) BP: (108-149)/(39-55) 149/55 (01/23 0453) SpO2:  [94 %-100 %] 100 % (01/23 0453) Weight:  [294 kg] 111 kg (01/23 0348)   Current Weight  07/06/18 111 kg   Intake/Output from previous day: No intake/output data recorded.   Physical Exam:  Cardiovascular: Tachycardic, no murmur Pulmonary: Clear to auscultation bilaterally. Abdomen: Soft, non tender, obese, bowel sounds present. Extremities: Trace bilateral lower extremity edema. Doppler pulses intact.  Wounds: Swelling and ++ ecchymosis perineum, left groin, lower abdomen. Right groin is clean and dry.    Lab Results: CBC: Recent Labs    07/05/18 0430 07/06/18 0305  WBC 11.2* 8.9  HGB 8.7* 7.5*  HCT 27.2* 23.4*  PLT 89* 65*   BMET:  Recent Labs    07/05/18 0430 07/06/18 0305  NA 139 139  K 4.5 4.1  CL 113* 112*  CO2 18* 19*  GLUCOSE 164* 91  BUN 39* 46*  CREATININE 2.53* 2.65*  CALCIUM 8.2* 8.3*    PT/INR:  Lab Results  Component Value Date   INR 1.12 07/03/2018   INR 2.2 (H) 08/12/2008   INR 2.1 (H) 08/11/2008   ABG:  INR: Will add last result for INR, ABG once components are confirmed Will add last 4 CBG results once components are confirmed  Assessment/Plan:  1. CV - Tachycardia (likely related to anemia), first degree heart block. On DAPT. Echo done yesterday showed LVEF 55-60%,  no AS and mild AI (peak gradient 21 mm Hg) and trivial MR. 2.  Pulmonary - On room air. 3. AKI-creatinine slightly increased to 2.65. Re check in am 4.  Acute blood loss anemia - H and H decreased this am 7.5 and 23.4. CT abd/pelvis showed ill defined hemorrhage in left groin but no retroperitoneal continuation. Needs transfusion and PRBCs have already been ordered as well as to check for occult blood. Check CBC am 5. Thrombocytopenia-platelets 65,000. Not on Lovenox. Will monitor. 6. DM-CBGs 100/117/76. On Insulin. 7. GI-CT abd/pelvis showed cirrhosis, cholelithiasis, and midline hernias containing fat and bowel which may contribute to abdominal distension.  Donielle M ZimmermanPA-C 07/06/2018,7:40 AM [phone]  I have seen and examined the patient and agree with the assessment and plan as outlined.  CT reveals left groin hematoma w/out any retroperitoneal extension.  No sign of active bleeding.  Will transfuse 2 units PRBCs for post op acute blood loss anemia.  Check stool GUAIAC for completeness, but anemia likely due to left groin bleed complication from removal of 76F diagnostic sheath used during TAVR.  Recheck Hgb and creatinine tomorrow.  Continue to hold ARB and amlodipine  Purcell Nails, MD 07/06/2018 8:55 AM

## 2018-07-07 LAB — BPAM RBC
Blood Product Expiration Date: 202002082359
Blood Product Expiration Date: 202002082359
ISSUE DATE / TIME: 202001231036
ISSUE DATE / TIME: 202001231502
UNIT TYPE AND RH: 6200
Unit Type and Rh: 6200

## 2018-07-07 LAB — CBC
HCT: 28 % — ABNORMAL LOW (ref 39.0–52.0)
Hemoglobin: 9.1 g/dL — ABNORMAL LOW (ref 13.0–17.0)
MCH: 32.6 pg (ref 26.0–34.0)
MCHC: 32.5 g/dL (ref 30.0–36.0)
MCV: 100.4 fL — ABNORMAL HIGH (ref 80.0–100.0)
NRBC: 0 % (ref 0.0–0.2)
PLATELETS: 61 10*3/uL — AB (ref 150–400)
RBC: 2.79 MIL/uL — AB (ref 4.22–5.81)
RDW: 19.1 % — AB (ref 11.5–15.5)
WBC: 7.4 10*3/uL (ref 4.0–10.5)

## 2018-07-07 LAB — GLUCOSE, CAPILLARY: Glucose-Capillary: 88 mg/dL (ref 70–99)

## 2018-07-07 LAB — BASIC METABOLIC PANEL
Anion gap: 7 (ref 5–15)
BUN: 43 mg/dL — ABNORMAL HIGH (ref 8–23)
CO2: 22 mmol/L (ref 22–32)
CREATININE: 2.17 mg/dL — AB (ref 0.61–1.24)
Calcium: 8.8 mg/dL — ABNORMAL LOW (ref 8.9–10.3)
Chloride: 111 mmol/L (ref 98–111)
GFR calc Af Amer: 31 mL/min — ABNORMAL LOW (ref >60)
GFR calc non Af Amer: 27 mL/min — ABNORMAL LOW (ref >60)
Glucose, Bld: 91 mg/dL (ref 70–99)
Potassium: 3.9 mmol/L (ref 3.5–5.1)
Sodium: 140 mmol/L (ref 135–145)

## 2018-07-07 LAB — TYPE AND SCREEN
ABO/RH(D): A POS
ANTIBODY SCREEN: NEGATIVE
Unit division: 0
Unit division: 0

## 2018-07-07 MED ORDER — CLOPIDOGREL BISULFATE 75 MG PO TABS: 75.0000 mg | ORAL_TABLET | Freq: Every day | ORAL | 1 refills | Status: DC

## 2018-07-07 MED ORDER — MUPIROCIN 2 % EX OINT: 1.0000 "application " | TOPICAL_OINTMENT | Freq: Two times a day (BID) | CUTANEOUS | 0 refills | Status: AC

## 2018-07-07 MED ORDER — TRAMADOL HCL 50 MG PO TABS: 50.0000 mg | ORAL_TABLET | Freq: Four times a day (QID) | ORAL | 0 refills | Status: DC | PRN

## 2018-07-07 MED ORDER — AMLODIPINE BESYLATE 5 MG PO TABS
5.0000 mg | ORAL_TABLET | Freq: Every day | ORAL | Status: DC
  Administered 2018-07-07: 5 mg via ORAL
  Filled 2018-07-07: qty 1

## 2018-07-07 NOTE — Care Management Note (Signed)
Case Management Note Donn PieriniKristi Shakeita Vandevander RN, BSN Transitions of Care Unit 4E- RN Case Manager [phone]  Patient Details  Name: [name] Leer MRN: [id number] Date of Birth: 1931/07/09  Subjective/Objective:  Pt admitted s/p TAVR                    Action/Plan: PTA pt lived at home, plan to transition back home, No CM needs noted for return home.   Expected Discharge Date:  07/07/18               Expected Discharge Plan:  Home/Self Care  In-House Referral:  NA  Discharge planning Services  CM Consult  Post Acute Care Choice:  NA Choice offered to:  NA  DME Arranged:    DME Agency:     HH Arranged:    HH Agency:     Status of Service:  Completed, signed off  If discussed at Long Length of Stay Meetings, dates discussed:    Discharge Disposition: home/self care   Additional Comments:  Darrold SpanWebster, Jermond Burkemper Hall, RN 07/07/2018, 10:56 AM

## 2018-07-07 NOTE — Progress Notes (Addendum)
RT asked pt again tonight if he wanted CPAP and pt asked to be placed on it at this time. CPAP dream station using his own nasal pillows on auto titrate with no O2 bled into the system. Pt Max 20 Min 5. RT will continue to monitor.

## 2018-07-07 NOTE — Progress Notes (Signed)
Order received to discharge patient.  Telemetry monitor removed and CCMD notified.  PIV access removed.  Right IJ catheter removed per MD orders without difficulty.  Discharge instructions, follow up, medications and  Instructions for their use discussed with patient.

## 2018-07-07 NOTE — Progress Notes (Addendum)
      [address]       [place] [address]             [phone]        3 Days Post-Op Procedure(s) (LRB): TRANSCATHETER AORTIC VALVE REPLACEMENT, TRANSFEMORAL (N/A) TRANSESOPHAGEAL ECHOCARDIOGRAM (TEE) INTRAOPERATIVE TRANSTHORACIC ECHOCARDIOGRAM  Subjective: Patient uncomfortable in the bed this am. He denies dizziness, abdominal pain or nausea.  Objective: Vital signs in last 24 hours: Temp:  [97.3 F (36.3 C)-98.1 F (36.7 C)] 97.9 F (36.6 C) (01/24 0713) Pulse Rate:  [67-88] 88 (01/24 0457) Cardiac Rhythm: Normal sinus rhythm;Heart block (01/24 0700) Resp:  [16-20] 18 (01/24 0713) BP: (134-170)/(47-58) 150/58 (01/24 0713) SpO2:  [96 %-100 %] 97 % (01/24 0713) FiO2 (%):  [21 %] 21 % (01/23 2343) Weight:  [016 kg] 112 kg (01/24 0457)   Current Weight  07/07/18 112 kg   Intake/Output from previous day: 01/23 0701 - 01/24 0700 In: 841 [P.O.:838; I.V.:3] Out: 1076 [Urine:1075; Stool:1]   Physical Exam:  Cardiovascular: RRR, no murmur Pulmonary: Clear to auscultation bilaterally. Abdomen: Soft, non tender, obese, bowel sounds present. Extremities: Trace bilateral lower extremity edema. Feet warm and Doppler pulses intact bilaterally Wounds: Swelling and ++ ecchymosis perineum and penis, left groin, lower abdomen. Right groin is clean and dry-hematoma appears stable    Lab Results: CBC: Recent Labs    07/06/18 0305 07/07/18 0330  WBC 8.9 7.4  HGB 7.5* 9.1*  HCT 23.4* 28.0*  PLT 65* 61*   BMET:  Recent Labs    07/06/18 0305 07/07/18 0330  NA 139 140  K 4.1 3.9  CL 112* 111  CO2 19* 22  GLUCOSE 91 91  BUN 46* 43*  CREATININE 2.65* 2.17*  CALCIUM 8.3* 8.8*    PT/INR:  Lab Results  Component Value Date   INR 1.12 07/03/2018   INR 2.2 (H) 08/12/2008   INR 2.1 (H) 08/11/2008   ABG:  INR: Will add last result for INR, ABG once components are confirmed Will add last 4 CBG results once components are  confirmed  Assessment/Plan:  1. CV - SR in the 80's, first degree heart block. On DAPT. Is more hypertensive so will restart Amlodipine but not Losartan as creatinine still elevated. 2.  Pulmonary - On room air. 3. AKI-creatinine decreased to 2.17.  4.  Acute blood loss anemia - H and H  this am up to 9.1 and 28. CT abd/pelvis yesterday showed left groin hematoma but no retroperitoneal continuation.  5. Thrombocytopenia-platelets 61,000. Not on Lovenox. Will monitor. 6. DM-CBGs 73/90/88. On Insulin. 7. As discussed with Dr. Cornelius Moras, will discharge. Patient will need CBC and BMET as outpatient  Donielle M ZimmermanPA-C 07/07/2018,8:05 AM [phone]   I have seen and examined the patient and agree with the assessment and plan as outlined.  Restart amlodipine.  D/C home today.  Will check f/u CBC and BMET in office next week.  Purcell Nails, MD 07/07/2018 9:44 AM

## 2018-07-07 NOTE — Progress Notes (Signed)
CARDIAC REHAB PHASE I   PRE:  Rate/Rhythm: 89 SR    BP: sitting 135/59    SaO2: 96 RA  MODE:  Ambulation: 330 ft   POST:  Rate/Rhythm: 113 ST    BP: sitting 150/51     SaO2: 98 RA   Pt moving well. Able to get out of bed and walk with RW. Did better staying close today although fatigued toward end. Pt declined rest breaks. HR up to 113, which was improved. To recliner. Attempted to discuss restrictions and activity/exercise however pt changed the subject each time. He is not very interested in mobility or activity at home. Pt would benefit from HHPT to ensure safety with RW in house (he likes to let go of RW and hold to furniture). Declined CRPII.  [phone]  Harriet Masson CES, ACSM 07/07/2018 9:22 AM

## 2018-07-07 NOTE — Care Management Important Message (Signed)
Important Message  Patient Details  Name: [name] [name] MRN: [id number] Date of Birth: [birth year]   Medicare Important Message Given:  Yes    Ailey Wessling P [place] Scheiderer 07/07/2018, 11:17 AM

## 2018-07-13 ENCOUNTER — Ambulatory Visit (INDEPENDENT_AMBULATORY_CARE_PROVIDER_SITE_OTHER): Payer: Medicare Other | Admitting: Physician Assistant

## 2018-07-13 ENCOUNTER — Encounter: Payer: Self-pay | Admitting: Physician Assistant

## 2018-07-13 VITALS — BP 140/76 | HR 93 | Ht 70.0 in | Wt 242.8 lb

## 2018-07-13 DIAGNOSIS — N289 Disorder of kidney and ureter, unspecified: Secondary | ICD-10-CM | POA: Diagnosis not present

## 2018-07-13 DIAGNOSIS — S301XXS Contusion of abdominal wall, sequela: Secondary | ICD-10-CM

## 2018-07-13 DIAGNOSIS — I10 Essential (primary) hypertension: Secondary | ICD-10-CM

## 2018-07-13 DIAGNOSIS — Z952 Presence of prosthetic heart valve: Secondary | ICD-10-CM

## 2018-07-13 MED ORDER — AMOXICILLIN 500 MG PO TABS: ORAL_TABLET | ORAL | 6 refills | Status: DC

## 2018-07-13 NOTE — Patient Instructions (Addendum)
Medication Instructions:  Florentina Addison discussed the importance of taking an antibiotic prior to any dental visits to prevent damage to the heart valves from infection. You were given a prescription for AMOXIL 2,000 mg to take 1 hour prior to dental appointments based on current SBE prophylaxis guidelines.  Labwork: TODAY: BMET, CBC  Testing/Procedures: No new orders   Follow-Up: Please keep your appointments as scheduled on 08/09/2018. Please arrive by 1:30AM.

## 2018-07-13 NOTE — Progress Notes (Signed)
[facility]   MULTIDISCIPLINARY HEART VALVE CLINIC                                       Cardiology Office Note    Date:  07/13/2018   ID:  [name] [name], DOB September 17, 1931, MRN [id number]  PCP:  Jarome Matin, MD  Cardiologist:  Dr. Swaziland / Dr. Excell Seltzer & Dr. Cornelius Moras (TAVR)  CC: [facility] s/p TAVR  History of Present Illness:  [name] [name] is a 85 y.o. male with a history of IDDM, GERD with hiatal hernia, cholelithiasis, diverticulosis, ventral incisional hernia, hypertension,OSA on CPAP,peripheral arterial disease, remote history of previous aortobiiliac grafting for abdominal aortic aneurysm and severe AS s/p TAVR (07/04/18) who presents to clinic for follow up.   Patient is morbidly obese and has been very sedentary for many years. He has very limited physical activity. He admits to a long history of exertional shortness of breath. He has had off-and-on problems with abdominal pain and nausea for the past year and a half. Gallbladder ultrasound revealed cholelithiasis without evidence of cholecystitis. Upper GI barium contrast study was unrevealing. The patient eventually underwent a gallbladder nuclear scan which revealed a low gallbladder ejection fraction. The possibility of elective cholecystectomy was considered.Because of the presence of a heart murmur on physical exam and atypical symptoms of chest pain the patient was referred for cardiology clearance.   He was found to have severe aortic stenosis and referred to the multidisciplinary valve team. L/RHC showed mild non obstructive CAD except for a 80% stenosis of her RV marginal branch.  He underwent successful TAVR with a 26 mm Edwards Sapien 3 THV via the TF approach. Post op ecg showed a new LBBB. His hospital course was complicated by labile blood pressure, AKI with creatinine as high as 2.6 and acute blood loss anemia 2/2 groin hematoma. Losartan was held and he was transfused. Post op echo showed LVEF 55-60% with normally  functioning TAVR valve. He was discharged on aspirin and plavix.   Today he presents to clinic for follow up. He is here with his son [name] [name]. He is doing okay. He has no appetite and continues to have abdominal pain. No chest pain. He still has some shortness of breath. No LE edema, orthopnea or PND. No dizziness or syncope. He overall just feels tired and sore.   Past Medical History:  Diagnosis Date  . Arthritis of back   . CAD (coronary artery disease)   . CKD (chronic kidney disease) stage 3, GFR 30-59 ml/min (HCC)   . Diabetes mellitus, type 2 (HCC)   . Gallstones   . History of gout   . History of hiatal hernia   . Hx of urinary tract infection   . Hypercholesterolemia   . Hypertension   . Left groin hematoma 07/04/2018  . Osteoarthritis    left knee  . PVD (peripheral vascular disease) (HCC)   . 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 Severe aortic stenosis   . Sleep apnea     Past Surgical History:  Procedure Laterality Date  . ABDOMINAL AORTIC ANEURYSM REPAIR    . APPENDECTOMY    . FEMORAL-POPLITEAL BYPASS GRAFT     left  . INTRAOPERATIVE TRANSTHORACIC ECHOCARDIOGRAM  07/04/2018   Procedure: INTRAOPERATIVE TRANSTHORACIC ECHOCARDIOGRAM;  Surgeon:  Tonny Bollmanooper, Michael, MD;  [facility];  Service: Open Heart Surgery;;  . LUMBAR SPINE SURGERY    . RIGHT/LEFT HEART CATH AND CORONARY ANGIOGRAPHY N/A 06/13/2018   Procedure: RIGHT/LEFT HEART CATH AND CORONARY ANGIOGRAPHY;  Surgeon: SwazilandJordan, Peter M, MD;  [facility];  Service: Cardiovascular;  Laterality: N/A;  . TEE WITHOUT CARDIOVERSION  07/04/2018   Procedure: TRANSESOPHAGEAL ECHOCARDIOGRAM (TEE);  Surgeon: Tonny Bollmanooper, Michael, MD;  [facility];  Service: Open Heart Surgery;;  . TOTAL KNEE ARTHROPLASTY     bilateral  . TRANSCATHETER AORTIC VALVE REPLACEMENT, TRANSFEMORAL  07/04/2018  .  TRANSCATHETER AORTIC VALVE REPLACEMENT, TRANSFEMORAL N/A 07/04/2018   Procedure: TRANSCATHETER AORTIC VALVE REPLACEMENT, TRANSFEMORAL;  Surgeon: Tonny Bollmanooper, Michael, MD;  [facility];  Service: Open Heart Surgery;  Laterality: N/A;    Current Medications: Outpatient Medications Prior to Visit  Medication Sig Dispense Refill  . acetaminophen (TYLENOL) 325 MG tablet Take 2 tablets (650 mg total) by mouth every 6 (six) hours as needed for mild pain.    Marland Kitchen. allopurinol (ZYLOPRIM) 300 MG tablet Take 300 mg by mouth daily.     Marland Kitchen. amLODipine (NORVASC) 5 MG tablet Take 5 mg by mouth daily.     Marland Kitchen. aspirin 81 MG tablet Take 81 mg by mouth daily.    . carboxymethylcellulose (REFRESH PLUS) 0.5 % SOLN Place 1 drop into both eyes daily as needed (dry eyes).    . clopidogrel (PLAVIX) 75 MG tablet Take 1 tablet (75 mg total) by mouth daily with breakfast. 30 tablet 1  . dorzolamide-timolol (COSOPT) 22.3-6.8 MG/ML ophthalmic solution Place 1 drop into the left eye 2 (two) times daily.    . hydrocortisone cream 1 % Apply 1 application topically daily as needed for itching.    . insulin detemir (LEVEMIR) 100 UNIT/ML injection Inject 34 Units into the skin every morning.     . Multiple Vitamin (MULTIVITAMIN WITH MINERALS) TABS tablet Take 2 tablets by mouth daily.    . pioglitazone (ACTOS) 15 MG tablet Take 15 mg by mouth daily.     . simvastatin (ZOCOR) 80 MG tablet Take 80 mg by mouth daily.     . traMADol (ULTRAM) 50 MG tablet Take 1 tablet (50 mg total) by mouth every 6 (six) hours as needed for moderate pain. (Patient not taking: Reported on 07/13/2018) 24 tablet 0   No facility-administered medications prior to visit.      Allergies:   Indocin [indomethacin]   Social History   Socioeconomic History  . Marital status: Married    Spouse name: Not on file  . Number of children: 4  . Years of education: Not on file  . Highest education level: Not on file  Occupational History  . Occupation: Art gallery managerengineer-  telephone industry  Social Needs  . Financial resource strain: Not on file  . Food insecurity:    Worry: Not on file    Inability: Not on file  . Transportation needs:    Medical: Not on file    Non-medical: Not on file  Tobacco Use  . Smoking status: Former Smoker    Packs/day: 2.00    Years: 30.00    Pack years: 60.00    Types: Cigarettes    Last attempt to quit: 11/04/1984    Years since quitting: 33.7  . Smokeless tobacco: Never Used  Substance and Sexual Activity  . Alcohol use: Yes    Alcohol/week: 0.0 standard drinks    Comment: rarely  . Drug use:  No  . Sexual activity: Not on file  Lifestyle  . Physical activity:    Days per week: Not on file    Minutes per session: Not on file  . Stress: Not on file  Relationships  . Social connections:    Talks on phone: Not on file    Gets together: Not on file    Attends religious service: Not on file    Active member of club or organization: Not on file    Attends meetings of clubs or organizations: Not on file    Relationship status: Not on file  Other Topics Concern  . Not on file  Social History Narrative  . Not on file     Family History:  The patient's family history includes Alzheimer's disease in his father; Cancer in his sister; Heart attack in his mother.      ROS:   Please see the history of present illness.    ROS All other systems reviewed and are negative.   PHYSICAL EXAM:   VS:  BP 140/76   Pulse 93   Ht 5\' 10"  (1.778 m)   Wt 242 lb 12.8 oz (110.1 kg)   BMI 34.84 kg/m    GEN: Well nourished, well developed, in no acute distress HEENT: normal Neck: no JVD or masses Cardiac: RRR; 2/6 systolic murmur. No rubs, or gallops,no edema  Respiratory:  clear to auscultation bilaterally, normal work of breathing GI: soft, nontender, nondistended, + BS MS: no deformity or atrophy Skin: warm and dry, no rash. Eecchymosis of the perineum, penis left groin and lower left abdomen. Neuro:  Alert and Oriented  x 3, Strength and sensation are intact Psych: euthymic mood, full affect   Wt Readings from Last 3 Encounters:  07/13/18 242 lb 12.8 oz (110.1 kg)  07/07/18 246 lb 14.6 oz (112 kg)  07/03/18 244 lb 3 oz (110.8 kg)      Studies/Labs Reviewed:   EKG:  EKG is ordered today.  The ekg ordered today demonstrates sinus with LBBB and 1st degree AV block   Recent Labs: 07/03/2018: ALT 13; B Natriuretic Peptide 102.7 07/05/2018: Magnesium 2.0 07/07/2018: BUN 43; Creatinine, Ser 2.17; Hemoglobin 9.1; Platelets 61; Potassium 3.9; Sodium 140   Lipid Panel No results found for: CHOL, TRIG, HDL, CHOLHDL, VLDL, LDLCALC, LDLDIRECT  Additional studies/ records that were reviewed today include:  TAVR OPERATIVE NOTE   Date of Procedure:                07/04/2018  Preoperative Diagnosis:      Severe Aortic Stenosis   Postoperative Diagnosis:    Same   Procedure:        Transcatheter Aortic Valve Replacement - Percutaneous Right Transfemoral Approach             Edwards Sapien 3 THV (size 26 mm, model # 9600TFX, serial # [id number])              Co-Surgeons:                        Salvatore Decentlarence H. Cornelius Moraswen, MD and Tonny BollmanMichael Cooper, MD  Anesthesiologist:                  Kipp Broodavid Joslin, MD  Echocardiographer:              Charlton HawsPeter Nishan, MD  Pre-operative Echo Findings: ? Severe aortic stenosis ? Normal left ventricular systolic function  Post-operative Echo Findings: ? Mild (1+/2+)  paravalvular leak ? Normal left ventricular systolic function  __________________  Echo 07/05/2018 Study Conclusions - Left ventricle: The cavity size was normal. Systolic function was   normal. The estimated ejection fraction was in the range of 55%   to 60%. Wall motion was normal; there were no regional wall   motion abnormalities. Doppler parameters are consistent with   abnormal left ventricular relaxation (grade 1 diastolic   dysfunction). Doppler parameters are consistent with   indeterminate  ventricular filling pressure. - Aortic valve: A 26mm Edwards Sapien 3 TAVR bioprosthesis was   present. Transvalvular velocity was within the normal range.   There was no stenosis. There was mild regurgitation. Peak   velocity (S): 229 cm/s. Valve area (VTI): 2.01 cm^2. Valve area   (Vmax): 2.03 cm^2. Valve area (Vmean): 2.07 cm^2. - Mitral valve: There was trivial regurgitation. - Right ventricle: The cavity size was normal. Wall thickness was   normal. Systolic function was normal. - Tricuspid valve: There was no regurgitation.  ASSESSMENT & PLAN:   Severe AS s/p TAVR: doing okay. Groin site is stable with extensive healing hematoma. ECG today with LBBB and 1st degree AV block. Continue Aspirin and plavix. Will check a CBC today given groin bleed requiring transfusion. SBE prophylaxis discussed; I have RX'd amoxicillin. I will see him back next month for follow up and echo.   HTN: BP borderline elevated today. Will check BMET and if renal function back to baseline I will resume home Losartan 100 mg daily (but cut it back to 50mg  daily). If renal function remains elevated I will discontinue Losartan and increase amlodipine to 10 mg daily.   CKD stage IV: creat baseline 1.4 to 1.9. Creat 2.17 at discharge. Losartan 100mg  held. Will recheck BMET today    Medication Adjustments/Labs and Tests Ordered: Current medicines are reviewed at length with the patient today.  Concerns regarding medicines are outlined above.  Medication changes, Labs and Tests ordered today are listed in the Patient Instructions below. Patient Instructions  Medication Instructions:  Florentina Addison discussed the importance of taking an antibiotic prior to any dental visits to prevent damage to the heart valves from infection. You were given a prescription for AMOXIL 2,000 mg to take 1 hour prior to dental appointments based on current SBE prophylaxis guidelines.  Labwork: TODAY: BMET, CBC  Testing/Procedures: No new orders    Follow-Up: Please keep your appointments as scheduled on 08/09/2018. Please arrive by 1:30AM.    Signed, Cline Crock, PA-C  07/13/2018 8:54 PM    [facility] [address] [place], [place], [place]  [address] Phone: [phone]; Fax: [phone]

## 2018-07-14 LAB — BASIC METABOLIC PANEL
BUN/Creatinine Ratio: 22 (ref 10–24)
BUN: 40 mg/dL — ABNORMAL HIGH (ref 8–27)
CALCIUM: 9.1 mg/dL (ref 8.6–10.2)
CO2: 21 mmol/L (ref 20–29)
Chloride: 106 mmol/L (ref 96–106)
Creatinine, Ser: 1.84 mg/dL — ABNORMAL HIGH (ref 0.76–1.27)
GFR calc Af Amer: 38 mL/min/{1.73_m2} — ABNORMAL LOW (ref >59)
GFR calc non Af Amer: 33 mL/min/{1.73_m2} — ABNORMAL LOW (ref >59)
Glucose: 95 mg/dL (ref 65–99)
Potassium: 4.6 mmol/L (ref 3.5–5.2)
Sodium: 143 mmol/L (ref 134–144)

## 2018-07-14 LAB — CBC WITH DIFFERENTIAL/PLATELET
BASOS ABS: 0.1 10*3/uL (ref 0.0–0.2)
Basos: 1 %
EOS (ABSOLUTE): 0.3 10*3/uL (ref 0.0–0.4)
Eos: 3 %
Hematocrit: 30 % — ABNORMAL LOW (ref 37.5–51.0)
Hemoglobin: 10 g/dL — ABNORMAL LOW (ref 13.0–17.7)
Immature Grans (Abs): 0.1 10*3/uL (ref 0.0–0.1)
Immature Granulocytes: 1 %
LYMPHS: 10 %
Lymphocytes Absolute: 0.8 10*3/uL (ref 0.7–3.1)
MCH: 32.8 pg (ref 26.6–33.0)
MCHC: 33.3 g/dL (ref 31.5–35.7)
MCV: 98 fL — ABNORMAL HIGH (ref 79–97)
Monocytes Absolute: 0.7 10*3/uL (ref 0.1–0.9)
Monocytes: 8 %
Neutrophils Absolute: 6 10*3/uL (ref 1.4–7.0)
Neutrophils: 77 %
PLATELETS: 104 10*3/uL — AB (ref 150–450)
RBC: 3.05 x10E6/uL — ABNORMAL LOW (ref 4.14–5.80)
RDW: 15.8 % — ABNORMAL HIGH (ref 11.6–15.4)
WBC: 7.9 10*3/uL (ref 3.4–10.8)

## 2018-07-17 ENCOUNTER — Other Ambulatory Visit: Payer: Self-pay | Admitting: Physician Assistant

## 2018-07-17 ENCOUNTER — Telehealth: Payer: Self-pay | Admitting: Physician Assistant

## 2018-07-17 MED ORDER — ATORVASTATIN CALCIUM 80 MG PO TABS: 80.0000 mg | ORAL_TABLET | Freq: Every day | ORAL | 3 refills | Status: DC

## 2018-07-17 MED ORDER — AMLODIPINE BESYLATE 10 MG PO TABS: 10.0000 mg | ORAL_TABLET | Freq: Every day | ORAL | 3 refills | Status: DC

## 2018-07-17 NOTE — Telephone Encounter (Signed)
Yes, thank you. Let's switch his Zocor to atorva 80 mg daily.

## 2018-07-17 NOTE — Telephone Encounter (Signed)
New message   Patient had a heart replacement done on 07/04/2018 and wants to know when he can drive? Please advise

## 2018-07-17 NOTE — Telephone Encounter (Signed)
This is Dr. Jordan's pt. °

## 2018-07-17 NOTE — Telephone Encounter (Signed)
Amlodipine was increased today--see tele note. Rx was sent in for 90 with 3 refills.

## 2018-07-17 NOTE — Telephone Encounter (Signed)
-----   Message from Janetta Hora, PA-C sent at 07/14/2018  9:46 AM EST ----- Creat is improved from admission but still elevated. I would like him to increase his amlodipine to 10mg  daily. He should continue to hold Losartan. Blood counts are improving. He is cleared to drive.

## 2018-07-17 NOTE — Telephone Encounter (Signed)
°*  STAT* If patient is at the pharmacy, call can be transferred to refill team.   1. Which medications need to be refilled? (please list name of each medication and dose if known) AMLODIPINE  2. Which pharmacy/location (including street and city if local pharmacy) is medication to be sent to?  [facility] - [place], [place] - [address]   3. Do they need a 30 day or 90 day supply? 30 days

## 2018-07-17 NOTE — Telephone Encounter (Signed)
Instructed patient to STOP ZOCOR and START ATORVASTATIN 80 mg daily. Reiterated to patient new medication changes: 1) INCREASE AMLODIPINE to 10 mg daily 2) STOP LOSARTAN 3) STOP ZOCOR 4) START ATORVASTATIN 80 mg daily  He was grateful for call and agrees with treatment plan.

## 2018-07-17 NOTE — Telephone Encounter (Signed)
Reviewed results with patient who verbalized understanding.   Instructed patient to INCREASE AMLODIPINE to 10 mg daily. He understands to stay off Losartan. Informed him he is clear to drive. He was grateful for call and agrees with treatment plan.   Will route to Carlean Jews for simvastatin instructions (if increasing amlodipine to 10 mg will need to switch Zocor as he is on 80 mg qd).

## 2018-07-17 NOTE — Telephone Encounter (Signed)
Recent TAVR

## 2018-07-20 ENCOUNTER — Encounter: Payer: Self-pay | Admitting: Thoracic Surgery (Cardiothoracic Vascular Surgery)

## 2018-08-07 NOTE — Progress Notes (Signed)
[facility]   MULTIDISCIPLINARY HEART VALVE CLINIC                                       Cardiology Office Note    Date:  08/09/2018   ID:  [name] [name], DOB 02/13/1932, MRN [id number]  PCP:  Jarome Matin, MD  Cardiologist:  Dr. Swaziland / Dr. Excell Seltzer & Dr. Cornelius Moras (TAVR)  CC: 1 month s/p TAVR  History of Present Illness:  [name] [name] is a 85 y.o. male with a history of IDDM, GERD with hiatal hernia, cholelithiasis, diverticulosis, ventral incisional hernia, hypertension,OSA on CPAP,peripheral arterial disease, remote history of previous aortobiiliac grafting for abdominal aortic aneurysm and severe AS s/p TAVR (07/04/18) who presents to clinic for follow up.   Patient is morbidly obese and has been very sedentary for many years. He has very limited physical activity. He admits to a long history of exertional shortness of breath. He has had off-and-on problems with abdominal pain and nausea for the past year and a half. Gallbladder ultrasound revealed cholelithiasis without evidence of cholecystitis. Upper GI barium contrast study was unrevealing. The patient eventually underwent a gallbladder nuclear scan which revealed a low gallbladder ejection fraction. The possibility of elective cholecystectomy was considered.Because of the presence of a heart murmur on physical exam and atypical symptoms of chest pain the patient was referred for cardiology clearance by Dr. Swaziland. He was found to have severe aortic stenosis and referred to the multidisciplinary valve team. L/RHC showed mild non obstructive CAD except for a 80% stenosis of her RV marginal branch. Medical therapy was recommended.   He underwent successful TAVR with a 26 mm Edwards Sapien 3 THV via the TF approach. Post op ecg showed a new LBBB. His hospital course was complicated by labile blood pressure, AKI with creatinine as high as 2.6 and acute blood loss anemia 2/2 groin hematoma. Losartan was held and he was  transfused. Post op echo showed LVEF 55-60% with normally functioning TAVR valve. He was discharged on aspirin and plavix.   At follow up he continued to have soreness and poor appetite. Follow up hg showed improving blood counts from 9.1--> 10. Creat remained elevated ~1.84. I discontinued his Losartan and increased amlodipine from  to  daily. Zocor switched to atorva given increased amlodipine.  Today he presents to clinic for follow up. He continues to have abdominal pain with constant pressure in his belly and early satiety. He is not very active. The most activity he gets walking around the house. He rides a scooter to go get the mail. He can climb a flight of stairs with no issues. No CP or SOB. No LE edema, orthopnea or PND. No dizziness or syncope. No blood in stool or urine. No palpitations.     Past Medical History:  Diagnosis Date  . Arthritis of back   . CAD (coronary artery disease)   . CKD (chronic kidney disease) stage 3, GFR 30-59 ml/min (HCC)   . Diabetes mellitus, type 2 (HCC)   . Gallstones   . History of gout   . History of hiatal hernia   . Hx of urinary tract infection   . Hypercholesterolemia   . Hypertension   . Left groin hematoma 07/04/2018  . Osteoarthritis    left knee  . PVD (peripheral vascular disease) (HCC)   . 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  26 mm Edwards Sapien 3 transcatheter heart valve placed via percutaneous right transfemoral approach   . Severe aortic stenosis   . Sleep apnea     Past Surgical History:  Procedure Laterality Date  . ABDOMINAL AORTIC ANEURYSM REPAIR    . APPENDECTOMY    . FEMORAL-POPLITEAL BYPASS GRAFT     left  . INTRAOPERATIVE TRANSTHORACIC ECHOCARDIOGRAM  07/04/2018   Procedure: INTRAOPERATIVE TRANSTHORACIC ECHOCARDIOGRAM;  Surgeon: Tonny Bollman, MD;  Location: [facility] OR;  Service: Open Heart Surgery;;  . LUMBAR SPINE SURGERY    . RIGHT/LEFT HEART CATH AND CORONARY ANGIOGRAPHY N/A  06/13/2018   Procedure: RIGHT/LEFT HEART CATH AND CORONARY ANGIOGRAPHY;  Surgeon: Swaziland, Peter M, MD;  Location: [facility] INVASIVE CV LAB;  Service: Cardiovascular;  Laterality: N/A;  . TEE WITHOUT CARDIOVERSION  07/04/2018   Procedure: TRANSESOPHAGEAL ECHOCARDIOGRAM (TEE);  Surgeon: Tonny Bollman, MD;  Location: [facility] OR;  Service: Open Heart Surgery;;  . TOTAL KNEE ARTHROPLASTY     bilateral  . TRANSCATHETER AORTIC VALVE REPLACEMENT, TRANSFEMORAL  07/04/2018  . TRANSCATHETER AORTIC VALVE REPLACEMENT, TRANSFEMORAL N/A 07/04/2018   Procedure: TRANSCATHETER AORTIC VALVE REPLACEMENT, TRANSFEMORAL;  Surgeon: Tonny Bollman, MD;  Location: [facility] OR;  Service: Open Heart Surgery;  Laterality: N/A;    Current Medications: Outpatient Medications Prior to Visit  Medication Sig Dispense Refill  . acetaminophen (TYLENOL) 325 MG tablet Take 2 tablets (650 mg total) by mouth every 6 (six) hours as needed for mild pain.    Marland Kitchen allopurinol (ZYLOPRIM) 300 MG tablet Take 300 mg by mouth daily.     Marland Kitchen amLODipine (NORVASC) 10 MG tablet Take 1 tablet (10 mg total) by mouth daily. 90 tablet 3  . amoxicillin (AMOXIL) 500 MG tablet Take 4 tablets (2,000 mg) one hour prior to all dental appointments. 8 tablet 6  . aspirin 81 MG tablet Take 81 mg by mouth daily.    Marland Kitchen atorvastatin (LIPITOR) 80 MG tablet Take 1 tablet (80 mg total) by mouth daily. 90 tablet 3  . carboxymethylcellulose (REFRESH PLUS) 0.5 % SOLN Place 1 drop into both eyes daily as needed (dry eyes).    . dorzolamide-timolol (COSOPT) 22.3-6.8 MG/ML ophthalmic solution Place 1 drop into the left eye 2 (two) times daily.    . hydrocortisone cream 1 % Apply 1 application topically daily as needed for itching.    . insulin detemir (LEVEMIR) 100 UNIT/ML injection Inject 34 Units into the skin every morning.     . Multiple Vitamin (MULTIVITAMIN WITH MINERALS) TABS tablet Take 2 tablets by mouth daily.    . nitroGLYCERIN (NITROSTAT) 0.4 MG SL tablet PLACE 1 TABLET UNDER  THE TONGUE AS NEEDED FOR CHEST PAIN  12  . pioglitazone (ACTOS) 15 MG tablet Take 15 mg by mouth daily.     . clopidogrel (PLAVIX) 75 MG tablet Take 1 tablet (75 mg total) by mouth daily with breakfast. 30 tablet 1  . simvastatin (ZOCOR) 40 MG tablet      No facility-administered medications prior to visit.      Allergies:   Indocin [indomethacin]   Social History   Socioeconomic History  . Marital status: Married    Spouse name: Not on file  . Number of children: 4  . Years of education: Not on file  . Highest education level: Not on file  Occupational History  . Occupation: Art gallery manager- [employer]  Social Needs  . Financial resource strain: Not on file  . Food insecurity:    Worry: Not on file  Inability: Not on file  . Transportation needs:    Medical: Not on file    Non-medical: Not on file  Tobacco Use  . Smoking status: Former Smoker    Packs/day: 2.00    Years: 30.00    Pack years: 60.00    Types: Cigarettes    Last attempt to quit: 11/04/1984    Years since quitting: 33.7  . Smokeless tobacco: Never Used  Substance and Sexual Activity  . Alcohol use: Yes    Alcohol/week: 0.0 standard drinks    Comment: rarely  . Drug use: No  . Sexual activity: Not on file  Lifestyle  . Physical activity:    Days per week: Not on file    Minutes per session: Not on file  . Stress: Not on file  Relationships  . Social connections:    Talks on phone: Not on file    Gets together: Not on file    Attends religious service: Not on file    Active member of club or organization: Not on file    Attends meetings of clubs or organizations: Not on file    Relationship status: Not on file  Other Topics Concern  . Not on file  Social History Narrative  . Not on file     Family History:  The patient's family history includes Alzheimer's disease in his father; Cancer in his sister; Heart attack in his mother.      ROS:   Please see the history of present illness.      ROS All other systems reviewed and are negative.   PHYSICAL EXAM:   VS:  BP 128/74   Pulse 91   Ht 5\' 9"  (1.753 m)   Wt 239 lb (108.4 kg)   SpO2 95%   BMI 35.29 kg/m    GEN: Well nourished, well developed, in no acute distress, obese HEENT: normal Neck: no JVD or masses Cardiac: RRR; 2/6 systolic murmur. No rubs, or gallops,no edema  Respiratory:  clear to auscultation bilaterally, normal work of breathing GI: soft, nontender, nondistended, + BS MS: no deformity or atrophy Skin: warm and dry, no rash.  Neuro:  Alert and Oriented x 3, Strength and sensation are intact Psych: euthymic mood, full affect   Wt Readings from Last 3 Encounters:  08/09/18 239 lb (108.4 kg)  07/13/18 242 lb 12.8 oz (110.1 kg)  07/07/18 246 lb 14.6 oz (112 kg)      Studies/Labs Reviewed:   EKG:  EKG is NOT ordered today.   Recent Labs: 07/03/2018: ALT 13; B Natriuretic Peptide 102.7 07/05/2018: Magnesium 2.0 07/13/2018: BUN 40; Creatinine, Ser 1.84; Hemoglobin 10.0; Platelets 104; Potassium 4.6; Sodium 143   Lipid Panel No results found for: CHOL, TRIG, HDL, CHOLHDL, VLDL, LDLCALC, LDLDIRECT  Additional studies/ records that were reviewed today include:  TAVR OPERATIVE NOTE   Date of Procedure:                07/04/2018  Preoperative Diagnosis:      Severe Aortic Stenosis   Postoperative Diagnosis:    Same   Procedure:        Transcatheter Aortic Valve Replacement - Percutaneous Right Transfemoral Approach             Edwards Sapien 3 THV (size 26 mm, model # 9600TFX, serial # [id number])              Co-Surgeons:  Salvatore Decent. Cornelius Moras, MD and Tonny Bollman, MD  Anesthesiologist:                  Kipp Brood, MD  Echocardiographer:              Charlton Haws, MD  Pre-operative Echo Findings: ? Severe aortic stenosis ? Normal left ventricular systolic function  Post-operative Echo Findings: ? Mild (1+/2+) paravalvular leak ? Normal left ventricular  systolic function  __________________  Echo 07/05/2018 Study Conclusions - Left ventricle: The cavity size was normal. Systolic function was   normal. The estimated ejection fraction was in the range of 55%   to 60%. Wall motion was normal; there were no regional wall   motion abnormalities. Doppler parameters are consistent with   abnormal left ventricular relaxation (grade 1 diastolic   dysfunction). Doppler parameters are consistent with   indeterminate ventricular filling pressure. - Aortic valve: A 21mm Edwards Sapien 3 TAVR bioprosthesis was   present. Transvalvular velocity was within the normal range.   There was no stenosis. There was mild regurgitation. Peak   velocity (S): 229 cm/s. Valve area (VTI): 2.01 cm^2. Valve area   (Vmax): 2.03 cm^2. Valve area (Vmean): 2.07 cm^2. - Mitral valve: There was trivial regurgitation. - Right ventricle: The cavity size was normal. Wall thickness was   normal. Systolic function was normal. - Tricuspid valve: There was no regurgitation.  _______________  Echo 07/20/18 IMPRESSIONS    1. The left ventricle has low normal systolic function, with an ejection fraction of 50-55%. The cavity size was normal. Left ventricular diastology could not be evaluated due to nondiagnostic images.  2. The right ventricle has normal systolic function. The cavity was normal. There is no increase in right ventricular wall thickness.  3. The mitral valve is normal in structure.  4. The tricuspid valve is normal in structure.  5. A 20mm Edwards Sapien bioprosthetic aortic valve (TAVR) valve is present in the aortic position. Procedure Date: 07/04/18. Trivial posterior paravalvular regurgitation, no evidence of prosthetic regurgitation.  6. AV Mean systolic Grad: 11.0 mmHg.  ASSESSMENT & PLAN:   Severe AS s/p TAVR: echo today shows EF 50%, normally functioning TAVR with mean gradient 11 mm Hg and trivial posterior PVL. He has NYHA class II symptoms but it  not very active. SBE prophylaxis discussed; he has amoxicillin. Plavix can be discontinued after 6 months of therapy (12/2018). I will see him back in 1 year with echo.   HTN: BP well controlled today   CKD stage IV: creat baseline 1.5-1.84. Losartan recently discontinued   Abdominal pain: he see's Dr. Myrtie Neither next week to discuss GI issues. Of note, pre TAVR CT showed a epigastric ventral hernia    Medication Adjustments/Labs and Tests Ordered: Current medicines are reviewed at length with the patient today.  Concerns regarding medicines are outlined above.  Medication changes, Labs and Tests ordered today are listed in the Patient Instructions below. Patient Instructions  Medication Instructions:  Stop clopidogrel (Plavix) after January 02, 2019  If you need a refill on your cardiac medications before your next appointment, please call your pharmacy.   Lab work: none If you have labs (blood work) drawn today and your tests are completely normal, you will receive your results only by: Marland Kitchen MyChart Message (if you have MyChart) OR . A paper copy in the mail If you have any lab test that is abnormal or we need to change your treatment, we will call you to review  the results.  Testing/Procedures: IN ONE YEAR: Your physician has requested that you have an echocardiogram. Echocardiography is a painless test that uses sound waves to create images of your heart. It provides your doctor with information about the size and shape of your heart and how well your heart's chambers and valves are working. This procedure takes approximately one hour. There are no restrictions for this procedure.   Follow-Up: Your physician recommends that you schedule an appointment with Dr. Swaziland in 3-4 months.  Your physician wants you to follow-up in: one year with Cline Crock, PA-C.  You will receive a reminder letter in the mail two months in advance. If you don't receive a letter, please call our office to  schedule the follow-up appointment.  Any Other Special Instructions Will Be Listed Below (If Applicable).       [name] [name], Loe, PA-C  08/09/2018 9:36 PM    [facility] [address] [place], [place], [place]  [address] Phone: [phone]; Fax: [phone]

## 2018-08-08 ENCOUNTER — Encounter: Payer: Self-pay | Admitting: Gastroenterology

## 2018-08-09 ENCOUNTER — Encounter: Payer: Self-pay | Admitting: Physician Assistant

## 2018-08-09 ENCOUNTER — Other Ambulatory Visit: Payer: Self-pay | Admitting: Physician Assistant

## 2018-08-09 ENCOUNTER — Ambulatory Visit (INDEPENDENT_AMBULATORY_CARE_PROVIDER_SITE_OTHER): Payer: Medicare Other | Admitting: Physician Assistant

## 2018-08-09 ENCOUNTER — Other Ambulatory Visit: Payer: Self-pay | Admitting: *Deleted

## 2018-08-09 ENCOUNTER — Ambulatory Visit (HOSPITAL_COMMUNITY): Payer: Medicare Other | Attending: Internal Medicine

## 2018-08-09 VITALS — BP 128/74 | HR 91 | Ht 69.0 in | Wt 239.0 lb

## 2018-08-09 DIAGNOSIS — N189 Chronic kidney disease, unspecified: Secondary | ICD-10-CM

## 2018-08-09 DIAGNOSIS — Z952 Presence of prosthetic heart valve: Secondary | ICD-10-CM

## 2018-08-09 DIAGNOSIS — R109 Unspecified abdominal pain: Secondary | ICD-10-CM | POA: Diagnosis not present

## 2018-08-09 DIAGNOSIS — I10 Essential (primary) hypertension: Secondary | ICD-10-CM | POA: Diagnosis not present

## 2018-08-09 MED ORDER — CLOPIDOGREL BISULFATE 75 MG PO TABS: 75.0000 mg | ORAL_TABLET | Freq: Every day | ORAL | 4 refills | Status: DC

## 2018-08-09 MED ORDER — PERFLUTREN LIPID MICROSPHERE
1.0000 mL | INTRAVENOUS | Status: AC | PRN
  Administered 2018-08-09: 2 mL via INTRAVENOUS

## 2018-08-09 NOTE — Patient Instructions (Signed)
Medication Instructions:  Stop clopidogrel (Plavix) after January 02, 2019  If you need a refill on your cardiac medications before your next appointment, please call your pharmacy.   Lab work: none If you have labs (blood work) drawn today and your tests are completely normal, you will receive your results only by: Marland Kitchen MyChart Message (if you have MyChart) OR . A paper copy in the mail If you have any lab test that is abnormal or we need to change your treatment, we will call you to review the results.  Testing/Procedures: IN ONE YEAR: Your physician has requested that you have an echocardiogram. Echocardiography is a painless test that uses sound waves to create images of your heart. It provides your doctor with information about the size and shape of your heart and how well your heart's chambers and valves are working. This procedure takes approximately one hour. There are no restrictions for this procedure.   Follow-Up: Your physician recommends that you schedule an appointment with Dr. Swaziland in 3-4 months.  Your physician wants you to follow-up in: one year with Cline Crock, PA-C.  You will receive a reminder letter in the mail two months in advance. If you don't receive a letter, please call our office to schedule the follow-up appointment.  Any Other Special Instructions Will Be Listed Below (If Applicable).

## 2018-08-15 ENCOUNTER — Encounter (INDEPENDENT_AMBULATORY_CARE_PROVIDER_SITE_OTHER): Payer: Self-pay

## 2018-08-15 ENCOUNTER — Ambulatory Visit: Payer: Medicare Other | Admitting: Gastroenterology

## 2018-08-15 ENCOUNTER — Encounter: Payer: Self-pay | Admitting: Gastroenterology

## 2018-08-15 VITALS — BP 138/60 | HR 88 | Ht 67.0 in | Wt 242.2 lb

## 2018-08-15 DIAGNOSIS — K439 Ventral hernia without obstruction or gangrene: Secondary | ICD-10-CM | POA: Diagnosis not present

## 2018-08-15 DIAGNOSIS — R1010 Upper abdominal pain, unspecified: Secondary | ICD-10-CM | POA: Diagnosis not present

## 2018-08-15 DIAGNOSIS — R110 Nausea: Secondary | ICD-10-CM | POA: Diagnosis not present

## 2018-08-15 NOTE — Patient Instructions (Signed)
If you are age 82 or older, your body mass index should be between 23-30. Your Body mass index is 37.94 kg/m. If this is out of the aforementioned range listed, please consider follow up with your Primary Care Provider.  If you are age 43 or younger, your body mass index should be between 19-25. Your Body mass index is 37.94 kg/m. If this is out of the aformentioned range listed, please consider follow up with your Primary Care Provider.   Samples of this drug were given to the patient, Lot Number [id number] exp 09-2020  Take 2 by mouth once every morning.  It was a pleasure to see you today!  Dr. Myrtie Neither

## 2018-08-15 NOTE — Progress Notes (Signed)
[facility] Consult Note:  History: [name] [name] 08/15/2018  Referring physician: Jarome Matin, MD  Reason for consult/chief complaint: Nausea and Abdominal Pain (intermittent sharp pain in upper abd x 1 year or so)   Subjective  HPI:  This is a very pleasant 85 year old man referred by primary care for years of intermittent upper abdominal pain and daily nausea.  He is a poor historian, not know how long he has had the pain.  He figures it is probably been for years, it is intermittent, brief and sharp, glides to the mid upper abdomen.  He wonders if it may have something to do with an abdominal wall hernia. He has nausea every day, characterizes it as "mild" not feel as if he will vomit.  He thinks it reduces his oral intake he does not feel hungry to later in the day.  He has not had weight loss, dysphagia, vomiting, altered bowel habits or rectal bleeding.   Office visit with Dr. Willis Modena at [facility] September 2014 for chronic nausea.  Reportedly normal LFTs, ultrasound, HIDA scan during EF 14%, MRI and CT scan.  Surgical consult and reportedly did not think about it was the cause of nausea. Colonoscopy 2004 revealed diverticulosis and was otherwise normal.  He was treated with Prilosec, gastric emptying study was ordered.  Some primary care notes reviewed and are getting diabetic neuropathy chronic kidney disease.  He has nonocclusive coronary disease, aortic stenosis and underwent TAVR on 07/04/2018.  Last hemoglobin A1c noted to be 5.6 PCP note.  ROS:  Review of Systems  Constitutional: Positive for fatigue. Negative for appetite change and unexpected weight change.  HENT: Negative for mouth sores and voice change.   Eyes: Negative for pain and redness.  Respiratory: Positive for shortness of breath. Negative for cough.   Cardiovascular: Negative for chest pain and palpitations.  Genitourinary: Negative for dysuria and hematuria.  Musculoskeletal:  Positive for arthralgias. Negative for myalgias.  Skin: Negative for pallor and rash.  Neurological: Negative for weakness and headaches.  Hematological: Negative for adenopathy.     Past Medical History: Past Medical History:  Diagnosis Date  . Arthritis of back   . BPH (benign prostatic hyperplasia)   . CKD (chronic kidney disease) stage 3, GFR 30-59 ml/min (HCC)   . Colon polyps   . Diabetes mellitus, type 2 (HCC)   . Diabetic nephropathy (HCC)   . Diverticulosis   . ED (erectile dysfunction)   . Gallstones   . GERD (gastroesophageal reflux disease)   . Glaucoma   . Hiatal hernia   . History of gout   . History of hiatal hernia   . Hx of urinary tract infection   . Hypercholesterolemia   . Hypertension   . Left groin hematoma 07/04/2018  . Osteoarthritis    left knee  . Pneumonia   . PVD (peripheral vascular disease) (HCC)   . 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 Severe aortic stenosis   . Sleep apnea   . Sleep apnea, obstructive      Past Surgical History: Past Surgical History:  Procedure Laterality Date  . ABDOMINAL AORTIC ANEURYSM REPAIR  1995  . APPENDECTOMY    . CATARACT EXTRACTION  2003  . FEMORAL-POPLITEAL BYPASS GRAFT     left  . INTRAOPERATIVE TRANSTHORACIC ECHOCARDIOGRAM  07/04/2018   Procedure: INTRAOPERATIVE TRANSTHORACIC ECHOCARDIOGRAM;  Surgeon: Excell Seltzer,  Casimiro Needle, MD;  Location: [facility] OR;  Service: Open Heart Surgery;;  . LUMBAR SPINE SURGERY  03/2001  . REPLACEMENT TOTAL KNEE Bilateral 2004  . RIGHT/LEFT HEART CATH AND CORONARY ANGIOGRAPHY N/A 06/13/2018   Procedure: RIGHT/LEFT HEART CATH AND CORONARY ANGIOGRAPHY;  Surgeon: Sw[place]il[place], Peter M, MD;  Location: [facility] INVASIVE CV LAB;  Service: Cardiovascular;  Laterality: N/A;  . TEE WITHOUT CARDIOVERSION  07/04/2018   Procedure: TRANSESOPHAGEAL ECHOCARDIOGRAM (TEE);  Surgeon:  Tonny Bollman, MD;  Location: [facility] OR;  Service: Open Heart Surgery;;  . TOTAL KNEE ARTHROPLASTY     bilateral  . TRANSCATHETER AORTIC VALVE REPLACEMENT, TRANSFEMORAL  07/04/2018  . TRANSCATHETER AORTIC VALVE REPLACEMENT, TRANSFEMORAL N/A 07/04/2018   Procedure: TRANSCATHETER AORTIC VALVE REPLACEMENT, TRANSFEMORAL;  Surgeon: Tonny Bollman, MD;  Location: [facility] OR;  Service: Open Heart Surgery;  Laterality: N/A;  . VASECTOMY       Family History: Family History  Problem Relation Age of Onset  . Heart attack Mother   . Alzheimer's disease Father   . Pancreatic cancer Sister     Social History: Social History   Socioeconomic History  . Marital status: Married    Spouse name: Not on file  . Number of children: 4  . Years of education: Not on file  . Highest education level: Not on file  Occupational History  . 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 Financial resource strain: Not on file  . Food insecurity:    Worry: Not on file    Inability: Not on file  . Transportation needs:    Medical: Not on file    Non-medical: Not on file  Tobacco Use  . Smoking status: Former Smoker    Packs/day: 2.00    Years: 30.00    Pack years: 60.00    Types: Cigarettes    Last attempt to quit: 11/04/1984    Years since quitting: 33.8  . Smokeless tobacco: Never Used  Substance and Sexual Activity  . Alcohol use: Yes    Alcohol/week: 0.0 standard drinks    Comment: rarely  . Drug use: No  . Sexual activity: Not on file  Lifestyle  . Physical activity:    Days per week: Not on file    Minutes per session: Not on file  . Stress: Not on file  Relationships  . Social connections:    Talks on phone: Not on file    Gets together: Not on file    Attends religious service: Not on file    Active member of club or organization: Not on file    Attends meetings of clubs or organizations: Not on file    Relationship status: Not on file  Other Topics  Concern  . Not on file  Social History Narrative  . Not on file    Allergies: Allergies  Allergen Reactions  . Indocin [Indomethacin] Other (See Comments)    Antiinflammatory medication for gout ? Indocin > black out per patient ? SYNCOPE ?    Outpatient Meds: Current Outpatient Medications  Medication Sig Dispense Refill  . acetaminophen (TYLENOL) 325 MG tablet Take 2 tablets (650 mg total) by mouth every 6 (six) hours as needed for mild pain.    Marland Kitchen allopurinol (ZYLOPRIM) 300 MG tablet Take 300 mg by mouth daily.     Marland Kitchen amLODipine (NORVASC) 10 MG tablet Take 1 tablet (10 mg total) by mouth daily. 90 tablet 3  . amoxicillin (AMOXIL) 500 MG tablet Take 4  tablets (2,000 mg) one hour prior to all dental appointments. 8 tablet 6  . aspirin 81 MG tablet Take 81 mg by mouth daily.    Marland Kitchen atorvastatin (LIPITOR) 80 MG tablet Take 1 tablet (80 mg total) by mouth daily. 90 tablet 3  . carboxymethylcellulose (REFRESH PLUS) 0.5 % SOLN Place 1 drop into both eyes daily as needed (dry eyes).    . clopidogrel (PLAVIX) 75 MG tablet Take 1 tablet (75 mg total) by mouth daily with breakfast. 30 tablet 4  . dorzolamide-timolol (COSOPT) 22.3-6.8 MG/ML ophthalmic solution Place 1 drop into the left eye 2 (two) times daily.    . hydrocortisone cream 1 % Apply 1 application topically daily as needed for itching.    . insulin detemir (LEVEMIR) 100 UNIT/ML injection Inject 34 Units into the skin every morning.     . Multiple Vitamin (MULTIVITAMIN WITH MINERALS) TABS tablet Take 2 tablets by mouth daily.    . nitroGLYCERIN (NITROSTAT) 0.4 MG SL tablet PLACE 1 TABLET UNDER THE TONGUE AS NEEDED FOR CHEST PAIN  12  . pioglitazone (ACTOS) 15 MG tablet Take 15 mg by mouth daily.      No current facility-administered medications for this visit.       ___________________________________________________________________ Objective   Exam:  BP 138/60 (BP Location: Left Arm, Patient Position: Sitting, Cuff Size:  Normal)   Pulse 88   Ht  (1.702 m)   Wt 242 lb 4 oz (109.9 kg)   BMI 37.94 kg/m    General: Somewhat feeble elderly man walking stooped over with aid of a walker.  Gets on exam table slowly but without assistance.  Alert conversational and appropriate.  Eyes: sclera anicteric, no redness  ENT: oral mucosa moist without lesions, no cervical or supraclavicular lymphadenopathy  CV: RRR with soft systolic murmur, V4/U9, no JVD, no peripheral edema  Resp: clear to auscultation bilaterally, normal RR and effort noted  GI: Morbidly obese, abdomen soft, +tenderness over multifocal ventral hernia palpable bowel peristalsis, active bowel sounds normal character.  Skin; warm and dry, no rash or jaundice noted  Neuro: awake, alert and oriented x 3. Normal gross motor function and fluent speech  Labs:  Primary care records include blood work from 07/06/2018 with WBC 8.9, hemoglobin 10.5, hematocrit 23, MCV 107, platelets 65 07/13/2018, WBC 7.9 hemoglobin 10.0, MCV 98, platelets 104 There is mention in phone notes of" creatinine improving from admission but still elevated", no chemistry panel is included   CBC Latest Ref Rng & Units 07/13/2018 07/07/2018 07/06/2018  WBC 3.4 - 10.8 x10E3/uL 7.9 7.4 8.9  Hemoglobin 13.0 - 17.7 g/dL 10.0(L) 9.1(L) 7.5(L)  Hematocrit 37.5 - 51.0 % 30.0(L) 28.0(L) 23.4(L)  Platelets 150 - 450 x10E3/uL 104(L) 61(L) 65(L)   CMP Latest Ref Rng & Units 07/13/2018 07/07/2018 07/06/2018  Glucose 65 - 99 mg/dL 95 91 91  BUN 8 - 27 mg/dL 81(X) 91(Y) 78(G)  Creatinine 0.76 - 1.27 mg/dL 9.56(O) 1.30(Q) 6.57(Q)  Sodium 134 - 144 mmol/L 143 140 139  Potassium 3.5 - 5.2 mmol/L 4.6 3.9 4.1  Chloride 96 - 106 mmol/L 106 111 112(H)  CO2 20 - 29 mmol/L 21 22 19(L)  Calcium 8.6 - 10.2 mg/dL 9.1 4.6(N) 8.3(L)  Total Protein 6.5 - 8.1 g/dL - - -  Total Bilirubin 0.3 - 1.2 mg/dL - - -  Alkaline Phos 38 - 126 U/L - - -  AST 15 - 41 U/L - - -  ALT 0 - 44 U/L - - -  He  appears to have had a postoperative increase in creatinine that slowly improved prior to discharge.  Radiologic Studies:  Post TAVR echocardiogram 08/09/2018 technically limited due to poor acoustic windows and obesity.  LV ejection fraction at least 50%, TAVR functioning normally.  Last EKG QtC 512  CTAP (noncontrast) after TAVR:  IMPRESSION: 1. Ill-defined hemorrhage in the postoperative left groin with the largest discrete pocket measuring 4 x 2 cm on axial slices. No retroperitoneal extension to explain history of abdominal distention. 2. Midline hernias containing fat and bowel which may contribute to abdominal distension. 3. Cirrhosis. 4. Cholelithiasis.  Assessment: Encounter Diagnoses  Name Primary?  . Nausea without vomiting Yes  . Ventral hernia without obstruction or gangrene   . Upper abdominal pain     Many years of nonspecific low-grade nausea, extensive prior unrevealing work-up.  I think upper endoscopy would be of low yield and certainly high risk given his age and condition. He has tenderness over the abdominal hernias and I think this is the source of the pain.  It is of unclear relation to the nausea. I would not give him any prescription antiemetics due to the risk of potential side effects or cardiac arrhythmia risk.  Plan:  I gave him samples of IB Gard (time-released peppermint oil OTC) every morning.  I would not perform any further tests, I would manage this expectantly.  He is certainly not a surgical candidate for his hernias.  Return to primary care and see me as needed.  Thank you for the courtesy of this consult.  Please call me with any questions or concerns.  Total time 45 minutes, over half spent face-to-face with patient.  Extensive chart review due to complex medical history.  [name] [name]  CC: Referring provider noted above

## 2018-08-18 ENCOUNTER — Ambulatory Visit (INDEPENDENT_AMBULATORY_CARE_PROVIDER_SITE_OTHER): Payer: Medicare Other | Admitting: Internal Medicine

## 2018-08-18 ENCOUNTER — Encounter: Payer: Self-pay | Admitting: Thoracic Surgery (Cardiothoracic Vascular Surgery)

## 2018-09-04 ENCOUNTER — Other Ambulatory Visit: Payer: Self-pay

## 2018-09-04 MED ORDER — CLOPIDOGREL BISULFATE 75 MG PO TABS: 75.0000 mg | ORAL_TABLET | Freq: Every day | ORAL | 0 refills | Status: DC

## 2018-11-10 NOTE — Progress Notes (Signed)
Virtual Visit via Telephone Note   This visit type was conducted due to national recommendations for restrictions regarding the COVID-19 Pandemic (e.g. social distancing) in an effort to limit this patient's exposure and mitigate transmission in our community.  Due to his co-morbid illnesses, this patient is at least at moderate risk for complications without adequate follow up.  This format is felt to be most appropriate for this patient at this time.  The patient did not have access to video technology/had technical difficulties with video requiring transitioning to audio format only (telephone).  All issues noted in this document were discussed and addressed.  No physical exam could be performed with this format.  Please refer to the patient's chart for his  consent to telehealth for [facility].   Date:  11/10/2018   ID:  [name] [name], DOB 02-03-1932, MRN [id number]  Patient Location: Home Provider Location: Home  PCP:  Jarome Matin, MD  Cardiologist:  Rafaella Kole [place] MD Electrophysiologist:  None   Evaluation Performed:  Follow-Up Visit  Chief Complaint:  Follow up s/p TAVR  History of Present Illness:    [name] [name] is a 85 y.o. male with with a history of IDDM, GERD with hiatal hernia, cholelithiasis, diverticulosis, ventral incisional hernia, hypertension,OSA on CPAP,peripheral arterial disease, remote history of previous aortobiiliac grafting for abdominal aortic aneurysm and severe AS s/p TAVR (07/04/18) who presents to clinic for follow up.   He was seen initially last year for pre op evaluation for possible cholecystectomy. Patient is morbidly obese and has been very sedentary for many years. He has very limited physical activity. He admits to a long history of exertional shortness of breath. He has had off-and-on problems with abdominal pain and nausea for the past year and a half. Gallbladder ultrasound revealed cholelithiasis without evidence of cholecystitis. Upper GI  barium contrast study was unrevealing. The patient eventually underwent a gallbladder nuclear scan which revealed a low gallbladder ejection fraction. He was found to have severe aortic stenosis.  L/RHC showed mild non obstructive CAD except for a 80% stenosis of her RV marginal branch. Medical therapy was recommended for his CAD.   He underwent successful TAVR with a 26 mm Edwards Sapien 3 THV.  Post op ecg showed a new LBBB. His hospital course was complicated by labile blood pressure, AKI with creatinine as high as 2.6 and acute blood loss anemia 2/2 groin hematoma. Losartan was held and he was transfused. Post op echo showed LVEF 55-60% with normally functioning TAVR valve. He was discharged on aspirin and plavix. Further adjustments made in BP and statin medications as outpatient.   He is doing well now. The only complaint is of his GI complaints. No change. Denies any chest pain or SOB. No palpitations or dizziness. His home BP monitor is not working. Notes his twin brother died with COVID 44.  The patient does not have symptoms concerning for COVID-19 infection (fever, chills, cough, or new shortness of breath).    Past Medical History:  Diagnosis Date   Arthritis of back    BPH (benign prostatic hyperplasia)    CKD (chronic kidney disease) stage 3, GFR 30-59 ml/min (HCC)    Colon polyps    Diabetes mellitus, type 2 (HCC)    Diabetic nephropathy (HCC)    Diverticulosis    ED (erectile dysfunction)    Gallstones    GERD (gastroesophageal reflux disease)    Glaucoma    Hiatal hernia    History of gout  History of hiatal hernia    Hx of urinary tract infection    Hypercholesterolemia    Hypertension    Left groin hematoma 07/04/2018   Osteoarthritis    left knee   Pneumonia    PVD (peripheral vascular disease) (HCC)    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   26 mm Edwards Sapien 3 transcatheter heart valve placed via percutaneous  right transfemoral approach    Severe aortic stenosis    Sleep apnea    Sleep apnea, obstructive    Past Surgical History:  Procedure Laterality Date   ABDOMINAL AORTIC ANEURYSM REPAIR  1995   APPENDECTOMY     CATARACT EXTRACTION  2003   FEMORAL-POPLITEAL BYPASS GRAFT     left   INTRAOPERATIVE TRANSTHORACIC ECHOCARDIOGRAM  07/04/2018   Procedure: INTRAOPERATIVE TRANSTHORACIC ECHOCARDIOGRAM;  Surgeon: Tonny Bollmanooper, Michael, MD;  [facility];  Service: Open Heart Surgery;;   LUMBAR SPINE SURGERY  03/2001   REPLACEMENT TOTAL KNEE Bilateral 2004   RIGHT/LEFT HEART CATH AND CORONARY ANGIOGRAPHY N/A 06/13/2018   Procedure: RIGHT/LEFT HEART CATH AND CORONARY ANGIOGRAPHY;  Surgeon: SwazilandJordan, Pearlean Sabina M, MD;  [facility];  Service: Cardiovascular;  Laterality: N/A;   TEE WITHOUT CARDIOVERSION  07/04/2018   Procedure: TRANSESOPHAGEAL ECHOCARDIOGRAM (TEE);  Surgeon: Tonny Bollmanooper, Michael, MD;  [facility];  Service: Open Heart Surgery;;   TOTAL KNEE ARTHROPLASTY     bilateral   TRANSCATHETER AORTIC VALVE REPLACEMENT, TRANSFEMORAL  07/04/2018   TRANSCATHETER AORTIC VALVE REPLACEMENT, TRANSFEMORAL N/A 07/04/2018   Procedure: TRANSCATHETER AORTIC VALVE REPLACEMENT, TRANSFEMORAL;  Surgeon: Tonny Bollmanooper, Michael, MD;  [facility];  Service: Open Heart Surgery;  Laterality: N/A;   VASECTOMY       No outpatient medications have been marked as taking for the 11/13/18 encounter (Appointment) with SwazilandJordan, Adah Stoneberg M, MD.     Allergies:   Indocin [indomethacin]   Social History   Tobacco Use   Smoking status: Former Smoker    Packs/day: 2.00    Years: 30.00    Pack years: 60.00    Types: Cigarettes    Last attempt to quit: 11/04/1984    Years since quitting: 34.0   Smokeless tobacco: Never Used  Substance Use Topics   Alcohol use: Yes    Alcohol/week: 0.0 standard drinks    Comment: rarely   Drug use: No     Family Hx: The patient's family history includes Alzheimer's  disease in his father; Heart attack in his mother; Pancreatic cancer in his sister.  ROS:   Please see the history of present illness.    All other systems reviewed and are negative.   Prior CV studies:   The following studies were reviewed today:  RIGHT/LEFT HEART CATH AND CORONARY ANGIOGRAPHY  Conclusion     Prox LAD lesion is 25% stenosed.  Ost RCA lesion is 40% stenosed.  Acute Mrg lesion is 80% stenosed.  LV end diastolic pressure is normal.  There is severe aortic valve stenosis.   1. Nonobstructive CAD except for 80% RV marginal branch 2. Severe calcific aortic stenosis. Mean gradient 45 mm Hg, peak to peak 66 mm Hg, AV area 0.9 cm squared with index 0.4 3. Normal right heart and LV filling pressures 4. Normal cardiac output.  Plan: will refer to our heart valve team for consideration of AVR.      Echo 07/20/18 IMPRESSIONS   1. The left ventricle has low normal systolic function, with an ejection fraction of 50-55%. The cavity  size was normal. Left ventricular diastology could not be evaluated due to nondiagnostic images. 2. The right ventricle has normal systolic function. The cavity was normal. There is no increase in right ventricular wall thickness. 3. The mitral valve is normal in structure. 4. The tricuspid valve is normal in structure. 5. A 49mm Edwards Sapien bioprosthetic aortic valve (TAVR) valve is present in the aortic position. Procedure Date: 07/04/18. Trivial posterior paravalvular regurgitation, no evidence of prosthetic regurgitation. 6. AV Mean systolic Grad: 11.0 mmHg.  Labs/Other Tests and Data Reviewed:    EKG:  No ECG reviewed.  Recent Labs: 07/03/2018: ALT 13; B Natriuretic Peptide 102.7 07/05/2018: Magnesium 2.0 07/13/2018: BUN 40; Creatinine, Ser 1.84; Hemoglobin 10.0; Platelets 104; Potassium 4.6; Sodium 143   Recent Lipid Panel No results found for: CHOL, TRIG, HDL, CHOLHDL, LDLCALC, LDLDIRECT  Labs dated 04/17/18:  cholesterol 182, triglycerides 169, HDL 36, LDL 112.    Wt Readings from Last 3 Encounters:  08/15/18 242 lb 4 oz (109.9 kg)  08/09/18 239 lb (108.4 kg)  07/13/18 242 lb 12.8 oz (110.1 kg)     Objective:    Vital Signs:  There were no vitals taken for this visit.   VITAL SIGNS:  reviewed  ASSESSMENT & PLAN:    1. Severe AS s/p TAVR January 2020: good recovery.  He has NYHA class II symptoms but is pretty sedentary. SBE prophylaxis recommended.  Plavix can be discontinued after 6 months of therapy (12/2018).   2. HTN: currently on amlodipine  3. CKD stage IV: creat baseline 1.5-1.84. Losartan has been discontinued   4. Abdominal pain: seen by Dr Myrtie Neither. Conservative therapy recommended. Did not recommend surgery for his GB or ventral hernia.   5. CAD. Nonobstructive except for RV marginal branch  COVID-19 Education: The signs and symptoms of COVID-19 were discussed with the patient and how to seek care for testing (follow up with PCP or arrange E-visit).  The importance of social distancing was discussed today.  Time:   Today, I have spent 10 minutes with the patient with telehealth technology discussing the above problems.     Medication Adjustments/Labs and Tests Ordered: Current medicines are reviewed at length with the patient today.  Concerns regarding medicines are outlined above.   Tests Ordered: No orders of the defined types were placed in this encounter.   Medication Changes: No orders of the defined types were placed in this encounter.   Disposition:  Follow up in 6 month(s)  Signed, Jais Demir [place], MD  11/10/2018 1:17 PM    [facility]

## 2018-11-13 ENCOUNTER — Telehealth (INDEPENDENT_AMBULATORY_CARE_PROVIDER_SITE_OTHER): Payer: Medicare Other | Admitting: Cardiology

## 2018-11-13 ENCOUNTER — Encounter: Payer: Self-pay | Admitting: Cardiology

## 2018-11-13 VITALS — Ht 70.0 in | Wt 260.0 lb

## 2018-11-13 DIAGNOSIS — Z952 Presence of prosthetic heart valve: Secondary | ICD-10-CM

## 2018-11-13 DIAGNOSIS — I10 Essential (primary) hypertension: Secondary | ICD-10-CM | POA: Diagnosis not present

## 2018-11-13 DIAGNOSIS — I739 Peripheral vascular disease, unspecified: Secondary | ICD-10-CM

## 2018-11-13 DIAGNOSIS — I350 Nonrheumatic aortic (valve) stenosis: Secondary | ICD-10-CM

## 2018-11-13 NOTE — Patient Instructions (Signed)

## 2018-12-07 ENCOUNTER — Other Ambulatory Visit: Payer: Self-pay | Admitting: Physician Assistant

## 2019-05-23 ENCOUNTER — Other Ambulatory Visit: Payer: Self-pay | Admitting: Physician Assistant

## 2019-05-23 DIAGNOSIS — Z952 Presence of prosthetic heart valve: Secondary | ICD-10-CM

## 2019-06-01 ENCOUNTER — Other Ambulatory Visit: Payer: Self-pay

## 2019-06-01 ENCOUNTER — Ambulatory Visit (HOSPITAL_COMMUNITY): Payer: Medicare Other | Attending: Cardiology

## 2019-06-01 DIAGNOSIS — Z952 Presence of prosthetic heart valve: Secondary | ICD-10-CM | POA: Insufficient documentation

## 2019-06-01 MED ORDER — PERFLUTREN LIPID MICROSPHERE
1.0000 mL | INTRAVENOUS | Status: AC | PRN
  Administered 2019-06-01: 2 mL via INTRAVENOUS

## 2019-06-01 NOTE — Progress Notes (Signed)
Date:  06/04/2019   ID:  [name] Oharaed Leanos, DOB 1931-11-23, MRN [id number]   PCP:  Jarome MatinPaterson, Daniel, MD  Cardiologist:  Zaylon Bossier SwazilandJordan MD Electrophysiologist:  None   Evaluation Performed:  Follow-Up Visit  Chief Complaint:  Follow up s/p TAVR  History of Present Illness:    [name] [name] is a 85 y.o. male with with a history of IDDM, GERD with hiatal hernia, cholelithiasis, diverticulosis, ventral incisional hernia, hypertension,OSA on CPAP,peripheral arterial disease, remote history of previous aortobiiliac grafting for abdominal aortic aneurysm and severe AS s/p TAVR (07/04/18) who presents to clinic for follow up.   He was seen initially last year for pre op evaluation for possible cholecystectomy.  He was found to have severe aortic stenosis.  L/RHC showed mild non obstructive CAD except for a 80% stenosis of the RV marginal branch. Medical therapy was recommended for his CAD.   He underwent successful TAVR 07/04/18 with a 26 mm Edwards Sapien 3 THV.  Post op ecg showed a new LBBB. His hospital course was complicated by labile blood pressure, AKI with creatinine as high as 2.6 and acute blood loss anemia 2/2 groin hematoma. Losartan was held and he was transfused. Post op echo showed LVEF 55-60% with normally functioning TAVR valve. He was discharged on aspirin and plavix. Further adjustments made in BP and statin medications as outpatient.   On follow up today he is doing well. He is still quite sedentary but is able to get up one flight of stairs using hand rails. Denies dyspnea or increased edema. He has lost weight- about 15 lbs since March. Has a localized discomfort over his left lower rib. Recent Echo done noted below.   Past Medical History:  Diagnosis Date  . Arthritis of back   . BPH (benign prostatic hyperplasia)   . CKD (chronic kidney disease) stage 3, GFR 30-59 ml/min   . Colon polyps   . Diabetes mellitus, type 2 (HCC)   . Diabetic nephropathy (HCC)   . Diverticulosis     . ED (erectile dysfunction)   . Gallstones   . GERD (gastroesophageal reflux disease)   . Glaucoma   . Hiatal hernia   . History of gout   . History of hiatal hernia   . Hx of urinary tract infection   . Hypercholesterolemia   . Hypertension   . Left groin hematoma 07/04/2018  . Osteoarthritis    left knee  . Pneumonia   . PVD (peripheral vascular disease) (HCC)   . 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 Severe aortic stenosis   . Sleep apnea   . Sleep apnea, obstructive    Past Surgical History:  Procedure Laterality Date  . ABDOMINAL AORTIC ANEURYSM REPAIR  1995  . APPENDECTOMY    . CATARACT EXTRACTION  2003  . FEMORAL-POPLITEAL BYPASS GRAFT     left  . INTRAOPERATIVE TRANSTHORACIC ECHOCARDIOGRAM  07/04/2018   Procedure: INTRAOPERATIVE TRANSTHORACIC ECHOCARDIOGRAM;  Surgeon: Tonny Bollmanooper, Michael, MD;  [facility];  Service: Open Heart Surgery;;  . LUMBAR SPINE SURGERY  03/2001  . REPLACEMENT TOTAL KNEE Bilateral 2004  . RIGHT/LEFT HEART CATH AND CORONARY ANGIOGRAPHY N/A 06/13/2018   Procedure: RIGHT/LEFT HEART CATH AND CORONARY ANGIOGRAPHY;  Surgeon: SwazilandJordan, Carrieanne Kleen M, MD;  [facility];  Service: Cardiovascular;  Laterality: N/A;  . TEE WITHOUT CARDIOVERSION  07/04/2018   Procedure: TRANSESOPHAGEAL ECHOCARDIOGRAM (  TEE);  Surgeon: Tonny Bollman, MD;  Location: [facility];  Service: Open Heart Surgery;;  . TOTAL KNEE ARTHROPLASTY     bilateral  . TRANSCATHETER AORTIC VALVE REPLACEMENT, TRANSFEMORAL  07/04/2018  . TRANSCATHETER AORTIC VALVE REPLACEMENT, TRANSFEMORAL N/A 07/04/2018   Procedure: TRANSCATHETER AORTIC VALVE REPLACEMENT, TRANSFEMORAL;  Surgeon: Tonny Bollman, MD;  Location: [facility] OR;  Service: Open Heart Surgery;  Laterality: N/A;  . VASECTOMY       Current Meds  Medication Sig  . acetaminophen (TYLENOL) 325 MG tablet Take 2  tablets (650 mg total) by mouth every 6 (six) hours as needed for mild pain.  Marland Kitchen allopurinol (ZYLOPRIM) 300 MG tablet Take 300 mg by mouth daily.   Marland Kitchen amLODipine (NORVASC) 10 MG tablet Take 1 tablet (10 mg total) by mouth daily.  Marland Kitchen amoxicillin (AMOXIL) 500 MG tablet Take 4 tablets (2,000 mg) one hour prior to all dental appointments.  Marland Kitchen aspirin 81 MG tablet Take 81 mg by mouth daily.  Marland Kitchen atorvastatin (LIPITOR) 80 MG tablet Take 1 tablet (80 mg total) by mouth daily.  . carboxymethylcellulose (REFRESH PLUS) 0.5 % SOLN Place 1 drop into both eyes daily as needed (dry eyes).  . dorzolamide-timolol (COSOPT) 22.3-6.8 MG/ML ophthalmic solution Place 1 drop into the left eye 2 (two) times daily.  . hydrocortisone cream 1 % Apply 1 application topically daily as needed for itching.  . insulin detemir (LEVEMIR) 100 UNIT/ML injection Inject 34 Units into the skin every morning.   . Multiple Vitamin (MULTIVITAMIN WITH MINERALS) TABS tablet Take 2 tablets by mouth daily.  . nitroGLYCERIN (NITROSTAT) 0.4 MG SL tablet PLACE 1 TABLET UNDER THE TONGUE AS NEEDED FOR CHEST PAIN     Allergies:   Indocin [indomethacin]   Social History   Tobacco Use  . Smoking status: Former Smoker    Packs/day: 2.00    Years: 30.00    Pack years: 60.00    Types: Cigarettes    Quit date: 11/04/1984    Years since quitting: 34.6  . Smokeless tobacco: Never Used  Substance Use Topics  . Alcohol use: Yes    Alcohol/week: 0.0 standard drinks    Comment: rarely  . Drug use: No     Family Hx: The patient's family history includes Alzheimer's disease in his father; Heart attack in his mother; Pancreatic cancer in his sister.  ROS:   Please see the history of present illness.    All other systems reviewed and are negative.   Prior CV studies:   The following studies were reviewed today:  Ecg today shows NSR rate 80. LAD. LBBB. I have personally reviewed and interpreted this study.   RIGHT/LEFT HEART CATH AND CORONARY  ANGIOGRAPHY  Conclusion     Prox LAD lesion is 25% stenosed.  Ost RCA lesion is 40% stenosed.  Acute Mrg lesion is 80% stenosed.  LV end diastolic pressure is normal.  There is severe aortic valve stenosis.   1. Nonobstructive CAD except for 80% RV marginal branch 2. Severe calcific aortic stenosis. Mean gradient 45 mm Hg, peak to peak 66 mm Hg, AV area 0.9 cm squared with index 0.4 3. Normal right heart and LV filling pressures 4. Normal cardiac output.  Plan: will refer to our heart valve team for consideration of AVR.      Echo 07/20/18 IMPRESSIONS   1. The left ventricle has low normal systolic function, with an ejection fraction of 50-55%. The cavity size was normal. Left ventricular diastology could not be evaluated  due to nondiagnostic images. 2. The right ventricle has normal systolic function. The cavity was normal. There is no increase in right ventricular wall thickness. 3. The mitral valve is normal in structure. 4. The tricuspid valve is normal in structure. 5. A 26mm Edwards Sapien bioprosthetic aortic valve (TAVR) valve is present in the aortic position. Procedure Date: 07/04/18. Trivial posterior paravalvular regurgitation, no evidence of prosthetic regurgitation. 6. AV Mean systolic Grad: 11.0 mmHg.  Echo 06/01/19: IMPRESSIONS    1. Left ventricular ejection fraction, by visual estimation, is 45 to 50%. The left ventricle has mildly decreased function. There is mildly increased left ventricular hypertrophy.  2. The left ventricle demonstrates regional wall motion abnormalities.  3. Global right ventricle has normal systolic function.The right ventricular size is normal.  4. Left atrial size was normal.  5. Right atrial size was normal.  6. The mitral valve is normal in structure. Trivial mitral valve regurgitation. No evidence of mitral stenosis.  7. The tricuspid valve is normal in structure. Tricuspid valve regurgitation is not demonstrated.   8. Aortic valve regurgitation is mild. No evidence of aortic valve sclerosis or stenosis.  9. The pulmonic valve was not well visualized. Pulmonic valve regurgitation is not visualized. 10. The inferior vena cava is normal in size with greater than 50% respiratory variability, suggesting right atrial pressure of 3 mmHg. 11. Definity used; septal hypokinesis with overall mild LV dysfunction; mild LVH; s/p TAVR with mean gradient 8 mmHg, AVA 1.3 cm2 and mild AI.  Labs/Other Tests and Data Reviewed:    EKG:  No ECG reviewed.  Recent Labs: 07/03/2018: ALT 13; B Natriuretic Peptide 102.7 07/05/2018: Magnesium 2.0 07/13/2018: BUN 40; Creatinine, Ser 1.84; Hemoglobin 10.0; Platelets 104; Potassium 4.6; Sodium 143   Recent Lipid Panel No results found for: CHOL, TRIG, HDL, CHOLHDL, LDLCALC, LDLDIRECT  Labs dated 04/17/18: cholesterol 182, triglycerides 169, HDL 36, LDL 112.    Wt Readings from Last 3 Encounters:  06/04/19 228 lb (103.4 kg)  11/13/18 260 lb (117.9 kg)  08/15/18 242 lb 4 oz (109.9 kg)     Objective:    Vital Signs:  BP 126/68   Pulse 80   Ht  (1.778 m)   Wt 228 lb (103.4 kg)   SpO2 96%   BMI 32.71 kg/m    GENERAL:  Well appearing obese WM in NAD HEENT:  PERRL, EOMI, sclera are clear. Oropharynx is clear. NECK:  No jugular venous distention, carotid upstroke brisk and symmetric, no bruits, no thyromegaly or adenopathy LUNGS:  Clear to auscultation bilaterally CHEST:  Unremarkable HEART:  RRR,  PMI not displaced or sustained,S1 and S2 within normal limits, no S3, no S4: no clicks, no rubs, no murmurs ABD:  Soft, nontender. BS +, no masses or bruits. No hepatomegaly, no splenomegaly EXT:  2 + pulses throughout, no edema, no cyanosis no clubbing SKIN:  Warm and dry.  No rashes NEURO:  Alert and oriented x 3. Cranial nerves II through XII intact. PSYCH:  Cognitively intact    ASSESSMENT & PLAN:    1. Severe AS s/p TAVR January 2020: recent Echo showed good  valve function. SBE prophylaxis recommended. Doing well.   2. LV dysfunction. NYHA class 2. Difficult to assess since he is so sedentary. EF is mildly reduced compared to prior likely due to LBBB. Not a candidate for ACEi/ARB/aldactone due to CKD.   3. CKD stage IV: creat baseline 1.5-1.84. Losartan has been discontinued. Follow up lab work in Feb with  Dr Philip Aspen.   4. Abdominal pain: seen by Dr Loletha Carrow. Conservative therapy recommended. Did not recommend surgery for his GB or ventral hernia.   5. CAD. Nonobstructive except for RV marginal branch. On statin and ASA.      Medication Adjustments/Labs and Tests Ordered: Current medicines are reviewed at length with the patient today.  Concerns regarding medicines are outlined above.   Tests Ordered: No orders of the defined types were placed in this encounter.   Medication Changes: No orders of the defined types were placed in this encounter.   Disposition:  Follow up in 6 month(s)  Signed, Brookelle Pellicane Martinique, MD  06/04/2019 2:03 PM    [facility]

## 2019-06-04 ENCOUNTER — Ambulatory Visit: Payer: Medicare Other | Admitting: Cardiology

## 2019-06-04 ENCOUNTER — Other Ambulatory Visit: Payer: Self-pay

## 2019-06-04 ENCOUNTER — Encounter: Payer: Self-pay | Admitting: Cardiology

## 2019-06-04 VITALS — BP 126/68 | HR 80 | Ht 70.0 in | Wt 228.0 lb

## 2019-06-04 DIAGNOSIS — Z952 Presence of prosthetic heart valve: Secondary | ICD-10-CM

## 2019-06-04 DIAGNOSIS — I350 Nonrheumatic aortic (valve) stenosis: Secondary | ICD-10-CM | POA: Diagnosis not present

## 2019-06-04 DIAGNOSIS — N189 Chronic kidney disease, unspecified: Secondary | ICD-10-CM

## 2019-06-04 DIAGNOSIS — I10 Essential (primary) hypertension: Secondary | ICD-10-CM | POA: Diagnosis not present

## 2019-06-04 DIAGNOSIS — I25118 Atherosclerotic heart disease of native coronary artery with other forms of angina pectoris: Secondary | ICD-10-CM

## 2019-06-04 NOTE — Addendum Note (Signed)
Addended by: Kathyrn Lass on: 06/04/2019 02:17 PM   Modules accepted: Orders

## 2019-07-16 ENCOUNTER — Other Ambulatory Visit: Payer: Self-pay | Admitting: Physician Assistant

## 2019-07-23 ENCOUNTER — Ambulatory Visit: Payer: Medicare Other | Attending: Internal Medicine

## 2019-07-23 DIAGNOSIS — Z23 Encounter for immunization: Secondary | ICD-10-CM | POA: Insufficient documentation

## 2019-07-23 NOTE — Progress Notes (Signed)
   Covid-19 Vaccination Clinic  Name:  [name] [name]    MRN: [id number] DOB: 04/14/1932  07/23/2019  Mr. [name] [name] was observed post Covid-19 immunization for 15 minutes without incidence. He was provided with Vaccine Information Sheet and instruction to access the V-Safe system.   Mr. [name] [name] was instructed to call 911 with any severe reactions post vaccine: Marland Kitchen Difficulty breathing  . Swelling of your face and throat  . A fast heartbeat  . A bad rash all over your body  . Dizziness and weakness    Immunizations Administered    Name Date Dose VIS Date Route   Pfizer COVID-19 Vaccine 07/23/2019  4:15 AM 0.3 mL 05/25/2019 Intramuscular   Manufacturer: [employer], [employer]   Lot: [id number]   NDC: [address]-0

## 2019-07-26 IMAGING — CT CT ANGIO CHEST
2 of 6 series · 14 of 46 positions shown · IV contrast (APPLIED)
Comparison: CTA of the chest, abdomen and pelvis 06/19/2018.

CLINICAL DATA: 86-year-old male with history of severe aortic
stenosis. Preprocedural study prior to potential transcatheter
aortic valve replacement (TAVR) procedure.

EXAM:
CT ANGIOGRAPHY CHEST, ABDOMEN AND PELVIS
TECHNIQUE: Multidetector CT imaging through the chest, abdomen and pelvis was
performed using the standard protocol during bolus administration of
intravenous contrast. Multiplanar reconstructed images and MIPs were
obtained and reviewed to evaluate the vascular anatomy.
CONTRAST:  100mL MON1UP-RXC IOPAMIDOL (MON1UP-RXC) INJECTION 76%

[Series 5: arterial · axial · arterial · 0.93mm/px · z∈[+883,+1495]mm · 11 of 342 slices shown]
[im 18/342  lung]
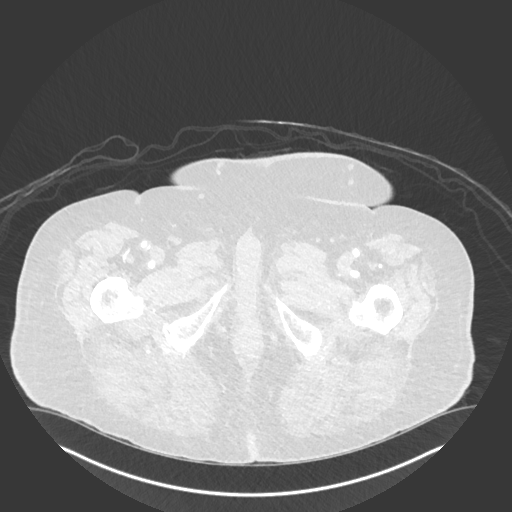
[im 54/342  soft-tissue]
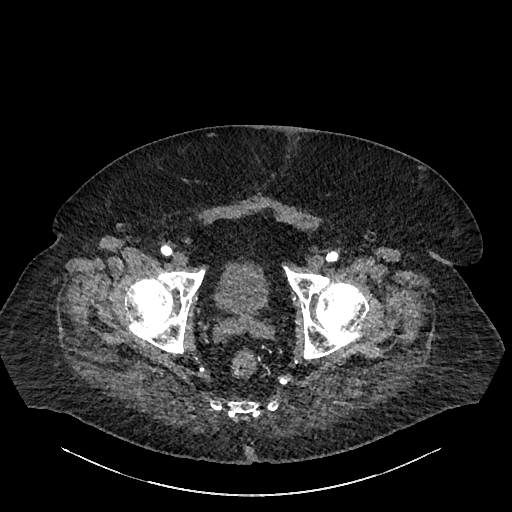
[im 90/342  lung]
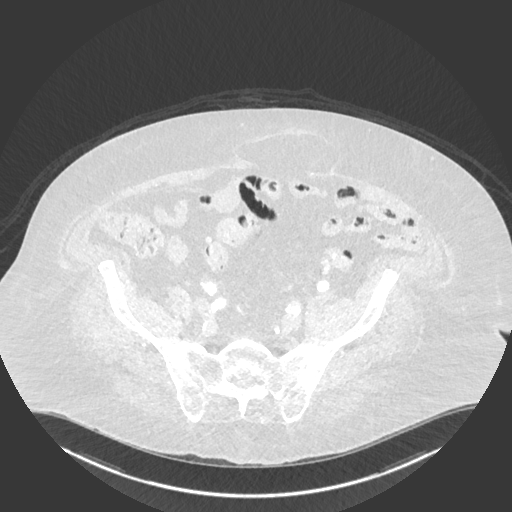
[im 108/342  soft-tissue]
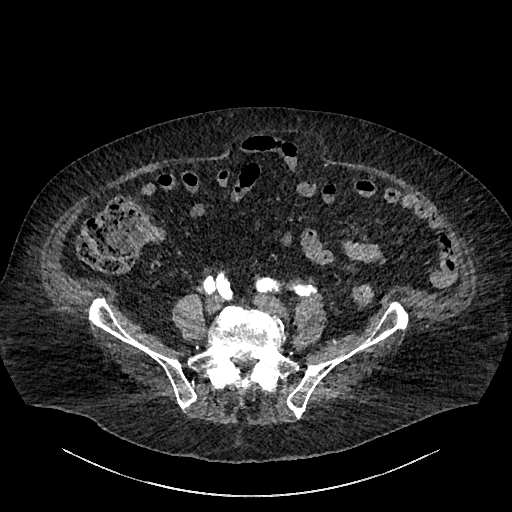
[im 144/342  lung]
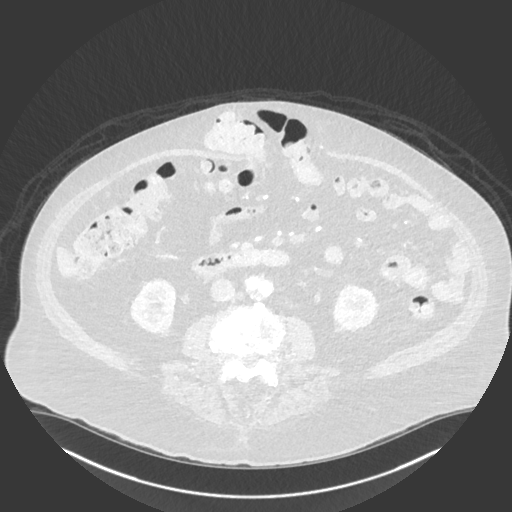
[im 180/342  soft-tissue]
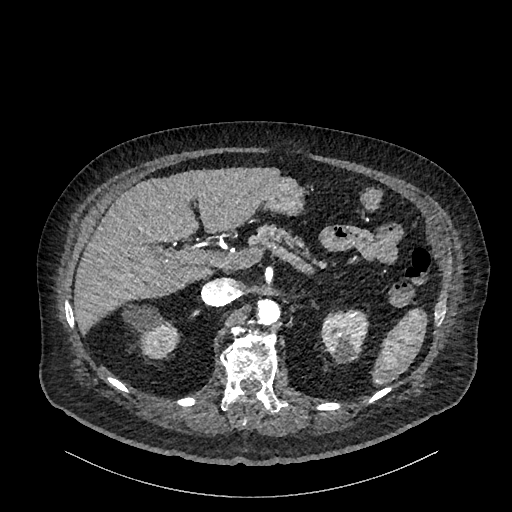
[im 198/342  lung]
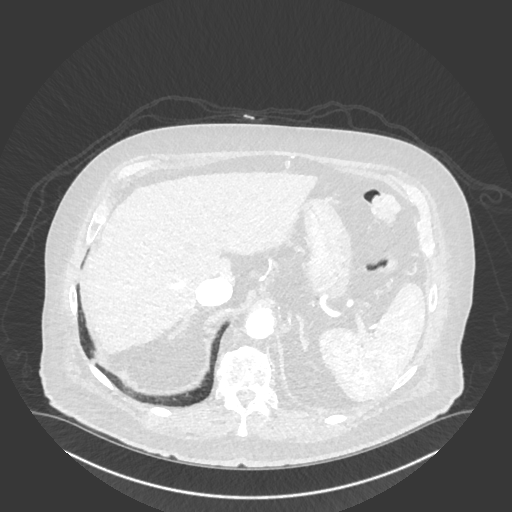
[im 234/342  soft-tissue]
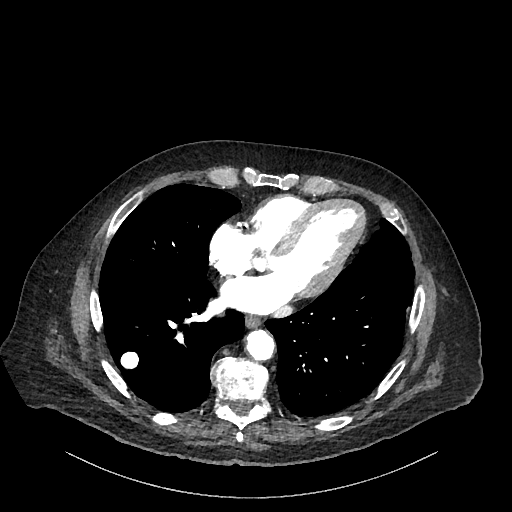
[im 252/342  lung]
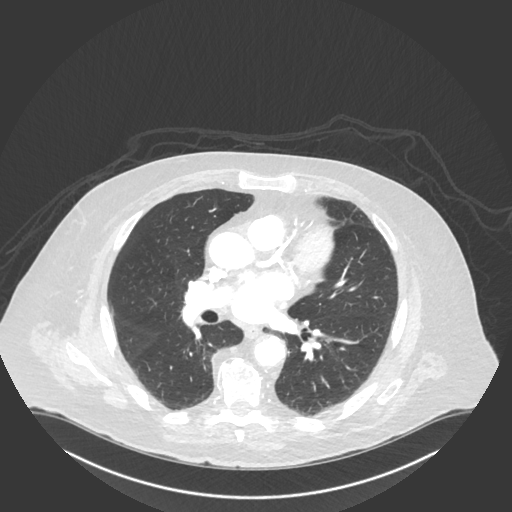
[im 288/342  soft-tissue]
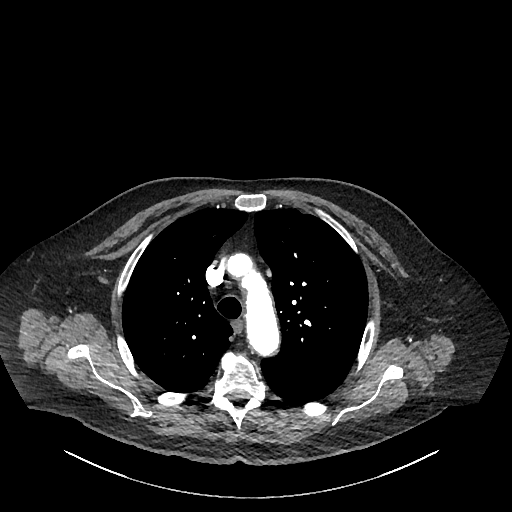
[im 324/342  lung]
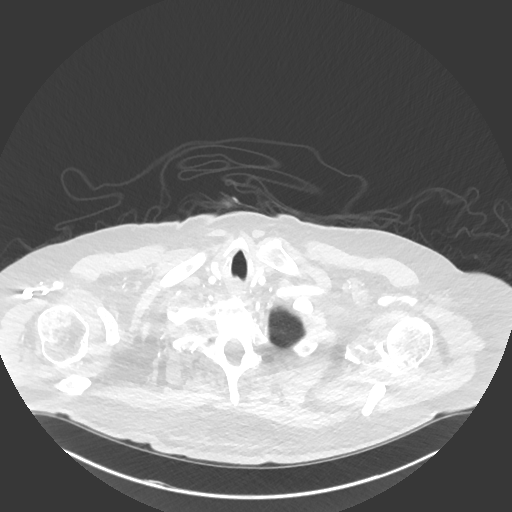

[Series 8: cor · coronal · 1.00mm/px · 3 of 179 slices shown]
[im 45/179  soft-tissue]
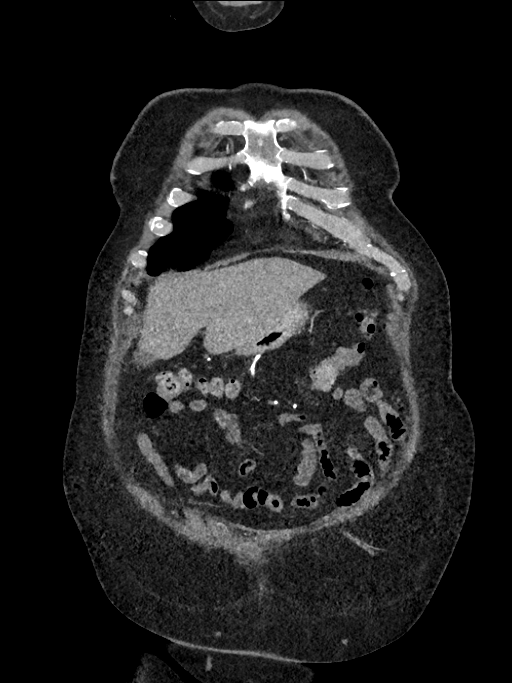
[im 90/179  soft-tissue]
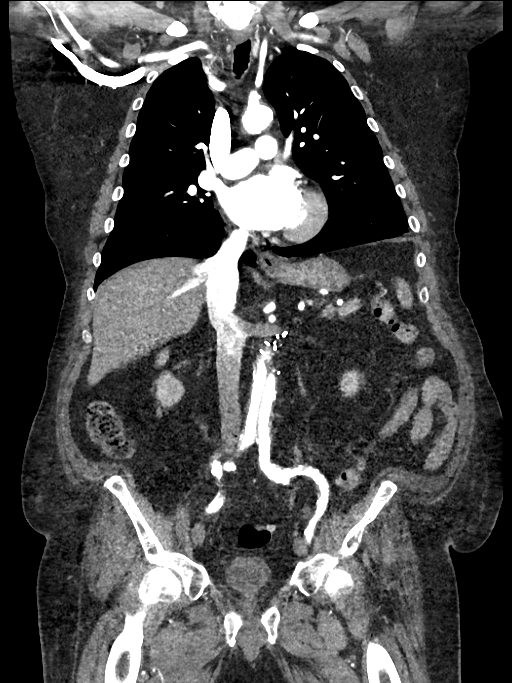
[im 134/179  soft-tissue]
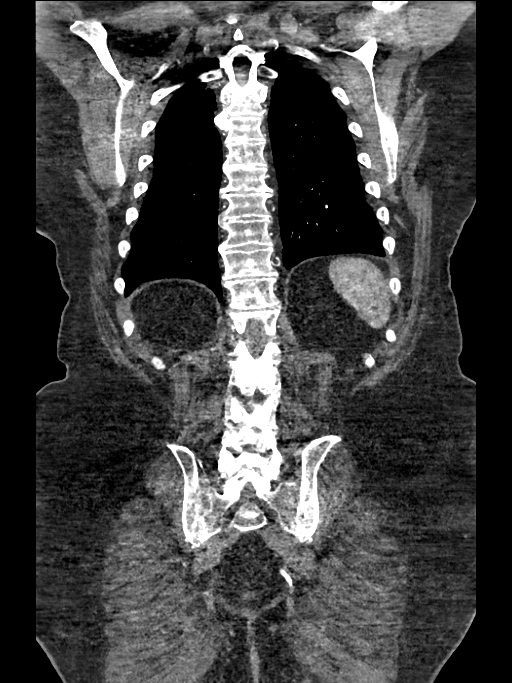

[14 of 46 positions shown; findings below may reference images not displayed]

FINDINGS: CTA CHEST FINDINGS

Cardiovascular: Heart size is normal. There is no significant
pericardial fluid, thickening or pericardial calcification. There is
aortic atherosclerosis, as well as atherosclerosis of the great
vessels of the mediastinum and the coronary arteries, including
calcified atherosclerotic plaque in the left main, left anterior
descending, left circumflex and right coronary arteries. Severe
thickening calcification of the aortic valve.

Mediastinum/Lymph Nodes: No pathologically enlarged mediastinal or
hilar lymph nodes. Small hiatal hernia. No axillary lymphadenopathy.

Lungs/Pleura: Calcified granulomas are again noted throughout the
right lung, largest of which measures up to 1.8 cm in the right
lower lobe. No other suspicious appearing pulmonary nodules or
masses are noted. No acute consolidative airspace disease. No
pleural effusions.

Musculoskeletal/Soft Tissues: There are no aggressive appearing
lytic or blastic lesions noted in the visualized portions of the
skeleton.

CTA ABDOMEN AND PELVIS FINDINGS

Hepatobiliary: Liver again has a shrunken appearance and nodular
contour, compatible with underlying cirrhosis. No suspicious cystic
or solid hepatic lesions. No intra or extrahepatic biliary ductal
dilatation. Tiny calcified gallstones lie dependently in the
gallbladder. No surrounding inflammatory changes to suggest an acute
cholecystitis at this time.

Pancreas: No pancreatic mass. No pancreatic ductal dilatation. No
pancreatic or peripancreatic fluid or inflammatory changes.

Spleen: Unremarkable.

Adrenals/Urinary Tract: Atrophy of both kidneys. Low-attenuation
lesions in both kidneys, compatible with simple cysts, largest of
which measures up to 5.5 cm in diameter in the interpolar region of
the right kidney. No hydroureteronephrosis. Urinary bladder is
nearly decompressed, but otherwise unremarkable in appearance.
Bilateral adrenal glands are normal in appearance.

Stomach/Bowel: Normal appearance of the stomach. No pathologic
dilatation of small bowel or colon. Short segment of mid transverse
colon extends into an epigastric ventral hernia. A few scattered
colonic diverticulae are noted, without surrounding inflammatory
changes to suggest an acute diverticulitis at this time.
Specifically, the inflammation noted adjacent to the proximal
descending colon seen on the prior study has resolved. The appendix
is not confidently identified and may be surgically absent.
Regardless, there are no inflammatory changes noted adjacent to the
cecum to suggest the presence of an acute appendicitis at this time.

Vascular/Lymphatic: Aortic atherosclerosis with postoperative
changes of aorto bi-iliac bypass graft which is widely patent.
Vascular findings and measurements pertinent to potential TAVR
procedure, as detailed below. Aneurysmal dilatation of the left
common femoral artery which measures up to 16 x 19 mm with a large
burden of eccentric atheromatous plaque and/or mural thrombus. No
lymphadenopathy noted in the abdomen or pelvis.

Reproductive: Prostate gland and seminal vesicles are unremarkable
in appearance.

Other: Small epigastric ventral hernia containing a short segment of
the mid transverse colon. There is also a small infraumbilical
ventral hernia containing some omental fat and short segment of mid
small bowel. No significant volume of ascites. No pneumoperitoneum.

Musculoskeletal: There are no aggressive appearing lytic or blastic
lesions noted in the visualized portions of the skeleton.

VASCULAR MEASUREMENTS PERTINENT TO TAVR:

AORTA:

Minimal Aortic 9iameter-6S x 18 mm

Severity of Aortic Calcification-mild-to-moderate

RIGHT PELVIS:

Right Common Iliac Artery -

Minimal 6iameter-YY.F x 10.3 mm

Tortuosity-mild

Calcification-moderate

Right External Iliac Artery -

Minimal 7iameter-D.Z x 3.3 mm

Tortuosity-moderate to severe

Calcification-moderate

Right Common Femoral Artery -

Minimal Miameter-7.S x 7.0 mm

Tortuosity-mild

Calcification-mild-to-moderate

LEFT PELVIS:

Left Common Iliac Artery -

Minimal Qiameter-E.K x 8.8 mm

Tortuosity-mild

Calcification-mild

Left External Iliac Artery -

Minimal Niameter-1.M x 5.7 mm

Tortuosity-moderate to severe

Calcification-moderate

Left Common Femoral Artery -

Minimal Iiameter-H.Q x 6.8 mm

Tortuosity-mild

Calcification-moderate

Comment- aneurysmal dilatation up to 16 x 19 mm with large eccentric
atheromatous plaque.

Review of the MIP images confirms the above findings.
IMPRESSION: 1. Vascular findings and measurements pertinent to potential TAVR
procedure, as detailed above.
2. Severe thickening calcification of the aortic valve, compatible
with the reported clinical history of severe aortic stenosis.
3. Resolution of inflammatory changes noted adjacent to the proximal
descending colon seen on the prior study, suggesting a resolved
diverticulitis. Extensive colonic diverticulosis is again noted.
4. Epigastric ventral hernia containing short segment of mid
transverse colon, and infraumbilical ventral hernia containing some
omental fat and short segment of mid small bowel without evidence of
bowel incarceration or obstruction at this time.
5. Cirrhosis.
6. Cholelithiasis without evidence of acute cholecystitis at this
time.
7. Additional incidental findings, as above.

## 2019-08-17 ENCOUNTER — Ambulatory Visit: Payer: Medicare Other | Attending: Internal Medicine

## 2019-08-17 DIAGNOSIS — Z23 Encounter for immunization: Secondary | ICD-10-CM

## 2019-08-17 NOTE — Progress Notes (Signed)
   Covid-19 Vaccination Clinic  Name:  [name] [name]    MRN: [id number] DOB: 1931/06/19  08/17/2019  [name] [name] was observed post Covid-19 immunization for 15 minutes without incident. He was provided with Vaccine Information Sheet and instruction to access the V-Safe system.   [name] [name] was instructed to call 911 with any severe reactions post vaccine: Marland Kitchen Difficulty breathing  . Swelling of face and throat  . A fast heartbeat  . A bad rash all over body  . Dizziness and weakness   Immunizations Administered    Name Date Dose VIS Date Route   Pfizer COVID-19 Vaccine 08/17/2019  1:21 PM 0.3 mL 05/25/2019 Intramuscular   Manufacturer: [employer], [employer]   Lot: [id number]   NDC: [address]-9

## 2019-10-12 ENCOUNTER — Other Ambulatory Visit: Payer: Self-pay | Admitting: Physician Assistant

## 2019-10-12 NOTE — Telephone Encounter (Signed)
This is Dr. Jordan's pt. °

## 2019-10-15 NOTE — Telephone Encounter (Signed)
Rx(s) sent to pharmacy electronically.  

## 2019-10-26 ENCOUNTER — Telehealth: Payer: Self-pay | Admitting: Cardiology

## 2019-10-26 NOTE — Telephone Encounter (Signed)
New message  STAT if patient feels like he/she is going to faint   1) Are you dizzy now? Yes  2) Do you feel faint or have you passed out? No  3) Do you have any other symptoms? Lightheaded, dizzy, per patient blood pressure elevated  4) Have you checked your HR and BP (record if available)? 154/80 HR 74

## 2019-10-26 NOTE — Telephone Encounter (Signed)
I spoke with patient. He reports feeling dizzy and lightheaded for the last week.  Is constant. Not related to changes in position. States he has slight headache/head pressure. No nasal drainage. Occasional productive cough but this is not new. No chest pain. No shortness of breath. Has checked BP twice since dizziness started. Today BP was 154/80 and heart rate 73.  He does not have another reading but states systolic was in 150's.  He takes amlodipine at night. Will forward to Dr Swaziland for review/recomendations.

## 2019-10-26 NOTE — Telephone Encounter (Signed)
Not sure what is causing dizziness. Can offer visit with APP  Myrtle Barnhard [place] MD, [facility]

## 2019-11-09 NOTE — Telephone Encounter (Signed)
Spoke to patient Dr.Jordan's advice given.Stated dizziness is alittle better.He will keep appointment already scheduled with Dr.Jordan 7/13 at 3:40 pm.Advised to call sooner if needed.

## 2019-12-21 NOTE — Progress Notes (Signed)
Date:  12/25/2019   ID:  [name] [name], DOB Nov 01, 1931, MRN [id number]   PCP:  Jarome Matin, MD  Cardiologist:  Dazaria Macneill [place] MD Electrophysiologist:  None   Evaluation Performed:  Follow-Up Visit  Chief Complaint:  Follow up s/p TAVR  History of Present Illness:    [name] [name] is a 86 y.o. male with with a history of IDDM, GERD with hiatal hernia, cholelithiasis, diverticulosis, ventral incisional hernia, hypertension,OSA on CPAP,peripheral arterial disease, remote history of previous aortobiiliac grafting for abdominal aortic aneurysm and severe AS s/p TAVR (07/04/18) who presents to clinic for follow up.   He was seen initially last year for pre op evaluation for possible cholecystectomy.  He was found to have severe aortic stenosis.  L/RHC showed mild non obstructive CAD except for a 80% stenosis of the RV marginal branch. Medical therapy was recommended for his CAD.   He underwent successful TAVR 07/04/18 with a 26 mm Edwards Sapien 3 THV.  Post op ecg showed a new LBBB. His hospital course was complicated by labile blood pressure, AKI with creatinine as high as 2.6 and acute blood loss anemia 2/2 groin hematoma. Losartan was held and he was transfused. Post op echo showed LVEF 55-60% with normally functioning TAVR valve. He was discharged on aspirin and plavix. Further adjustments made in BP and statin medications as outpatient. Follow up Echo later showed normally functioning AV prosthesis with EF 45-50%.   On follow up today he is doing well.  Has a localized discomfort over his left lower rib. This hasn't changed. He has frequent urination and was prescribed finasteride. No dyspnea or chest pain.   Past Medical History:  Diagnosis Date  . Arthritis of back   . BPH (benign prostatic hyperplasia)   . CKD (chronic kidney disease) stage 3, GFR 30-59 ml/min   . Colon polyps   . Diabetes mellitus, type 2 (HCC)   . Diabetic nephropathy (HCC)   . Diverticulosis   . ED (erectile  dysfunction)   . Gallstones   . GERD (gastroesophageal reflux disease)   . Glaucoma   . Hiatal hernia   . History of gout   . History of hiatal hernia   . Hx of urinary tract infection   . Hypercholesterolemia   . Hypertension   . Left groin hematoma 07/04/2018  . Osteoarthritis    left knee  . Pneumonia   . PVD (peripheral vascular disease) (HCC)   . 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 Severe aortic stenosis   . Sleep apnea   . Sleep apnea, obstructive    Past Surgical History:  Procedure Laterality Date  . ABDOMINAL AORTIC ANEURYSM REPAIR  1995  . APPENDECTOMY    . CATARACT EXTRACTION  2003  . FEMORAL-POPLITEAL BYPASS GRAFT     left  . INTRAOPERATIVE TRANSTHORACIC ECHOCARDIOGRAM  07/04/2018   Procedure: INTRAOPERATIVE TRANSTHORACIC ECHOCARDIOGRAM;  Surgeon: Tonny Bollman, MD;  Location: [facility] OR;  Service: Open Heart Surgery;;  . LUMBAR SPINE SURGERY  03/2001  . REPLACEMENT TOTAL KNEE Bilateral 2004  . RIGHT/LEFT HEART CATH AND CORONARY ANGIOGRAPHY N/A 06/13/2018   Procedure: RIGHT/LEFT HEART CATH AND CORONARY ANGIOGRAPHY;  Surgeon: [place], Lizmarie Witters M, MD;  Location: [facility] INVASIVE CV LAB;  Service: Cardiovascular;  Laterality: N/A;  . TEE WITHOUT CARDIOVERSION  07/04/2018   Procedure: TRANSESOPHAGEAL ECHOCARDIOGRAM (TEE);  Surgeon: Tonny Bollman, MD;  Location: [facility]  OR;  Service: Open Heart Surgery;;  . TOTAL KNEE ARTHROPLASTY     bilateral  . TRANSCATHETER AORTIC VALVE REPLACEMENT, TRANSFEMORAL  07/04/2018  . TRANSCATHETER AORTIC VALVE REPLACEMENT, TRANSFEMORAL N/A 07/04/2018   Procedure: TRANSCATHETER AORTIC VALVE REPLACEMENT, TRANSFEMORAL;  Surgeon: Tonny Bollmanooper, Michael, MD;  [facility];  Service: Open Heart Surgery;  Laterality: N/A;  . VASECTOMY       Current Meds  Medication Sig  . acetaminophen (TYLENOL) 325 MG tablet Take 2 tablets (650 mg  total) by mouth every 6 (six) hours as needed for mild pain.  Marland Kitchen. allopurinol (ZYLOPRIM) 300 MG tablet Take 300 mg by mouth daily.   Marland Kitchen. amLODipine (NORVASC) 10 MG tablet TAKE 1 TABLET BY MOUTH DAILY.  Marland Kitchen. amoxicillin (AMOXIL) 500 MG tablet Take 4 tablets (2,000 mg) one hour prior to all dental appointments.  Marland Kitchen. aspirin 81 MG tablet Take 81 mg by mouth daily.  Marland Kitchen. atorvastatin (LIPITOR) 80 MG tablet TAKE 1 TABLET BY MOUTH DAILY.  . carboxymethylcellulose (REFRESH PLUS) 0.5 % SOLN Place 1 drop into both eyes daily as needed (dry eyes).  . dorzolamide-timolol (COSOPT) 22.3-6.8 MG/ML ophthalmic solution Place 1 drop into the left eye 2 (two) times daily.  . hydrocortisone cream 1 % Apply 1 application topically daily as needed for itching.  . insulin detemir (LEVEMIR) 100 UNIT/ML injection Inject 34 Units into the skin every morning.   . Multiple Vitamin (MULTIVITAMIN WITH MINERALS) TABS tablet Take 2 tablets by mouth daily.  . nitroGLYCERIN (NITROSTAT) 0.4 MG SL tablet PLACE 1 TABLET UNDER THE TONGUE AS NEEDED FOR CHEST PAIN     Allergies:   Indocin [indomethacin]   Social History   Tobacco Use  . Smoking status: Former Smoker    Packs/day: 2.00    Years: 30.00    Pack years: 60.00    Types: Cigarettes    Quit date: 11/04/1984    Years since quitting: 35.1  . Smokeless tobacco: Never Used  Vaping Use  . Vaping Use: Never used  Substance Use Topics  . Alcohol use: Yes    Alcohol/week: 0.0 standard drinks    Comment: rarely  . Drug use: No     Family Hx: The patient's family history includes Alzheimer's disease in his father; Heart attack in his mother; Pancreatic cancer in his sister.  ROS:   Please see the history of present illness.    All other systems reviewed and are negative.   Prior CV studies:   The following studies were reviewed today:   RIGHT/LEFT HEART CATH AND CORONARY ANGIOGRAPHY  Conclusion     Prox LAD lesion is 25% stenosed.  Ost RCA lesion is 40%  stenosed.  Acute Mrg lesion is 80% stenosed.  LV end diastolic pressure is normal.  There is severe aortic valve stenosis.   1. Nonobstructive CAD except for 80% RV marginal branch 2. Severe calcific aortic stenosis. Mean gradient 45 mm Hg, peak to peak 66 mm Hg, AV area 0.9 cm squared with index 0.4 3. Normal right heart and LV filling pressures 4. Normal cardiac output.  Plan: will refer to our heart valve team for consideration of AVR.      Echo 07/20/18 IMPRESSIONS   1. The left ventricle has low normal systolic function, with an ejection fraction of 50-55%. The cavity size was normal. Left ventricular diastology could not be evaluated due to nondiagnostic images. 2. The right ventricle has normal systolic function. The cavity was normal. There is no increase in right ventricular  wall thickness. 3. The mitral valve is normal in structure. 4. The tricuspid valve is normal in structure. 5. A 65mm Edwards Sapien bioprosthetic aortic valve (TAVR) valve is present in the aortic position. Procedure Date: 07/04/18. Trivial posterior paravalvular regurgitation, no evidence of prosthetic regurgitation. 6. AV Mean systolic Grad: 11.0 mmHg.  Echo 06/01/19: IMPRESSIONS    1. Left ventricular ejection fraction, by visual estimation, is 45 to 50%. The left ventricle has mildly decreased function. There is mildly increased left ventricular hypertrophy.  2. The left ventricle demonstrates regional wall motion abnormalities.  3. Global right ventricle has normal systolic function.The right ventricular size is normal.  4. Left atrial size was normal.  5. Right atrial size was normal.  6. The mitral valve is normal in structure. Trivial mitral valve regurgitation. No evidence of mitral stenosis.  7. The tricuspid valve is normal in structure. Tricuspid valve regurgitation is not demonstrated.  8. Aortic valve regurgitation is mild. No evidence of aortic valve sclerosis or stenosis.   9. The pulmonic valve was not well visualized. Pulmonic valve regurgitation is not visualized. 10. The inferior vena cava is normal in size with greater than 50% respiratory variability, suggesting right atrial pressure of 3 mmHg. 11. Definity used; septal hypokinesis with overall mild LV dysfunction; mild LVH; s/p TAVR with mean gradient 8 mmHg, AVA 1.3 cm2 and mild AI.  Labs/Other Tests and Data Reviewed:    EKG:  No ECG reviewed.  Recent Labs: No results found for requested labs within last 8760 hours.   Recent Lipid Panel No results found for: CHOL, TRIG, HDL, CHOLHDL, LDLCALC, LDLDIRECT  Labs dated 04/17/18: cholesterol 182, triglycerides 169, HDL 36, LDL 112.  Dated 07/17/19: cholesterol 124, triglycerides 113, HDL 34, LDL 67. Creatinine 1.7. otherwise CMET normal.  Dated 12/21/19: A1c 5.9%.    Wt Readings from Last 3 Encounters:  12/25/19 230 lb (104.3 kg)  06/04/19 228 lb (103.4 kg)  11/13/18 260 lb (117.9 kg)     Objective:    Vital Signs:  BP 130/64   Pulse 85   Temp (!) 97.2 F (36.2 C)   Ht 5\' 11"  (1.803 m)   Wt 230 lb (104.3 kg)   SpO2 98%   BMI 32.08 kg/m    GENERAL:  Well appearing obese WM in NAD HEENT:  PERRL, EOMI, sclera are clear. Oropharynx is clear. NECK:  No jugular venous distention, carotid upstroke brisk and symmetric, no bruits, no thyromegaly or adenopathy LUNGS:  Clear to auscultation bilaterally CHEST:  Unremarkable HEART:  RRR,  PMI not displaced or sustained,S1 and S2 within normal limits, no S3, no S4: no clicks, no rubs, soft 1-2/6 systolic murmur ABD:  Soft, nontender. BS +, no masses or bruits. No hepatomegaly, no splenomegaly EXT:  2 + pulses throughout, no edema, no cyanosis no clubbing SKIN:  Warm and dry.  No rashes NEURO:  Alert and oriented x 3. Cranial nerves II through XII intact. PSYCH:  Cognitively intact    ASSESSMENT & PLAN:    1. Severe AS s/p TAVR January 2020: follow up Echo showed good valve function. SBE prophylaxis  recommended. Doing well.   2. LV dysfunction. NYHA class 2. Difficult to assess since he is so sedentary. EF is mildly reduced 45-50% compared to prior likely due to LBBB. Not a candidate for ACEi/ARB/aldactone due to CKD.   3. CKD stage IV: creat baseline 1.5-1.84. Losartan has been discontinued. Last creatinine 1.7.   4. Abdominal pain: seen by Dr February 2020. Conservative  therapy recommended. Did not recommend surgery for his GB or ventral hernia. Abdominal pain resolved.   5. CAD. Nonobstructive except for RV marginal branch. On statin and ASA.    Medication Adjustments/Labs and Tests Ordered: Current medicines are reviewed at length with the patient today.  Concerns regarding medicines are outlined above.   Tests Ordered: No orders of the defined types were placed in this encounter.   Medication Changes: No orders of the defined types were placed in this encounter.   Disposition:  Follow up in 6 month(s)  Signed, Janeal Abadi [place], MD  12/25/2019 4:06 PM    [facility]

## 2019-12-25 ENCOUNTER — Other Ambulatory Visit: Payer: Self-pay

## 2019-12-25 ENCOUNTER — Encounter: Payer: Self-pay | Admitting: Cardiology

## 2019-12-25 ENCOUNTER — Ambulatory Visit: Payer: Medicare Other | Admitting: Cardiology

## 2019-12-25 VITALS — BP 130/64 | HR 85 | Temp 97.2°F | Ht 71.0 in | Wt 230.0 lb

## 2019-12-25 DIAGNOSIS — Z952 Presence of prosthetic heart valve: Secondary | ICD-10-CM

## 2019-12-25 DIAGNOSIS — I25118 Atherosclerotic heart disease of native coronary artery with other forms of angina pectoris: Secondary | ICD-10-CM

## 2019-12-25 DIAGNOSIS — N189 Chronic kidney disease, unspecified: Secondary | ICD-10-CM

## 2019-12-25 DIAGNOSIS — I350 Nonrheumatic aortic (valve) stenosis: Secondary | ICD-10-CM

## 2020-01-28 ENCOUNTER — Encounter (INDEPENDENT_AMBULATORY_CARE_PROVIDER_SITE_OTHER): Payer: Self-pay

## 2020-01-30 ENCOUNTER — Other Ambulatory Visit: Payer: Self-pay

## 2020-01-30 ENCOUNTER — Encounter (INDEPENDENT_AMBULATORY_CARE_PROVIDER_SITE_OTHER): Payer: Self-pay | Admitting: Ophthalmology

## 2020-01-30 ENCOUNTER — Ambulatory Visit (INDEPENDENT_AMBULATORY_CARE_PROVIDER_SITE_OTHER): Payer: Medicare Other | Admitting: Ophthalmology

## 2020-01-30 DIAGNOSIS — H34812 Central retinal vein occlusion, left eye, with macular edema: Secondary | ICD-10-CM

## 2020-01-30 MED ORDER — BEVACIZUMAB CHEMO INJECTION 1.25MG/0.05ML SYRINGE FOR KALEIDOSCOPE
1.2500 mg | INTRAVITREAL | Status: AC | PRN
  Administered 2020-01-30: 1.25 mg via INTRAVITREAL

## 2020-01-30 NOTE — Progress Notes (Signed)
01/30/2020     CHIEF COMPLAINT Patient presents for Blurred Vision   HISTORY OF PRESENT ILLNESS: [name] [name] is a 86 y.o. male who presents to the clinic today for:   HPI    Blurred Vision    In left eye.  Onset was sudden.  Vision is blurred.  Severity is severe.  This started 1 week ago.  Occurring constantly.  It is worse throughout the day.  Context:  distance vision, mid-range vision and near vision.  Since onset it is stable.  Treatments tried include no treatments.  Response to treatment was no improvement.  I, the attending physician,  performed the HPI with the patient and updated documentation appropriately.          Comments    Pt referred by Dr. Krista Blue for CRVO OS. OCT FP  Pt states about 1 week ago OS vision became very blurry. Pt was seen by Dr. Krista Blue on Monday. Pt is currently taking Dorzolamide/Timolol 1 gtt BID OS       Last edited by Elyse Jarvis on 01/30/2020  2:04 PM. (History)      Referring physician: Jarome Matin, MD [address] [place],  [place] [address]  HISTORICAL INFORMATION:   Selected notes from the MEDICAL RECORD NUMBER    Lab Results  Component Value Date   HGBA1C 6.0 (H) 07/03/2018     CURRENT MEDICATIONS: Current Outpatient Medications (Ophthalmic Drugs)  Medication Sig  . carboxymethylcellulose (REFRESH PLUS) 0.5 % SOLN Place 1 drop into both eyes daily as needed (dry eyes).  . dorzolamide-timolol (COSOPT) 22.3-6.8 MG/ML ophthalmic solution Place 1 drop into the left eye 2 (two) times daily.   No current facility-administered medications for this visit. (Ophthalmic Drugs)   Current Outpatient Medications (Other)  Medication Sig  . acetaminophen (TYLENOL) 325 MG tablet Take 2 tablets (650 mg total) by mouth every 6 (six) hours as needed for mild pain.  Marland Kitchen allopurinol (ZYLOPRIM) 300 MG tablet Take 300 mg by mouth daily.   Marland Kitchen amLODipine (NORVASC) 10 MG tablet TAKE 1 TABLET BY MOUTH DAILY.  Marland Kitchen amoxicillin (AMOXIL) 500 MG  tablet Take 4 tablets (2,000 mg) one hour prior to all dental appointments.  Marland Kitchen aspirin 81 MG tablet Take 81 mg by mouth daily.  Marland Kitchen atorvastatin (LIPITOR) 80 MG tablet TAKE 1 TABLET BY MOUTH DAILY.  . hydrocortisone cream 1 % Apply 1 application topically daily as needed for itching.  . insulin detemir (LEVEMIR) 100 UNIT/ML injection Inject 34 Units into the skin every morning.   . Multiple Vitamin (MULTIVITAMIN WITH MINERALS) TABS tablet Take 2 tablets by mouth daily.  . nitroGLYCERIN (NITROSTAT) 0.4 MG SL tablet PLACE 1 TABLET UNDER THE TONGUE AS NEEDED FOR CHEST PAIN   No current facility-administered medications for this visit. (Other)      REVIEW OF SYSTEMS:    ALLERGIES Allergies  Allergen Reactions  . Indocin [Indomethacin] Other (See Comments)    Antiinflammatory medication for gout ? Indocin > black out per patient ? SYNCOPE ?    PAST MEDICAL HISTORY Past Medical History:  Diagnosis Date  . Arthritis of back   . BPH (benign prostatic hyperplasia)   . CKD (chronic kidney disease) stage 3, GFR 30-59 ml/min   . Colon polyps   . Diabetes mellitus, type 2 (HCC)   . Diabetic nephropathy (HCC)   . Diverticulosis   . ED (erectile dysfunction)   . Gallstones   . GERD (gastroesophageal reflux disease)   . Glaucoma   .  Hiatal hernia   . History of gout   . History of hiatal hernia   . Hx of urinary tract infection   . Hypercholesterolemia   . Hypertension   . Left groin hematoma 07/04/2018  . Osteoarthritis    left knee  . Pneumonia   . PVD (peripheral vascular disease) (HCC)   . 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 Severe aortic stenosis   . Sleep apnea   . Sleep apnea, obstructive    Past Surgical History:  Procedure Laterality Date  . ABDOMINAL AORTIC ANEURYSM REPAIR  1995  . APPENDECTOMY    . CATARACT EXTRACTION Bilateral 2003  .  FEMORAL-POPLITEAL BYPASS GRAFT     left  . INTRAOPERATIVE TRANSTHORACIC ECHOCARDIOGRAM  07/04/2018   Procedure: INTRAOPERATIVE TRANSTHORACIC ECHOCARDIOGRAM;  Surgeon: Tonny Bollman, MD;  Location: [facility] OR;  Service: Open Heart Surgery;;  . LUMBAR SPINE SURGERY  03/2001  . REPLACEMENT TOTAL KNEE Bilateral 2004  . RIGHT/LEFT HEART CATH AND CORONARY ANGIOGRAPHY N/A 06/13/2018   Procedure: RIGHT/LEFT HEART CATH AND CORONARY ANGIOGRAPHY;  Surgeon: Swaziland, Peter M, MD;  Location: [facility] INVASIVE CV LAB;  Service: Cardiovascular;  Laterality: N/A;  . TEE WITHOUT CARDIOVERSION  07/04/2018   Procedure: TRANSESOPHAGEAL ECHOCARDIOGRAM (TEE);  Surgeon: Tonny Bollman, MD;  Location: [facility] OR;  Service: Open Heart Surgery;;  . TOTAL KNEE ARTHROPLASTY     bilateral  . TRANSCATHETER AORTIC VALVE REPLACEMENT, TRANSFEMORAL  07/04/2018  . TRANSCATHETER AORTIC VALVE REPLACEMENT, TRANSFEMORAL N/A 07/04/2018   Procedure: TRANSCATHETER AORTIC VALVE REPLACEMENT, TRANSFEMORAL;  Surgeon: Tonny Bollman, MD;  Location: [facility] OR;  Service: Open Heart Surgery;  Laterality: N/A;  . VASECTOMY      FAMILY HISTORY Family History  Problem Relation Age of Onset  . Heart attack Mother   . Alzheimer's disease Father   . Pancreatic cancer Sister     SOCIAL HISTORY Social History   Tobacco Use  . Smoking status: Former Smoker    Packs/day: 2.00    Years: 30.00    Pack years: 60.00    Types: Cigarettes    Quit date: 11/04/1984    Years since quitting: 35.2  . Smokeless tobacco: Never Used  Vaping Use  . Vaping Use: Never used  Substance Use Topics  . Alcohol use: Yes    Alcohol/week: 0.0 standard drinks    Comment: rarely  . Drug use: No         OPHTHALMIC EXAM:  Base Eye Exam    Visual Acuity (Snellen - Linear)      Right Left   Dist cc 20/20 E Card @ 4'   Dist ph cc  NI   Correction: Glasses       Tonometry (Tonopen, 2:09 PM)      Right Left   Pressure 13 11       Pupils      Pupils Dark Light Shape  React APD   Right PERRL 3 2 Round Brisk None   Left PERRL 3 2 Irregular Brisk None       Visual Fields (Counting fingers)      Left Right    Full Full       Neuro/Psych    Oriented x3: Yes   Mood/Affect: Normal       Dilation    Both eyes: 1.0% Mydriacyl, 2.5% Phenylephrine @ 2:09 PM        Slit  Lamp and Fundus Exam    External Exam      Right Left   External Normal Normal       Slit Lamp Exam      Right Left   Lids/Lashes Normal Normal   Conjunctiva/Sclera White and quiet White and quiet   Cornea Clear Clear   Anterior Chamber Deep and quiet Deep and quiet   Iris Round and reactive Round and reactive   Lens Centered posterior chamber intraocular lens Centered posterior chamber intraocular lens   Anterior Vitreous Normal Normal       Fundus Exam      Right Left   Posterior Vitreous Normal Normal   Disc Normal Hemorrhage   C/D Ratio 0.7 0.7   Macula Normal Macular thickening, Microaneurysms, Intraretinal hemorrhage   Vessels Normal CRVO,    Periphery Normal Diffuse intraretinal hemorrhages, engorged vein, characteristic of Ischemic central retinal vein occlusion however there are no significant number of cotton-wool spots          IMAGING AND PROCEDURES  Imaging and Procedures for 01/30/20  OCT, Retina - OU - Both Eyes       Right Eye Quality was good. Scan locations included subfoveal. Central Foveal Thickness: 279. Progression has no prior data.   Left Eye Quality was good. Scan locations included subfoveal. Central Foveal Thickness: 1028. Progression has no prior data. Findings include cystoid macular edema.   Notes OS with massive edema, some inner retinal whitening by OCT yet with CME, this could signify some partial occlusion of cilioretinal artery.  Mostly however this is a  central retinal vein occlusion vision loss and CME onset some 2 weeks previous by symptoms       Color Fundus Photography Optos - OU - Both Eyes       Right  Eye Progression has no prior data. Disc findings include increased cup to disc ratio. Macula : normal observations. Vessels : normal observations. Periphery : normal observations.   Left Eye Progression has no prior data. Disc findings include edema. Macula : microaneurysms, hemorrhage.   Notes OS, with ischemic central retinal vein occlusion and some opacification to the inner retinal layers which could suggest possible combined cilioretinal artery occlusion in this area although there is no clinically evident retinal whitening       Intravitreal Injection, Pharmacologic Agent - OS - Left Eye       Time Out 01/30/2020. 3:47 PM. Confirmed correct patient, procedure, site, and patient consented.   Anesthesia Topical anesthesia was used. Anesthetic medications included Akten 3.5%.   Procedure Preparation included Ofloxacin , Tobramycin 0.3%, 5% betadine to ocular surface. A supplied needle was used.   Injection:  1.25 mg Bevacizumab (AVASTIN) SOLN   NDC: [address]-369194-0334-1, Lot: [id number]   Route: Intravitreal, Site: Left Eye, Waste: 0 mg  Post-op Post injection exam found visual acuity of at least counting fingers. The patient tolerated the procedure well. There were no complications. The patient received written and verbal post procedure care education. Post injection medications were not given.                 ASSESSMENT/PLAN:  Central retinal vein occlusion, left eye, with macular edema Need to commence with intravitreal Avastin OS today      ICD-10-CM   1. Central retinal vein occlusion, left eye, with macular edema  H34.8120 OCT, Retina - OU - Both Eyes    Color Fundus Photography Optos - OU - Both Eyes    Intravitreal Injection, Pharmacologic  Agent - OS - Left Eye    Bevacizumab (AVASTIN) SOLN 1.25 mg    1.  Commence with intravitreal Avastin OS today to control and treat and try to improve CME from CRV O OS  2.  3.  Ophthalmic Meds Ordered this visit:  Meds  ordered this encounter  Medications  . Bevacizumab (AVASTIN) SOLN 1.25 mg       Return in about 5 weeks (around 03/05/2020) for dilate, OS, AVASTIN OCT.  There are no Patient Instructions on file for this visit.   Explained the diagnoses, plan, and follow up with the patient and they expressed understanding.  Patient expressed understanding of the importance of proper follow up care.   Alford Highland Sarita Hakanson M.D. Diseases & Surgery of the Retina and Vitreous [facility] 01/30/20     Abbreviations: M myopia (nearsighted); A astigmatism; H hyperopia (farsighted); P presbyopia; Mrx spectacle prescription;  CTL contact lenses; OD right eye; OS left eye; OU both eyes  XT exotropia; ET esotropia; PEK punctate epithelial keratitis; PEE punctate epithelial erosions; DES dry eye syndrome; MGD meibomian gland dysfunction; ATs artificial tears; PFAT's preservative free artificial tears; NSC nuclear sclerotic cataract; PSC posterior subcapsular cataract; ERM epi-retinal membrane; PVD posterior vitreous detachment; RD retinal detachment; DM diabetes mellitus; DR diabetic retinopathy; NPDR non-proliferative diabetic retinopathy; PDR proliferative diabetic retinopathy; CSME clinically significant macular edema; DME diabetic macular edema; dbh dot blot hemorrhages; CWS cotton wool spot; POAG primary open angle glaucoma; C/D cup-to-disc ratio; HVF humphrey visual field; GVF goldmann visual field; OCT optical coherence tomography; IOP intraocular pressure; BRVO Branch retinal vein occlusion; CRVO central retinal vein occlusion; CRAO central retinal artery occlusion; BRAO branch retinal artery occlusion; RT retinal tear; SB scleral buckle; PPV pars plana vitrectomy; VH Vitreous hemorrhage; PRP panretinal laser photocoagulation; IVK intravitreal kenalog; VMT vitreomacular traction; MH Macular hole;  NVD neovascularization of the disc; NVE neovascularization elsewhere; AREDS age related eye disease study;  ARMD age related macular degeneration; POAG primary open angle glaucoma; EBMD epithelial/anterior basement membrane dystrophy; ACIOL anterior chamber intraocular lens; IOL intraocular lens; PCIOL posterior chamber intraocular lens; Phaco/IOL phacoemulsification with intraocular lens placement; PRK photorefractive keratectomy; LASIK laser assisted in situ keratomileusis; HTN hypertension; DM diabetes mellitus; COPD chronic obstructive pulmonary disease

## 2020-01-30 NOTE — Assessment & Plan Note (Signed)
Need to commence with intravitreal Avastin OS today

## 2020-02-11 ENCOUNTER — Encounter (INDEPENDENT_AMBULATORY_CARE_PROVIDER_SITE_OTHER): Payer: Self-pay | Admitting: Ophthalmology

## 2020-03-06 ENCOUNTER — Encounter (INDEPENDENT_AMBULATORY_CARE_PROVIDER_SITE_OTHER): Payer: Medicare Other | Admitting: Ophthalmology

## 2020-03-13 ENCOUNTER — Encounter (INDEPENDENT_AMBULATORY_CARE_PROVIDER_SITE_OTHER): Payer: Medicare Other | Admitting: Ophthalmology

## 2020-03-13 ENCOUNTER — Encounter (INDEPENDENT_AMBULATORY_CARE_PROVIDER_SITE_OTHER): Payer: Self-pay

## 2020-04-03 ENCOUNTER — Ambulatory Visit (INDEPENDENT_AMBULATORY_CARE_PROVIDER_SITE_OTHER): Payer: Medicare Other | Admitting: Ophthalmology

## 2020-04-03 ENCOUNTER — Other Ambulatory Visit: Payer: Self-pay

## 2020-04-03 ENCOUNTER — Encounter (INDEPENDENT_AMBULATORY_CARE_PROVIDER_SITE_OTHER): Payer: Self-pay | Admitting: Ophthalmology

## 2020-04-03 DIAGNOSIS — H34812 Central retinal vein occlusion, left eye, with macular edema: Secondary | ICD-10-CM

## 2020-04-03 MED ORDER — BEVACIZUMAB CHEMO INJECTION 1.25MG/0.05ML SYRINGE FOR KALEIDOSCOPE
1.2500 mg | INTRAVITREAL | Status: AC | PRN
  Administered 2020-04-03: 1.25 mg via INTRAVITREAL

## 2020-04-03 NOTE — Assessment & Plan Note (Signed)
Vastly improved macular findings left eye status post Avastin No. 1 we will repeat today and extend interval follow-up to 6 weeks

## 2020-04-03 NOTE — Progress Notes (Signed)
04/03/2020     CHIEF COMPLAINT Patient presents for Retina Follow Up   HISTORY OF PRESENT ILLNESS: [name] [name] is a 86 y.o. male who presents to the clinic today for:   HPI    Retina Follow Up    Patient presents with  CRVO/BRVO.  In left eye.  Severity is moderate.  Duration of 9 weeks.  Since onset it is stable.  I, the attending physician,  performed the HPI with the patient and updated documentation appropriately.          Comments    9 Week CRVO f\u OS. Possible Avastin OCT  Pt states OD vision is good and OS vision is blurry. Pt states he no longer sees the dark spot in OS.       Last edited by Elyse Jarvis on 04/03/2020  3:20 PM. (History)      Referring physician: Jarome Matin, MD [address] [place],  [place] [address]  HISTORICAL INFORMATION:   Selected notes from the MEDICAL RECORD NUMBER    Lab Results  Component Value Date   HGBA1C 6.0 (H) 07/03/2018     CURRENT MEDICATIONS: Current Outpatient Medications (Ophthalmic Drugs)  Medication Sig  . carboxymethylcellulose (REFRESH PLUS) 0.5 % SOLN Place 1 drop into both eyes daily as needed (dry eyes).  . dorzolamide-timolol (COSOPT) 22.3-6.8 MG/ML ophthalmic solution Place 1 drop into the left eye 2 (two) times daily.   No current facility-administered medications for this visit. (Ophthalmic Drugs)   Current Outpatient Medications (Other)  Medication Sig  . acetaminophen (TYLENOL) 325 MG tablet Take 2 tablets (650 mg total) by mouth every 6 (six) hours as needed for mild pain.  Marland Kitchen allopurinol (ZYLOPRIM) 300 MG tablet Take 300 mg by mouth daily.   Marland Kitchen amLODipine (NORVASC) 10 MG tablet TAKE 1 TABLET BY MOUTH DAILY.  Marland Kitchen amoxicillin (AMOXIL) 500 MG tablet Take 4 tablets (2,000 mg) one hour prior to all dental appointments.  Marland Kitchen aspirin 81 MG tablet Take 81 mg by mouth daily.  Marland Kitchen atorvastatin (LIPITOR) 80 MG tablet TAKE 1 TABLET BY MOUTH DAILY.  . hydrocortisone cream 1 % Apply 1 application topically  daily as needed for itching.  . insulin detemir (LEVEMIR) 100 UNIT/ML injection Inject 34 Units into the skin every morning.   . Multiple Vitamin (MULTIVITAMIN WITH MINERALS) TABS tablet Take 2 tablets by mouth daily.  . nitroGLYCERIN (NITROSTAT) 0.4 MG SL tablet PLACE 1 TABLET UNDER THE TONGUE AS NEEDED FOR CHEST PAIN   No current facility-administered medications for this visit. (Other)      REVIEW OF SYSTEMS:    ALLERGIES Allergies  Allergen Reactions  . Indocin [Indomethacin] Other (See Comments)    Antiinflammatory medication for gout ? Indocin > black out per patient ? SYNCOPE ?    PAST MEDICAL HISTORY Past Medical History:  Diagnosis Date  . Arthritis of back   . BPH (benign prostatic hyperplasia)   . CKD (chronic kidney disease) stage 3, GFR 30-59 ml/min (HCC)   . Colon polyps   . Diabetes mellitus, type 2 (HCC)   . Diabetic nephropathy (HCC)   . Diverticulosis   . ED (erectile dysfunction)   . Gallstones   . GERD (gastroesophageal reflux disease)   . Glaucoma   . Hiatal hernia   . History of gout   . History of hiatal hernia   . Hx of urinary tract infection   . Hypercholesterolemia   . Hypertension   . Left groin hematoma 07/04/2018  .  Osteoarthritis    left knee  . Pneumonia   . PVD (peripheral vascular disease) (HCC)   . 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 Severe aortic stenosis   . Sleep apnea   . Sleep apnea, obstructive    Past Surgical History:  Procedure Laterality Date  . ABDOMINAL AORTIC ANEURYSM REPAIR  1995  . APPENDECTOMY    . CATARACT EXTRACTION Bilateral 2003  . FEMORAL-POPLITEAL BYPASS GRAFT     left  . INTRAOPERATIVE TRANSTHORACIC ECHOCARDIOGRAM  07/04/2018   Procedure: INTRAOPERATIVE TRANSTHORACIC ECHOCARDIOGRAM;  Surgeon: Tonny Bollman, MD;  Location: [facility] OR;  Service: Open Heart Surgery;;  . LUMBAR  SPINE SURGERY  03/2001  . REPLACEMENT TOTAL KNEE Bilateral 2004  . RIGHT/LEFT HEART CATH AND CORONARY ANGIOGRAPHY N/A 06/13/2018   Procedure: RIGHT/LEFT HEART CATH AND CORONARY ANGIOGRAPHY;  Surgeon: Swaziland, Peter M, MD;  Location: [facility] INVASIVE CV LAB;  Service: Cardiovascular;  Laterality: N/A;  . TEE WITHOUT CARDIOVERSION  07/04/2018   Procedure: TRANSESOPHAGEAL ECHOCARDIOGRAM (TEE);  Surgeon: Tonny Bollman, MD;  Location: [facility] OR;  Service: Open Heart Surgery;;  . TOTAL KNEE ARTHROPLASTY     bilateral  . TRANSCATHETER AORTIC VALVE REPLACEMENT, TRANSFEMORAL  07/04/2018  . TRANSCATHETER AORTIC VALVE REPLACEMENT, TRANSFEMORAL N/A 07/04/2018   Procedure: TRANSCATHETER AORTIC VALVE REPLACEMENT, TRANSFEMORAL;  Surgeon: Tonny Bollman, MD;  Location: [facility] OR;  Service: Open Heart Surgery;  Laterality: N/A;  . VASECTOMY      FAMILY HISTORY Family History  Problem Relation Age of Onset  . Heart attack Mother   . Alzheimer's disease Father   . Pancreatic cancer Sister     SOCIAL HISTORY Social History   Tobacco Use  . Smoking status: Former Smoker    Packs/day: 2.00    Years: 30.00    Pack years: 60.00    Types: Cigarettes    Quit date: 11/04/1984    Years since quitting: 35.4  . Smokeless tobacco: Never Used  Vaping Use  . Vaping Use: Never used  Substance Use Topics  . Alcohol use: Yes    Alcohol/week: 0.0 standard drinks    Comment: rarely  . Drug use: No         OPHTHALMIC EXAM: Base Eye Exam    Visual Acuity (Snellen - Linear)      Right Left   Dist cc 20/20 20/50 -2   Dist ph cc  20/40 -2   Correction: Glasses       Tonometry (Tonopen, 3:26 PM)      Right Left   Pressure 10 13       Pupils      Pupils Dark Light Shape React APD   Right PERRL 3 2 Round Brisk None   Left PERRL 3 3 Round Minimal None       Visual Fields (Counting fingers)      Left Right    Full Full       Neuro/Psych    Oriented x3: Yes   Mood/Affect: Normal       Dilation    Left  eye: 1.0% Mydriacyl, 2.5% Phenylephrine @ 3:26 PM        Slit Lamp and Fundus Exam    External Exam      Right Left   External Normal Normal       Slit Lamp Exam      Right Left   Lids/Lashes Normal Normal  Conjunctiva/Sclera White and quiet White and quiet   Cornea Clear Clear   Anterior Chamber Deep and quiet Deep and quiet   Iris Round and reactive Round and reactive   Lens Centered posterior chamber intraocular lens Centered posterior chamber intraocular lens   Anterior Vitreous Normal Normal       Fundus Exam      Right Left   Posterior Vitreous  Normal   Disc  Hemorrhage   C/D Ratio  0.7   Macula  Macular thickening, Microaneurysms, Intraretinal hemorrhage   Vessels  CRVO,    Periphery  Diffuse intraretinal hemorrhages, engorged vein, characteristic of Ischemic central retinal vein occlusion however there are no significant number of cotton-wool spots          IMAGING AND PROCEDURES  Imaging and Procedures for 04/03/20  OCT, Retina - OU - Both Eyes       Right Eye Quality was good. Scan locations included subfoveal. Central Foveal Thickness: 276. Progression has been stable. Findings include normal foveal contour.   Left Eye Quality was good. Scan locations included subfoveal. Central Foveal Thickness: 325. Progression has improved. Findings include abnormal foveal contour.   Notes Vastly improved subretinal fluid and thickening status post Avastin No. 1.  We will repeat injection today OS       Intravitreal Injection, Pharmacologic Agent - OS - Left Eye       Time Out 04/03/2020. 4:18 PM. Confirmed correct patient, procedure, site, and patient consented.   Anesthesia Topical anesthesia was used. Anesthetic medications included Akten 3.5%.   Procedure Preparation included Ofloxacin , Tobramycin 0.3%, 5% betadine to ocular surface. A supplied needle was used.   Injection:  1.25 mg Bevacizumab (AVASTIN) SOLN   NDC: 70360-001-02, Lot: [id number]    Route: Intravitreal, Site: Left Eye, Waste: 0 mg  Post-op Post injection exam found visual acuity of at least counting fingers. The patient tolerated the procedure well. There were no complications. The patient received written and verbal post procedure care education. Post injection medications were not given.                 ASSESSMENT/PLAN:  No problem-specific Assessment & Plan notes found for this encounter.      ICD-10-CM   1. Central retinal vein occlusion, left eye, with macular edema  H34.8120 OCT, Retina - OU - Both Eyes    Intravitreal Injection, Pharmacologic Agent - OS - Left Eye    Bevacizumab (AVASTIN) SOLN 1.25 mg    1.  Vastly improved macular edema left eye and acuity left eye post Avastin injection #1 we will repeat today  2.  3.  Ophthalmic Meds Ordered this visit:  Meds ordered this encounter  Medications  . Bevacizumab (AVASTIN) SOLN 1.25 mg       Return in about 6 weeks (around 05/15/2020) for dilate, OS, AVASTIN OCT.  There are no Patient Instructions on file for this visit.   Explained the diagnoses, plan, and follow up with the patient and they expressed understanding.  Patient expressed understanding of the importance of proper follow up care.   Alford HighlandGary A. Ziah Turvey M.D. Diseases & Surgery of the Retina and Vitreous [facility] 04/03/20     Abbreviations: M myopia (nearsighted); A astigmatism; H hyperopia (farsighted); P presbyopia; Mrx spectacle prescription;  CTL contact lenses; OD right eye; OS left eye; OU both eyes  XT exotropia; ET esotropia; PEK punctate epithelial keratitis; PEE punctate epithelial erosions; DES dry eye syndrome; MGD meibomian gland  dysfunction; ATs artificial tears; PFAT's preservative free artificial tears; NSC nuclear sclerotic cataract; PSC posterior subcapsular cataract; ERM epi-retinal membrane; PVD posterior vitreous detachment; RD retinal detachment; DM diabetes mellitus; DR diabetic  retinopathy; NPDR non-proliferative diabetic retinopathy; PDR proliferative diabetic retinopathy; CSME clinically significant macular edema; DME diabetic macular edema; dbh dot blot hemorrhages; CWS cotton wool spot; POAG primary open angle glaucoma; C/D cup-to-disc ratio; HVF humphrey visual field; GVF goldmann visual field; OCT optical coherence tomography; IOP intraocular pressure; BRVO Branch retinal vein occlusion; CRVO central retinal vein occlusion; CRAO central retinal artery occlusion; BRAO branch retinal artery occlusion; RT retinal tear; SB scleral buckle; PPV pars plana vitrectomy; VH Vitreous hemorrhage; PRP panretinal laser photocoagulation; IVK intravitreal kenalog; VMT vitreomacular traction; MH Macular hole;  NVD neovascularization of the disc; NVE neovascularization elsewhere; AREDS age related eye disease study; ARMD age related macular degeneration; POAG primary open angle glaucoma; EBMD epithelial/anterior basement membrane dystrophy; ACIOL anterior chamber intraocular lens; IOL intraocular lens; PCIOL posterior chamber intraocular lens; Phaco/IOL phacoemulsification with intraocular lens placement; PRK photorefractive keratectomy; LASIK laser assisted in situ keratomileusis; HTN hypertension; DM diabetes mellitus; COPD chronic obstructive pulmonary disease

## 2020-05-15 ENCOUNTER — Other Ambulatory Visit: Payer: Self-pay

## 2020-05-15 ENCOUNTER — Encounter (INDEPENDENT_AMBULATORY_CARE_PROVIDER_SITE_OTHER): Payer: Self-pay | Admitting: Ophthalmology

## 2020-05-15 ENCOUNTER — Ambulatory Visit (INDEPENDENT_AMBULATORY_CARE_PROVIDER_SITE_OTHER): Payer: Medicare Other | Admitting: Ophthalmology

## 2020-05-15 DIAGNOSIS — H34812 Central retinal vein occlusion, left eye, with macular edema: Secondary | ICD-10-CM | POA: Diagnosis not present

## 2020-05-15 DIAGNOSIS — H4052X3 Glaucoma secondary to other eye disorders, left eye, severe stage: Secondary | ICD-10-CM

## 2020-05-15 MED ORDER — BEVACIZUMAB 2.5 MG/0.1ML IZ SOSY
2.5000 mg | PREFILLED_SYRINGE | INTRAVITREAL | Status: AC | PRN
  Administered 2020-05-15: 2.5 mg via INTRAVITREAL

## 2020-05-15 NOTE — Progress Notes (Addendum)
05/15/2020     CHIEF COMPLAINT Patient presents for Retina Follow Up   HISTORY OF PRESENT ILLNESS: [name] [name] is a 86 y.o. male who presents to the clinic today for:   HPI    Retina Follow Up    Patient presents with  CRVO/BRVO.  In left eye.  This started 6 weeks ago.  Severity is mild.  Duration of 6 weeks.          Comments    6 WK F/U OS, POSS AVASTIN OS   Pt reports slightly improved vision OS, no new f/f, no pain or pressure.     A1C: 5.7 taken 03/2020    Last BS: 132, taken today.        Last edited by Varney Bilesrollinger, Lauren D on 05/15/2020  2:44 PM. (History)      Referring physician: Jarome MatinPaterson, Daniel, MD [address] [place] [id number]  HISTORICAL INFORMATION:   Selected notes from the MEDICAL RECORD NUMBER    Lab Results  Component Value Date   HGBA1C 6.0 (H) 07/03/2018     CURRENT MEDICATIONS: Current Outpatient Medications (Ophthalmic Drugs)  Medication Sig  . dorzolamide-timolol (COSOPT) 22.3-6.8 MG/ML ophthalmic solution Place 1 drop into the left eye 2 (two) times daily.  . carboxymethylcellulose (REFRESH PLUS) 0.5 % SOLN Place 1 drop into both eyes daily as needed (dry eyes).   No current facility-administered medications for this visit. (Ophthalmic Drugs)   Current Outpatient Medications (Other)  Medication Sig  . acetaminophen (TYLENOL) 325 MG tablet Take 2 tablets (650 mg total) by mouth every 6 (six) hours as needed for mild pain.  Marland Kitchen. allopurinol (ZYLOPRIM) 300 MG tablet Take 300 mg by mouth daily.   Marland Kitchen. amLODipine (NORVASC) 10 MG tablet TAKE 1 TABLET BY MOUTH DAILY.  Marland Kitchen. amoxicillin (AMOXIL) 500 MG tablet Take 4 tablets (2,000 mg) one hour prior to all dental appointments.  Marland Kitchen. aspirin 81 MG tablet Take 81 mg by mouth daily.  Marland Kitchen. atorvastatin (LIPITOR) 80 MG tablet TAKE 1 TABLET BY MOUTH DAILY.  . hydrocortisone cream 1 % Apply 1 application topically daily as needed for itching.  . insulin detemir (LEVEMIR) 100 UNIT/ML injection  Inject 34 Units into the skin every morning.   . Multiple Vitamin (MULTIVITAMIN WITH MINERALS) TABS tablet Take 2 tablets by mouth daily.  . nitroGLYCERIN (NITROSTAT) 0.4 MG SL tablet PLACE 1 TABLET UNDER THE TONGUE AS NEEDED FOR CHEST PAIN   No current facility-administered medications for this visit. (Other)      REVIEW OF SYSTEMS:    ALLERGIES Allergies  Allergen Reactions  . Indocin [Indomethacin] Other (See Comments)    Antiinflammatory medication for gout ? Indocin > black out per patient ? SYNCOPE ?    PAST MEDICAL HISTORY Past Medical History:  Diagnosis Date  . Arthritis of back   . BPH (benign prostatic hyperplasia)   . CKD (chronic kidney disease) stage 3, GFR 30-59 ml/min (HCC)   . Colon polyps   . Diabetes mellitus, type 2 (HCC)   . Diabetic nephropathy (HCC)   . Diverticulosis   . ED (erectile dysfunction)   . Gallstones   . GERD (gastroesophageal reflux disease)   . Glaucoma   . Hiatal hernia   . History of gout   . History of hiatal hernia   . Hx of urinary tract infection   . Hypercholesterolemia   . Hypertension   . Left groin hematoma 07/04/2018  . Osteoarthritis    left knee  .  Pneumonia   . PVD (peripheral vascular disease) (HCC)   . 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 Severe aortic stenosis   . Sleep apnea   . Sleep apnea, obstructive    Past Surgical History:  Procedure Laterality Date  . ABDOMINAL AORTIC ANEURYSM REPAIR  1995  . APPENDECTOMY    . CATARACT EXTRACTION Bilateral 2003  . FEMORAL-POPLITEAL BYPASS GRAFT     left  . INTRAOPERATIVE TRANSTHORACIC ECHOCARDIOGRAM  07/04/2018   Procedure: INTRAOPERATIVE TRANSTHORACIC ECHOCARDIOGRAM;  Surgeon: Tonny Bollman, MD;  Location: [facility] OR;  Service: Open Heart Surgery;;  . LUMBAR SPINE SURGERY  03/2001  . REPLACEMENT TOTAL KNEE Bilateral 2004  . RIGHT/LEFT  HEART CATH AND CORONARY ANGIOGRAPHY N/A 06/13/2018   Procedure: RIGHT/LEFT HEART CATH AND CORONARY ANGIOGRAPHY;  Surgeon: Swaziland, Peter M, MD;  Location: [facility] INVASIVE CV LAB;  Service: Cardiovascular;  Laterality: N/A;  . TEE WITHOUT CARDIOVERSION  07/04/2018   Procedure: TRANSESOPHAGEAL ECHOCARDIOGRAM (TEE);  Surgeon: Tonny Bollman, MD;  Location: [facility] OR;  Service: Open Heart Surgery;;  . TOTAL KNEE ARTHROPLASTY     bilateral  . TRANSCATHETER AORTIC VALVE REPLACEMENT, TRANSFEMORAL  07/04/2018  . TRANSCATHETER AORTIC VALVE REPLACEMENT, TRANSFEMORAL N/A 07/04/2018   Procedure: TRANSCATHETER AORTIC VALVE REPLACEMENT, TRANSFEMORAL;  Surgeon: Tonny Bollman, MD;  Location: [facility] OR;  Service: Open Heart Surgery;  Laterality: N/A;  . VASECTOMY      FAMILY HISTORY Family History  Problem Relation Age of Onset  . Heart attack Mother   . Alzheimer's disease Father   . Pancreatic cancer Sister     SOCIAL HISTORY Social History   Tobacco Use  . Smoking status: Former Smoker    Packs/day: 2.00    Years: 30.00    Pack years: 60.00    Types: Cigarettes    Quit date: 11/04/1984    Years since quitting: 35.5  . Smokeless tobacco: Never Used  Vaping Use  . Vaping Use: Never used  Substance Use Topics  . Alcohol use: Yes    Alcohol/week: 0.0 standard drinks    Comment: rarely  . Drug use: No         OPHTHALMIC EXAM:  Base Eye Exam    Visual Acuity (ETDRS)      Right Left   Dist cc 20/20 -1 20/40 +2   Dist ph cc  NI   Correction: Glasses       Tonometry (Tonopen, 2:49 PM)      Right Left   Pressure 15 19       Pupils      Dark Light Shape React APD   Right 3 2 Round Brisk None   Left 3 3 Round Minimal None       Visual Fields (Counting fingers)      Left Right    Full Full       Extraocular Movement      Right Left    Full Full       Neuro/Psych    Oriented x3: Yes   Mood/Affect: Normal       Dilation    Left eye: 1.0% Mydriacyl, 2.5% Phenylephrine @ 2:49 PM          Slit Lamp and Fundus Exam    External Exam      Right Left   External Normal Normal       Slit Lamp Exam  Right Left   Lids/Lashes Normal Normal   Conjunctiva/Sclera White and quiet White and quiet   Cornea Clear Clear   Anterior Chamber Deep and quiet Deep and quiet   Iris Round and reactive Round and reactive   Lens Centered posterior chamber intraocular lens Centered posterior chamber intraocular lens   Anterior Vitreous Normal Normal       Fundus Exam      Right Left   Posterior Vitreous  Normal   Disc  Collaterals on the nerve   C/D Ratio  0.7   Macula  No Macular thickening, Microaneurysms, Intraretinal hemorrhage   Vessels  CRVO, less distended, not engorged   Periphery  No peripheral ischemic changes, no peripheral hemorrhages seen          IMAGING AND PROCEDURES  Imaging and Procedures for 05/15/20  OCT, Retina - OU - Both Eyes       Right Eye Quality was good. Scan locations included subfoveal. Central Foveal Thickness: 283. Progression has been stable.   Left Eye Quality was good. Scan locations included subfoveal. Central Foveal Thickness: 326. Progression has improved. Findings include abnormal foveal contour.   Notes Vastly improved retinal thickening left eye as compared to August 2021. CME from [facility] O nearly normalized currently on intravitreal Avastin currently at 6-week follow-up. We will repeat injection today and examination in 7 weeks       Intravitreal Injection, Pharmacologic Agent - OS - Left Eye       Time Out 05/15/2020. 3:45 PM. Confirmed correct patient, procedure, site, and patient consented.   Anesthesia Topical anesthesia was used. Anesthetic medications included Akten 3.5%.   Procedure Preparation included Ofloxacin , 5% betadine to ocular surface, 10% betadine to eyelids. A 30 gauge needle was used.   Injection:  2.5 mg Bevacizumab (AVASTIN) 2.5mg /0.73mL SOSY   NDC: 20254-270-62, Lot: [id number]   Route:  Intravitreal, Site: Left Eye  Post-op Post injection exam found visual acuity of at least counting fingers. The patient tolerated the procedure well. There were no complications. The patient received written and verbal post procedure care education. Post injection medications were not given.        Aspiration of Aqueous, Diagnostic - OS - Left Eye       Patient experienced transient dimming and darkening or graying out of vision left eye post injection intravitreal Avastin.  Because of the transient elevation intraocular pressure secondary glaucoma,  Additional topical ofloxacin was applied while the eye was still anesthetized  Lid speculum applied.  30-gauge needle was used to remove 0.1 cc ACE traumatically from the anterior chamber complication in visual acuity return promptly                ASSESSMENT/PLAN:  Central retinal vein occlusion, left eye, with macular edema Vastly improved anatomy and acuity on intravitreal Avastin status post injection X2, will repeat injection today and reevaluate in 7 weeks      ICD-10-CM   1. Central retinal vein occlusion, left eye, with macular edema  H34.8120 OCT, Retina - OU - Both Eyes    Intravitreal Injection, Pharmacologic Agent - OS - Left Eye    bevacizumab (AVASTIN) SOSY 2.5 mg  2. Secondary glaucoma due to combination mechanisms, left, severe stage  H40.52X3 Aspiration of Aqueous, Diagnostic - OS - Left Eye    1. Repeat injection intravitreal Avastin OS today and examination again in 7 weeks  2. Demonstrated to the patient the OCT findings and the vastly improved proved anatomy as  he also recognizes the improved acuity  I also disclose the patient the left eye will probably never see as well as the right eye because the right eye has not suffered the same condition in the past  3.  Transient elevation of intraocular pressure of the left eye post intravitreal Avastin prompted urgent removal of anterior chamber aqueous,  atraumatically under sterile conditions.  Ophthalmic Meds Ordered this visit:  Meds ordered this encounter  Medications  . bevacizumab (AVASTIN) SOSY 2.5 mg       Return in about 7 weeks (around 07/03/2020) for dilate, OS, AVASTIN OCT.  There are no Patient Instructions on file for this visit.   Explained the diagnoses, plan, and follow up with the patient and they expressed understanding.  Patient expressed understanding of the importance of proper follow up care.   Alford Highland Jeannett Dekoning M.D. Diseases & Surgery of the Retina and Vitreous [facility] 05/15/20     Abbreviations: M myopia (nearsighted); A astigmatism; H hyperopia (farsighted); P presbyopia; Mrx spectacle prescription;  CTL contact lenses; OD right eye; OS left eye; OU both eyes  XT exotropia; ET esotropia; PEK punctate epithelial keratitis; PEE punctate epithelial erosions; DES dry eye syndrome; MGD meibomian gland dysfunction; ATs artificial tears; PFAT's preservative free artificial tears; NSC nuclear sclerotic cataract; PSC posterior subcapsular cataract; ERM epi-retinal membrane; PVD posterior vitreous detachment; RD retinal detachment; DM diabetes mellitus; DR diabetic retinopathy; NPDR non-proliferative diabetic retinopathy; PDR proliferative diabetic retinopathy; CSME clinically significant macular edema; DME diabetic macular edema; dbh dot blot hemorrhages; CWS cotton wool spot; POAG primary open angle glaucoma; C/D cup-to-disc ratio; HVF humphrey visual field; GVF goldmann visual field; OCT optical coherence tomography; IOP intraocular pressure; BRVO Branch retinal vein occlusion; CRVO central retinal vein occlusion; CRAO central retinal artery occlusion; BRAO branch retinal artery occlusion; RT retinal tear; SB scleral buckle; PPV pars plana vitrectomy; VH Vitreous hemorrhage; PRP panretinal laser photocoagulation; IVK intravitreal kenalog; VMT vitreomacular traction; MH Macular hole;  NVD  neovascularization of the disc; NVE neovascularization elsewhere; AREDS age related eye disease study; ARMD age related macular degeneration; POAG primary open angle glaucoma; EBMD epithelial/anterior basement membrane dystrophy; ACIOL anterior chamber intraocular lens; IOL intraocular lens; PCIOL posterior chamber intraocular lens; Phaco/IOL phacoemulsification with intraocular lens placement; PRK photorefractive keratectomy; LASIK laser assisted in situ keratomileusis; HTN hypertension; DM diabetes mellitus; COPD chronic obstructive pulmonary disease

## 2020-05-15 NOTE — Assessment & Plan Note (Signed)
Vastly improved anatomy and acuity on intravitreal Avastin status post injection X2, will repeat injection today and reevaluate in 7 weeks

## 2020-07-03 ENCOUNTER — Other Ambulatory Visit: Payer: Self-pay

## 2020-07-03 ENCOUNTER — Ambulatory Visit (INDEPENDENT_AMBULATORY_CARE_PROVIDER_SITE_OTHER): Payer: Medicare Other | Admitting: Ophthalmology

## 2020-07-03 ENCOUNTER — Encounter (INDEPENDENT_AMBULATORY_CARE_PROVIDER_SITE_OTHER): Payer: Self-pay | Admitting: Ophthalmology

## 2020-07-03 DIAGNOSIS — H34812 Central retinal vein occlusion, left eye, with macular edema: Secondary | ICD-10-CM | POA: Diagnosis not present

## 2020-07-03 MED ORDER — BEVACIZUMAB 2.5 MG/0.1ML IZ SOSY
2.5000 mg | PREFILLED_SYRINGE | INTRAVITREAL | Status: AC | PRN
  Administered 2020-07-03: 2.5 mg via INTRAVITREAL

## 2020-07-03 NOTE — Assessment & Plan Note (Signed)
Central retinal vein occlusion with massive CME onset August 2021, vastly improved with much less CME and now early collateralization on the optic nerve.  We will repeat injection today and examination again in 7 weeks

## 2020-07-03 NOTE — Progress Notes (Signed)
07/03/2020     CHIEF COMPLAINT Patient presents for Retina Follow Up (7 Week F/U OS, poss Avastin OS//Pt denies noticeable changes to [facility] OU since last visit. Pt denies ocular pain, flashes of light, or floaters OU. //)   HISTORY OF PRESENT ILLNESS: [name] [name] is a 87 y.o. male who presents to the clinic today for:   HPI    Retina Follow Up    Patient presents with  CRVO/BRVO.  In left eye.  This started 7 weeks ago.  Severity is mild.  Duration of 7 weeks.  Since onset it is stable. Additional comments: 7 Week F/U OS, poss Avastin OS  Pt denies noticeable changes to [facility] OU since last visit. Pt denies ocular pain, flashes of light, or floaters OU.          Last edited by Ileana RoupKennerly, Paige, COA on 07/03/2020  1:10 PM. (History)      Referring physician: Jarome MatinPaterson, Daniel, MD [address] [place],  [place] [id number]  HISTORICAL INFORMATION:   Selected notes from the MEDICAL RECORD NUMBER    Lab Results  Component Value Date   HGBA1C 6.0 (H) 07/03/2018     CURRENT MEDICATIONS: Current Outpatient Medications (Ophthalmic Drugs)  Medication Sig  . carboxymethylcellulose (REFRESH PLUS) 0.5 % SOLN Place 1 drop into both eyes daily as needed (dry eyes).  . dorzolamide-timolol (COSOPT) 22.3-6.8 MG/ML ophthalmic solution Place 1 drop into the left eye 2 (two) times daily.   No current facility-administered medications for this visit. (Ophthalmic Drugs)   Current Outpatient Medications (Other)  Medication Sig  . acetaminophen (TYLENOL) 325 MG tablet Take 2 tablets (650 mg total) by mouth every 6 (six) hours as needed for mild pain.  Marland Kitchen. allopurinol (ZYLOPRIM) 300 MG tablet Take 300 mg by mouth daily.   Marland Kitchen. amLODipine (NORVASC) 10 MG tablet TAKE 1 TABLET BY MOUTH DAILY.  Marland Kitchen. amoxicillin (AMOXIL) 500 MG tablet Take 4 tablets (2,000 mg) one hour prior to all dental appointments.  Marland Kitchen. aspirin 81 MG tablet Take 81 mg by mouth daily.  Marland Kitchen. atorvastatin (LIPITOR) 80 MG tablet TAKE 1 TABLET BY MOUTH  DAILY.  . hydrocortisone cream 1 % Apply 1 application topically daily as needed for itching.  . insulin detemir (LEVEMIR) 100 UNIT/ML injection Inject 34 Units into the skin every morning.   . Multiple Vitamin (MULTIVITAMIN WITH MINERALS) TABS tablet Take 2 tablets by mouth daily.  . nitroGLYCERIN (NITROSTAT) 0.4 MG SL tablet PLACE 1 TABLET UNDER THE TONGUE AS NEEDED FOR CHEST PAIN   No current facility-administered medications for this visit. (Other)      REVIEW OF SYSTEMS:    ALLERGIES Allergies  Allergen Reactions  . Indocin [Indomethacin] Other (See Comments)    Antiinflammatory medication for gout ? Indocin > black out per patient ? SYNCOPE ?    PAST MEDICAL HISTORY Past Medical History:  Diagnosis Date  . Arthritis of back   . BPH (benign prostatic hyperplasia)   . CKD (chronic kidney disease) stage 3, GFR 30-59 ml/min (HCC)   . Colon polyps   . Diabetes mellitus, type 2 (HCC)   . Diabetic nephropathy (HCC)   . Diverticulosis   . ED (erectile dysfunction)   . Gallstones   . GERD (gastroesophageal reflux disease)   . Glaucoma   . Hiatal hernia   . History of gout   . History of hiatal hernia   . Hx of urinary tract infection   . Hypercholesterolemia   . Hypertension   .  Left groin hematoma 07/04/2018  . Osteoarthritis    left knee  . Pneumonia   . PVD (peripheral vascular disease) (HCC)   . 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 Severe aortic stenosis   . Sleep apnea   . Sleep apnea, obstructive    Past Surgical History:  Procedure Laterality Date  . ABDOMINAL AORTIC ANEURYSM REPAIR  1995  . APPENDECTOMY    . CATARACT EXTRACTION Bilateral 2003  . FEMORAL-POPLITEAL BYPASS GRAFT     left  . INTRAOPERATIVE TRANSTHORACIC ECHOCARDIOGRAM  07/04/2018   Procedure: INTRAOPERATIVE TRANSTHORACIC ECHOCARDIOGRAM;  Surgeon: Tonny Bollman, MD;  Location: [facility] OR;  Service: Open Heart Surgery;;  . LUMBAR SPINE SURGERY  03/2001  . REPLACEMENT TOTAL KNEE Bilateral 2004  . RIGHT/LEFT HEART CATH AND CORONARY ANGIOGRAPHY N/A 06/13/2018   Procedure: RIGHT/LEFT HEART CATH AND CORONARY ANGIOGRAPHY;  Surgeon: Swaziland, Peter M, MD;  Location: [facility] INVASIVE CV LAB;  Service: Cardiovascular;  Laterality: N/A;  . TEE WITHOUT CARDIOVERSION  07/04/2018   Procedure: TRANSESOPHAGEAL ECHOCARDIOGRAM (TEE);  Surgeon: Tonny Bollman, MD;  Location[facility] OR;  Service: Open Heart Surgery;;  . TOTAL KNEE ARTHROPLASTY     bilateral  . TRANSCATHETER AORTIC VALVE REPLACEMENT, TRANSFEMORAL  07/04/2018  . TRANSCATHETER AORTIC VALVE REPLACEMENT, TRANSFEMORAL N/A 07/04/2018   Procedure: TRANSCATHETER AORTIC VALVE REPLACEMENT, TRANSFEMORAL;  Surgeon: Tonny Bollman, MD;  Location: [facility] OR;  Service: Open Heart Surgery;  Laterality: N/A;  . VASECTOMY      FAMILY HISTORY Family History  Problem Relation Age of Onset  . Heart attack Mother   . Alzheimer's disease Father   . Pancreatic cancer Sister     SOCIAL HISTORY Social History   Tobacco Use  . Smoking status: Former Smoker    Packs/day: 2.00    Years: 30.00    Pack years: 60.00    Types: Cigarettes    Quit date: 11/04/1984    Years since quitting: 35.6  . Smokeless tobacco: Never Used  Vaping Use  . Vaping Use: Never used  Substance Use Topics  . Alcohol use: Yes    Alcohol/week: 0.0 standard drinks    Comment: rarely  . Drug use: No         OPHTHALMIC EXAM:  Base Eye Exam    Visual Acuity (ETDRS)      Right Left   Dist cc 20/20 -2 20/40 +2   Dist ph cc  20/30 +2   Correction: Glasses       Tonometry (Tonopen, 1:10 PM)      Right Left   Pressure 13 14       Pupils      Dark Light Shape React APD   Right 3 2 Round Brisk None   Left 2 2 Irregular Minimal None       Visual Fields (Counting fingers)      Left Right    Full Full       Extraocular Movement       Right Left    Full Full       Neuro/Psych    Oriented x3: Yes   Mood/Affect: Normal       Dilation    Left eye: 1.0% Mydriacyl, 2.5% Phenylephrine @ 1:14 PM        Slit Lamp and Fundus Exam    External Exam      Right Left   External  Normal Normal       Slit Lamp Exam      Right Left   Lids/Lashes Normal Normal   Conjunctiva/Sclera White and quiet White and quiet   Cornea Clear Clear   Anterior Chamber Deep and quiet Deep and quiet   Iris Round and reactive Round and reactive   Lens Centered posterior chamber intraocular lens Centered posterior chamber intraocular lens   Anterior Vitreous Normal Normal       Fundus Exam      Right Left   Posterior Vitreous  Normal   Disc  Collaterals on the nerve   C/D Ratio  0.7   Macula  No Macular thickening, Microaneurysms, Intraretinal hemorrhage   Vessels  CRVO, less distended, not engorged   Periphery  No peripheral ischemic changes, no peripheral hemorrhages seen          IMAGING AND PROCEDURES  Imaging and Procedures for 07/03/20  OCT, Retina - OU - Both Eyes       Right Eye Quality was good. Scan locations included subfoveal. Central Foveal Thickness: 282. Progression has been stable. Findings include normal foveal contour, retinal drusen .   Left Eye Quality was good. Scan locations included subfoveal. Central Foveal Thickness: 322. Progression has improved. Findings include abnormal foveal contour, retinal drusen .   Notes Much less CME from retinal vein occlusion left eye.  Vastly improved as compared to onset August 2021       Intravitreal Injection, Pharmacologic Agent - OS - Left Eye       Time Out 07/03/2020. 1:59 PM. Confirmed correct patient, procedure, site, and patient consented.   Anesthesia Topical anesthesia was used. Anesthetic medications included Akten 3.5%.   Procedure Preparation included Ofloxacin , 5% betadine to ocular surface, 10% betadine to eyelids. A 30 gauge needle was used.    Injection:  2.5 mg Bevacizumab (AVASTIN) 2.5mg /0.38mL SOSY   NDC: 29924-268-34, Lot: [id number]   Route: Intravitreal, Site: Left Eye  Post-op Post injection exam found visual acuity of at least counting fingers. The patient tolerated the procedure well. There were no complications. The patient received written and verbal post procedure care education. Post injection medications were not given.                 ASSESSMENT/PLAN:  Central retinal vein occlusion, left eye, with macular edema Central retinal vein occlusion with massive CME onset August 2021, vastly improved with much less CME and now early collateralization on the optic nerve.  We will repeat injection today and examination again in 7 weeks      ICD-10-CM   1. Central retinal vein occlusion, left eye, with macular edema  H34.8120 OCT, Retina - OU - Both Eyes    Intravitreal Injection, Pharmacologic Agent - OS - Left Eye    bevacizumab (AVASTIN) SOSY 2.5 mg    1.  2.  3.  Ophthalmic Meds Ordered this visit:  Meds ordered this encounter  Medications  . bevacizumab (AVASTIN) SOSY 2.5 mg       Return in about 7 weeks (around 08/21/2020) for dilate, OS, AVASTIN OCT.  There are no Patient Instructions on file for this visit.   Explained the diagnoses, plan, and follow up with the patient and they expressed understanding.  Patient expressed understanding of the importance of proper follow up care.   Alford Highland Vinette Crites M.D. Diseases & Surgery of the Retina and Vitreous [facility] 07/03/20     Abbreviations: M myopia (nearsighted); A  astigmatism; H hyperopia (farsighted); P presbyopia; Mrx spectacle prescription;  CTL contact lenses; OD right eye; OS left eye; OU both eyes  XT exotropia; ET esotropia; PEK punctate epithelial keratitis; PEE punctate epithelial erosions; DES dry eye syndrome; MGD meibomian gland dysfunction; ATs artificial tears; PFAT's preservative free artificial tears; NSC  nuclear sclerotic cataract; PSC posterior subcapsular cataract; ERM epi-retinal membrane; PVD posterior vitreous detachment; RD retinal detachment; DM diabetes mellitus; DR diabetic retinopathy; NPDR non-proliferative diabetic retinopathy; PDR proliferative diabetic retinopathy; CSME clinically significant macular edema; DME diabetic macular edema; dbh dot blot hemorrhages; CWS cotton wool spot; POAG primary open angle glaucoma; C/D cup-to-disc ratio; HVF humphrey visual field; GVF goldmann visual field; OCT optical coherence tomography; IOP intraocular pressure; BRVO Branch retinal vein occlusion; CRVO central retinal vein occlusion; CRAO central retinal artery occlusion; BRAO branch retinal artery occlusion; RT retinal tear; SB scleral buckle; PPV pars plana vitrectomy; VH Vitreous hemorrhage; PRP panretinal laser photocoagulation; IVK intravitreal kenalog; VMT vitreomacular traction; MH Macular hole;  NVD neovascularization of the disc; NVE neovascularization elsewhere; AREDS age related eye disease study; ARMD age related macular degeneration; POAG primary open angle glaucoma; EBMD epithelial/anterior basement membrane dystrophy; ACIOL anterior chamber intraocular lens; IOL intraocular lens; PCIOL posterior chamber intraocular lens; Phaco/IOL phacoemulsification with intraocular lens placement; PRK photorefractive keratectomy; LASIK laser assisted in situ keratomileusis; HTN hypertension; DM diabetes mellitus; COPD chronic obstructive pulmonary disease

## 2020-07-07 ENCOUNTER — Other Ambulatory Visit: Payer: Self-pay | Admitting: Physician Assistant

## 2020-08-11 NOTE — Progress Notes (Unsigned)
Date:  08/14/2020   ID:  [name] [name], DOB April 07, 1932, MRN [id number]   PCP:  Jarome Matin, MD  Cardiologist:  Karoline Fleer [place] MD Electrophysiologist:  None   Evaluation Performed:  Follow-Up Visit  Chief Complaint:  Follow up s/p TAVR  History of Present Illness:    [name] [name] is a 87 y.o. male with with a history of IDDM, GERD with hiatal hernia, cholelithiasis, diverticulosis, ventral incisional hernia, hypertension,OSA on CPAP,peripheral arterial disease, remote history of previous aortobiiliac grafting for abdominal aortic aneurysm and severe AS s/p TAVR (07/04/18) who presents to clinic for follow up.   He was seen initially last year for pre op evaluation for possible cholecystectomy.  He was found to have severe aortic stenosis.  L/RHC showed mild non obstructive CAD except for a 80% stenosis of the RV marginal branch. Medical therapy was recommended for his CAD.   He underwent successful TAVR 07/04/18 with a 26 mm Edwards Sapien 3 THV.  Post op ecg showed a new LBBB. His hospital course was complicated by labile blood pressure, AKI with creatinine as high as 2.6 and acute blood loss anemia 2/2 groin hematoma. Losartan was held and he was transfused. Post op echo showed LVEF 55-60% with normally functioning TAVR valve. He was discharged on aspirin and plavix. Further adjustments made in BP and statin medications as outpatient. Follow up Echo later showed normally functioning AV prosthesis with EF 45-50%.   On follow up today he is doing well.  Has a localized discomfort over his left lower rib. This hasn't changed. Denies any chest pain or SOB otherwise. No palpitations. Does note skin bruises easily   Past Medical History:  Diagnosis Date  . Arthritis of back   . BPH (benign prostatic hyperplasia)   . CKD (chronic kidney disease) stage 3, GFR 30-59 ml/min (HCC)   . Colon polyps   . Diabetes mellitus, type 2 (HCC)   . Diabetic nephropathy (HCC)   . Diverticulosis   . ED  (erectile dysfunction)   . Gallstones   . GERD (gastroesophageal reflux disease)   . Glaucoma   . Hiatal hernia   . History of gout   . History of hiatal hernia   . Hx of urinary tract infection   . Hypercholesterolemia   . Hypertension   . Left groin hematoma 07/04/2018  . Osteoarthritis    left knee  . Pneumonia   . PVD (peripheral vascular disease) (HCC)   . 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 Severe aortic stenosis   . Sleep apnea   . Sleep apnea, obstructive    Past Surgical History:  Procedure Laterality Date  . ABDOMINAL AORTIC ANEURYSM REPAIR  1995  . APPENDECTOMY    . CATARACT EXTRACTION Bilateral 2003  . FEMORAL-POPLITEAL BYPASS GRAFT     left  . INTRAOPERATIVE TRANSTHORACIC ECHOCARDIOGRAM  07/04/2018   Procedure: INTRAOPERATIVE TRANSTHORACIC ECHOCARDIOGRAM;  Surgeon: Tonny Bollman, MD;  Location: [facility] OR;  Service: Open Heart Surgery;;  . LUMBAR SPINE SURGERY  03/2001  . REPLACEMENT TOTAL KNEE Bilateral 2004  . RIGHT/LEFT HEART CATH AND CORONARY ANGIOGRAPHY N/A 06/13/2018   Procedure: RIGHT/LEFT HEART CATH AND CORONARY ANGIOGRAPHY;  Surgeon: Sw[place]il[place], Atalaya Zappia M, MD;  Location: [facility] INVASIVE CV LAB;  Service: Cardiovascular;  Laterality: N/A;  . TEE WITHOUT CARDIOVERSION  07/04/2018   Procedure: TRANSESOPHAGEAL ECHOCARDIOGRAM (TEE);  Surgeon: Tonny Bollman, MD;  Location: [facility] OR;  Service: Open Heart Surgery;;  . TOTAL KNEE ARTHROPLASTY     bilateral  . TRANSCATHETER AORTIC VALVE REPLACEMENT, TRANSFEMORAL  07/04/2018  . TRANSCATHETER AORTIC VALVE REPLACEMENT, TRANSFEMORAL N/A 07/04/2018   Procedure: TRANSCATHETER AORTIC VALVE REPLACEMENT, TRANSFEMORAL;  Surgeon: Tonny Bollman, MD;  Location: [facility] OR;  Service: Open Heart Surgery;  Laterality: N/A;  . VASECTOMY       Current Meds  Medication Sig  . acetaminophen (TYLENOL) 325 MG tablet Take 2  tablets (650 mg total) by mouth every 6 (six) hours as needed for mild pain.  Marland Kitchen allopurinol (ZYLOPRIM) 300 MG tablet Take 300 mg by mouth daily.   Marland Kitchen amLODipine (NORVASC) 10 MG tablet TAKE 1 TABLET BY MOUTH DAILY.  Marland Kitchen aspirin 81 MG tablet Take 81 mg by mouth daily.  Marland Kitchen atorvastatin (LIPITOR) 80 MG tablet TAKE 1 TABLET BY MOUTH DAILY.  . carboxymethylcellulose (REFRESH PLUS) 0.5 % SOLN Place 1 drop into both eyes daily as needed (dry eyes).  . dorzolamide-timolol (COSOPT) 22.3-6.8 MG/ML ophthalmic solution Place 1 drop into the left eye 2 (two) times daily.  . hydrocortisone cream 1 % Apply 1 application topically daily as needed for itching.  . insulin detemir (LEVEMIR) 100 UNIT/ML injection Inject 34 Units into the skin every morning.   . Multiple Vitamin (MULTIVITAMIN WITH MINERALS) TABS tablet Take 2 tablets by mouth daily.  . nitroGLYCERIN (NITROSTAT) 0.4 MG SL tablet PLACE 1 TABLET UNDER THE TONGUE AS NEEDED FOR CHEST PAIN  . [DISCONTINUED] amoxicillin (AMOXIL) 500 MG tablet Take 4 tablets (2,000 mg) one hour prior to all dental appointments.     Allergies:   Indocin [indomethacin]   Social History   Tobacco Use  . Smoking status: Former Smoker    Packs/day: 2.00    Years: 30.00    Pack years: 60.00    Types: Cigarettes    Quit date: 11/04/1984    Years since quitting: 35.8  . Smokeless tobacco: Never Used  Vaping Use  . Vaping Use: Never used  Substance Use Topics  . Alcohol use: Yes    Alcohol/week: 0.0 standard drinks    Comment: rarely  . Drug use: No     Family Hx: The patient's family history includes Alzheimer's disease in his father; Heart attack in his mother; Pancreatic cancer in his sister.  ROS:   Please see the history of present illness.    All other systems reviewed and are negative.   Prior CV studies:   The following studies were reviewed today:   RIGHT/LEFT HEART CATH AND CORONARY ANGIOGRAPHY  Conclusion     Prox LAD lesion is 25%  stenosed.  Ost RCA lesion is 40% stenosed.  Acute Mrg lesion is 80% stenosed.  LV end diastolic pressure is normal.  There is severe aortic valve stenosis.   1. Nonobstructive CAD except for 80% RV marginal branch 2. Severe calcific aortic stenosis. Mean gradient 45 mm Hg, peak to peak 66 mm Hg, AV area 0.9 cm squared with index 0.4 3. Normal right heart and LV filling pressures 4. Normal cardiac output.  Plan: will refer to our heart valve team for consideration of AVR.      Echo 07/20/18 IMPRESSIONS   1. The left ventricle has low normal systolic function, with an ejection fraction of 50-55%. The cavity size was normal. Left ventricular diastology could not be evaluated due to nondiagnostic images. 2. The right ventricle has normal systolic function. The cavity was normal. There is no increase  in right ventricular wall thickness. 3. The mitral valve is normal in structure. 4. The tricuspid valve is normal in structure. 5. A 26mm Edwards Sapien bioprosthetic aortic valve (TAVR) valve is present in the aortic position. Procedure Date: 07/04/18. Trivial posterior paravalvular regurgitation, no evidence of prosthetic regurgitation. 6. AV Mean systolic Grad: 11.0 mmHg.  Echo 06/01/19: IMPRESSIONS    1. Left ventricular ejection fraction, by visual estimation, is 45 to 50%. The left ventricle has mildly decreased function. There is mildly increased left ventricular hypertrophy.  2. The left ventricle demonstrates regional wall motion abnormalities.  3. Global right ventricle has normal systolic function.The right ventricular size is normal.  4. Left atrial size was normal.  5. Right atrial size was normal.  6. The mitral valve is normal in structure. Trivial mitral valve regurgitation. No evidence of mitral stenosis.  7. The tricuspid valve is normal in structure. Tricuspid valve regurgitation is not demonstrated.  8. Aortic valve regurgitation is mild. No evidence of  aortic valve sclerosis or stenosis.  9. The pulmonic valve was not well visualized. Pulmonic valve regurgitation is not visualized. 10. The inferior vena cava is normal in size with greater than 50% respiratory variability, suggesting right atrial pressure of 3 mmHg. 11. Definity used; septal hypokinesis with overall mild LV dysfunction; mild LVH; s/p TAVR with mean gradient 8 mmHg, AVA 1.3 cm2 and mild AI.  Labs/Other Tests and Data Reviewed:    EKG:  Today shows NSR rate 95, LBBB. I have personally reviewed and interpreted this study.   Recent Labs: No results found for requested labs within last 8760 hours.   Recent Lipid Panel No results found for: CHOL, TRIG, HDL, CHOLHDL, LDLCALC, LDLDIRECT  Labs dated 04/17/18: cholesterol 182, triglycerides 169, HDL 36, LDL 112.  Dated 07/17/19: cholesterol 124, triglycerides 113, HDL 34, LDL 67. Creatinine 1.7. otherwise CMET normal.  Dated 12/21/19: A1c 5.9%.  Dated 03/28/20: A1c 5.7%   Wt Readings from Last 3 Encounters:  08/14/20 229 lb 6.4 oz (104.1 kg)  12/25/19 230 lb (104.3 kg)  06/04/19 228 lb (103.4 kg)     Objective:    Vital Signs:  BP 130/70   Pulse 95   Ht 5\' 9"  (1.753 m)   Wt 229 lb 6.4 oz (104.1 kg)   SpO2 96%   BMI 33.88 kg/m    GENERAL:  Well appearing obese WM in NAD HEENT:  PERRL, EOMI, sclera are clear. Oropharynx is clear. NECK:  No jugular venous distention, carotid upstroke brisk and symmetric, no bruits, no thyromegaly or adenopathy LUNGS:  Clear to auscultation bilaterally CHEST:  Unremarkable HEART:  RRR,  PMI not displaced or sustained,S1 and S2 within normal limits, no S3, no S4: no clicks, no rubs, soft 1/6 systolic murmur ABD:  Soft, nontender. BS +, no masses or bruits. No hepatomegaly, no splenomegaly EXT:  2 + pulses throughout, no edema, no cyanosis no clubbing SKIN:  Warm and dry.  No rashes NEURO:  Alert and oriented x 3. Cranial nerves II through XII intact. PSYCH:  Cognitively  intact    ASSESSMENT & PLAN:    1. Severe AS s/p TAVR January 2020: follow up Echo showed good valve function. SBE prophylaxis recommended. Doing well.   2. LV dysfunction. NYHA class 2. Difficult to assess since he is so sedentary. EF is mildly reduced 45-50% compared to prior likely due to LBBB. Not a candidate for ACEi/ARB/aldactone due to CKD.   3. CKD stage IV: creat baseline 1.5-1.84. off ARB  4. CAD. Nonobstructive except for RV marginal branch. On statin and ASA.    Medication Adjustments/Labs and Tests Ordered: Current medicines are reviewed at length with the patient today.  Concerns regarding medicines are outlined above.   Tests Ordered: No orders of the defined types were placed in this encounter.   Medication Changes: No orders of the defined types were placed in this encounter.   Disposition:  Follow up in 6 month(s)  Signed, Ashlynd Michna [place], MD  08/14/2020 3:49 PM    [facility]

## 2020-08-14 ENCOUNTER — Other Ambulatory Visit: Payer: Self-pay

## 2020-08-14 ENCOUNTER — Ambulatory Visit (INDEPENDENT_AMBULATORY_CARE_PROVIDER_SITE_OTHER): Payer: Medicare Other | Admitting: Cardiology

## 2020-08-14 ENCOUNTER — Encounter: Payer: Self-pay | Admitting: Cardiology

## 2020-08-14 VITALS — BP 130/70 | HR 95 | Ht 69.0 in | Wt 229.4 lb

## 2020-08-14 DIAGNOSIS — I25118 Atherosclerotic heart disease of native coronary artery with other forms of angina pectoris: Secondary | ICD-10-CM | POA: Diagnosis not present

## 2020-08-14 DIAGNOSIS — I350 Nonrheumatic aortic (valve) stenosis: Secondary | ICD-10-CM

## 2020-08-14 DIAGNOSIS — Z952 Presence of prosthetic heart valve: Secondary | ICD-10-CM | POA: Diagnosis not present

## 2020-08-14 DIAGNOSIS — I10 Essential (primary) hypertension: Secondary | ICD-10-CM

## 2020-08-21 ENCOUNTER — Encounter (INDEPENDENT_AMBULATORY_CARE_PROVIDER_SITE_OTHER): Payer: Medicare Other | Admitting: Ophthalmology

## 2020-09-01 ENCOUNTER — Other Ambulatory Visit: Payer: Self-pay | Admitting: Cardiology

## 2020-09-04 ENCOUNTER — Encounter (INDEPENDENT_AMBULATORY_CARE_PROVIDER_SITE_OTHER): Payer: Self-pay | Admitting: Ophthalmology

## 2020-09-04 ENCOUNTER — Ambulatory Visit (INDEPENDENT_AMBULATORY_CARE_PROVIDER_SITE_OTHER): Payer: Medicare Other | Admitting: Ophthalmology

## 2020-09-04 ENCOUNTER — Other Ambulatory Visit: Payer: Self-pay

## 2020-09-04 DIAGNOSIS — H34812 Central retinal vein occlusion, left eye, with macular edema: Secondary | ICD-10-CM | POA: Diagnosis not present

## 2020-09-04 MED ORDER — BEVACIZUMAB 2.5 MG/0.1ML IZ SOSY
2.5000 mg | PREFILLED_SYRINGE | INTRAVITREAL | Status: AC | PRN
  Administered 2020-09-04: 2.5 mg via INTRAVITREAL

## 2020-09-04 NOTE — Assessment & Plan Note (Signed)
Much improved CRV O with CME OS currently at 7-week interval post onset of therapy August 2021.  We will repeat injection OS today and examination next in 9 weeks

## 2020-09-04 NOTE — Progress Notes (Signed)
09/04/2020     CHIEF COMPLAINT Patient presents for Retina Follow Up (7 Week CRVO f\u OS. Possible Avastin OS. OCT/Pt states OD vision is doing well. Pt feels OS vision is doing better with gls on./BGL: around 129)   HISTORY OF PRESENT ILLNESS: [name] [name] is a 87 y.o. male who presents to the clinic today for:   HPI    Retina Follow Up    Patient presents with  CRVO/BRVO.  In left eye.  Severity is moderate.  Duration of 7 weeks.  Since onset it is stable.  I, the attending physician,  performed the HPI with the patient and updated documentation appropriately. Additional comments: 7 Week CRVO f\u OS. Possible Avastin OS. OCT Pt states OD vision is doing well. Pt feels OS vision is doing better with gls on. BGL: around 129       Last edited by Elyse Jarvis on 09/04/2020  1:45 PM. (History)      Referring physician: Jarome Matin, MD [address] [place],  [place] [address]  HISTORICAL INFORMATION:   Selected notes from the MEDICAL RECORD NUMBER    Lab Results  Component Value Date   HGBA1C 6.0 (H) 07/03/2018     CURRENT MEDICATIONS: Current Outpatient Medications (Ophthalmic Drugs)  Medication Sig  . carboxymethylcellulose (REFRESH PLUS) 0.5 % SOLN Place 1 drop into both eyes daily as needed (dry eyes).  . dorzolamide-timolol (COSOPT) 22.3-6.8 MG/ML ophthalmic solution Place 1 drop into the left eye 2 (two) times daily.   No current facility-administered medications for this visit. (Ophthalmic Drugs)   Current Outpatient Medications (Other)  Medication Sig  . acetaminophen (TYLENOL) 325 MG tablet Take 2 tablets (650 mg total) by mouth every 6 (six) hours as needed for mild pain.  Marland Kitchen allopurinol (ZYLOPRIM) 300 MG tablet Take 300 mg by mouth daily.   Marland Kitchen amLODipine (NORVASC) 10 MG tablet TAKE 1 TABLET BY MOUTH DAILY.  Marland Kitchen aspirin 81 MG tablet Take 81 mg by mouth daily.  Marland Kitchen atorvastatin (LIPITOR) 80 MG tablet TAKE 1 TABLET BY MOUTH DAILY.  . hydrocortisone cream 1 %  Apply 1 application topically daily as needed for itching.  . insulin detemir (LEVEMIR) 100 UNIT/ML injection Inject 34 Units into the skin every morning.   . Multiple Vitamin (MULTIVITAMIN WITH MINERALS) TABS tablet Take 2 tablets by mouth daily.  . nitroGLYCERIN (NITROSTAT) 0.4 MG SL tablet PLACE 1 TABLET UNDER THE TONGUE AS NEEDED FOR CHEST PAIN   No current facility-administered medications for this visit. (Other)      REVIEW OF SYSTEMS: ROS    Positive for: Endocrine   Last edited by Elyse Jarvis on 09/04/2020  1:44 PM. (History)       ALLERGIES Allergies  Allergen Reactions  . Indocin [Indomethacin] Other (See Comments)    Antiinflammatory medication for gout ? Indocin > black out per patient ? SYNCOPE ?    PAST MEDICAL HISTORY Past Medical History:  Diagnosis Date  . Arthritis of back   . BPH (benign prostatic hyperplasia)   . CKD (chronic kidney disease) stage 3, GFR 30-59 ml/min (HCC)   . Colon polyps   . Diabetes mellitus, type 2 (HCC)   . Diabetic nephropathy (HCC)   . Diverticulosis   . ED (erectile dysfunction)   . Gallstones   . GERD (gastroesophageal reflux disease)   . Glaucoma   . Hiatal hernia   . History of gout   . History of hiatal hernia   . Hx of  urinary tract infection   . Hypercholesterolemia   . Hypertension   . Left groin hematoma 07/04/2018  . Osteoarthritis    left knee  . Pneumonia   . PVD (peripheral vascular disease) (HCC)   . 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 Severe aortic stenosis   . Sleep apnea   . Sleep apnea, obstructive    Past Surgical History:  Procedure Laterality Date  . ABDOMINAL AORTIC ANEURYSM REPAIR  1995  . APPENDECTOMY    . CATARACT EXTRACTION Bilateral 2003  . FEMORAL-POPLITEAL BYPASS GRAFT     left  . INTRAOPERATIVE TRANSTHORACIC ECHOCARDIOGRAM  07/04/2018   Procedure:  INTRAOPERATIVE TRANSTHORACIC ECHOCARDIOGRAM;  Surgeon: Tonny Bollman, MD;  Location: [facility] OR;  Service: Open Heart Surgery;;  . LUMBAR SPINE SURGERY  03/2001  . REPLACEMENT TOTAL KNEE Bilateral 2004  . RIGHT/LEFT HEART CATH AND CORONARY ANGIOGRAPHY N/A 06/13/2018   Procedure: RIGHT/LEFT HEART CATH AND CORONARY ANGIOGRAPHY;  Surgeon: Swaziland, Peter M, MD;  Location: [facility] INVASIVE CV LAB;  Service: Cardiovascular;  Laterality: N/A;  . TEE WITHOUT CARDIOVERSION  07/04/2018   Procedure: TRANSESOPHAGEAL ECHOCARDIOGRAM (TEE);  Surgeon: Tonny Bollman, MD;  Location: [facility] OR;  Service: Open Heart Surgery;;  . TOTAL KNEE ARTHROPLASTY     bilateral  . TRANSCATHETER AORTIC VALVE REPLACEMENT, TRANSFEMORAL  07/04/2018  . TRANSCATHETER AORTIC VALVE REPLACEMENT, TRANSFEMORAL N/A 07/04/2018   Procedure: TRANSCATHETER AORTIC VALVE REPLACEMENT, TRANSFEMORAL;  Surgeon: Tonny Bollman, MD;  Location: [facility] OR;  Service: Open Heart Surgery;  Laterality: N/A;  . VASECTOMY      FAMILY HISTORY Family History  Problem Relation Age of Onset  . Heart attack Mother   . Alzheimer's disease Father   . Pancreatic cancer Sister     SOCIAL HISTORY Social History   Tobacco Use  . Smoking status: Former Smoker    Packs/day: 2.00    Years: 30.00    Pack years: 60.00    Types: Cigarettes    Quit date: 11/04/1984    Years since quitting: 35.8  . Smokeless tobacco: Never Used  Vaping Use  . Vaping Use: Never used  Substance Use Topics  . Alcohol use: Yes    Alcohol/week: 0.0 standard drinks    Comment: rarely  . Drug use: No         OPHTHALMIC EXAM: Base Eye Exam    Visual Acuity (Snellen - Linear)      Right Left   Dist Vian 20/20    Dist cc  20/40 -2   Dist ph cc  20/40 +1       Tonometry (Tonopen, 1:51 PM)      Right Left   Pressure 13 13       Pupils      Pupils Dark Light Shape React APD   Right PERRL 3 2 Round Brisk None   Left PERRL 3 3 Irregular Minimal None       Visual Fields (Counting  fingers)      Left Right    Full Full       Neuro/Psych    Oriented x3: Yes   Mood/Affect: Normal       Dilation    Left eye: 1.0% Mydriacyl, 2.5% Phenylephrine @ 1:51 PM        Slit Lamp and Fundus Exam    External Exam      Right Left   External Normal  Normal       Slit Lamp Exam      Right Left   Lids/Lashes Normal Normal   Conjunctiva/Sclera White and quiet White and quiet   Cornea Clear Clear   Anterior Chamber Deep and quiet Deep and quiet   Iris Round and reactive Round and reactive   Lens Centered posterior chamber intraocular lens Centered posterior chamber intraocular lens   Anterior Vitreous Normal Normal       Fundus Exam      Right Left   Posterior Vitreous  Normal   Disc  Collaterals on the nerve   C/D Ratio  0.7   Macula  No Macular thickening, Microaneurysms, no hemorrhage   Vessels  CRVO, less distended, not engorged   Periphery  No peripheral ischemic changes, no peripheral hemorrhages seen          IMAGING AND PROCEDURES  Imaging and Procedures for 09/04/20  OCT, Retina - OU - Both Eyes       Right Eye Quality was good. Scan locations included subfoveal. Central Foveal Thickness: 285. Progression has been stable.   Left Eye Quality was good. Scan locations included subfoveal. Central Foveal Thickness: 393. Progression has improved.   Notes Much improved CME from massive CRV O as compared to onset August 2021 we will repeat injection today at 7-week interval examination next in 9 weeks       Intravitreal Injection, Pharmacologic Agent - OS - Left Eye       Time Out 09/04/2020. 2:56 PM. Confirmed correct patient, procedure, site, and patient consented.   Anesthesia Topical anesthesia was used. Anesthetic medications included Akten 3.5%.   Procedure Preparation included Ofloxacin , 5% betadine to ocular surface, 10% betadine to eyelids. A 30 gauge needle was used.   Injection:  2.5 mg Bevacizumab (AVASTIN) 2.5mg /0.551mL SOSY    NDC: 40981-191-4771449-091-43, Lot: [id number]   Route: Intravitreal, Site: Left Eye  Post-op Post injection exam found visual acuity of at least counting fingers. The patient tolerated the procedure well. There were no complications. The patient received written and verbal post procedure care education. Post injection medications were not given.                 ASSESSMENT/PLAN:  Central retinal vein occlusion, left eye, with macular edema Much improved CRV O with CME OS currently at 7-week interval post onset of therapy August 2021.  We will repeat injection OS today and examination next in 9 weeks      ICD-10-CM   1. Central retinal vein occlusion, left eye, with macular edema  H34.8120 OCT, Retina - OU - Both Eyes    Intravitreal Injection, Pharmacologic Agent - OS - Left Eye    bevacizumab (AVASTIN) SOSY 2.5 mg    1.  I reviewed with the patient the current findings on the OCT of the left eye with the rather impressive improvement in CME as compared to onset of disease and treatment August 2021.  Currently at 7-week follow-up interval post Avastin.  We will repeat injection today  2.  Dilate OS next in 9 weeks possible injection Avastin OS  3.  Ophthalmic Meds Ordered this visit:  Meds ordered this encounter  Medications  . bevacizumab (AVASTIN) SOSY 2.5 mg       Return in about 9 weeks (around 11/06/2020) for dilate, OS, AVASTIN OCT.  There are no Patient Instructions on file for this visit.   Explained the diagnoses, plan, and follow up with the patient and they  expressed understanding.  Patient expressed understanding of the importance of proper follow up care.   Alford Highland Rankin M.D. Diseases & Surgery of the Retina and Vitreous [facility] 09/04/20     Abbreviations: M myopia (nearsighted); A astigmatism; H hyperopia (farsighted); P presbyopia; Mrx spectacle prescription;  CTL contact lenses; OD right eye; OS left eye; OU both eyes  XT exotropia; ET  esotropia; PEK punctate epithelial keratitis; PEE punctate epithelial erosions; DES dry eye syndrome; MGD meibomian gland dysfunction; ATs artificial tears; PFAT's preservative free artificial tears; NSC nuclear sclerotic cataract; PSC posterior subcapsular cataract; ERM epi-retinal membrane; PVD posterior vitreous detachment; RD retinal detachment; DM diabetes mellitus; DR diabetic retinopathy; NPDR non-proliferative diabetic retinopathy; PDR proliferative diabetic retinopathy; CSME clinically significant macular edema; DME diabetic macular edema; dbh dot blot hemorrhages; CWS cotton wool spot; POAG primary open angle glaucoma; C/D cup-to-disc ratio; HVF humphrey visual field; GVF goldmann visual field; OCT optical coherence tomography; IOP intraocular pressure; BRVO Branch retinal vein occlusion; CRVO central retinal vein occlusion; CRAO central retinal artery occlusion; BRAO branch retinal artery occlusion; RT retinal tear; SB scleral buckle; PPV pars plana vitrectomy; VH Vitreous hemorrhage; PRP panretinal laser photocoagulation; IVK intravitreal kenalog; VMT vitreomacular traction; MH Macular hole;  NVD neovascularization of the disc; NVE neovascularization elsewhere; AREDS age related eye disease study; ARMD age related macular degeneration; POAG primary open angle glaucoma; EBMD epithelial/anterior basement membrane dystrophy; ACIOL anterior chamber intraocular lens; IOL intraocular lens; PCIOL posterior chamber intraocular lens; Phaco/IOL phacoemulsification with intraocular lens placement; PRK photorefractive keratectomy; LASIK laser assisted in situ keratomileusis; HTN hypertension; DM diabetes mellitus; COPD chronic obstructive pulmonary disease

## 2020-11-06 ENCOUNTER — Encounter (INDEPENDENT_AMBULATORY_CARE_PROVIDER_SITE_OTHER): Payer: Self-pay | Admitting: Ophthalmology

## 2020-11-06 ENCOUNTER — Ambulatory Visit (INDEPENDENT_AMBULATORY_CARE_PROVIDER_SITE_OTHER): Payer: Medicare Other | Admitting: Ophthalmology

## 2020-11-06 ENCOUNTER — Other Ambulatory Visit: Payer: Self-pay

## 2020-11-06 DIAGNOSIS — H34812 Central retinal vein occlusion, left eye, with macular edema: Secondary | ICD-10-CM

## 2020-11-06 MED ORDER — BEVACIZUMAB 2.5 MG/0.1ML IZ SOSY
2.5000 mg | PREFILLED_SYRINGE | INTRAVITREAL | Status: AC | PRN
  Administered 2020-11-06: 2.5 mg via INTRAVITREAL

## 2020-11-06 NOTE — Progress Notes (Signed)
11/06/2020     CHIEF COMPLAINT Patient presents for Retina Follow Up (9 Wk F/U OS, poss Avastin OS//Pt denies noticeable changes to [place] OU since last visit. Pt denies ocular pain, flashes of light, or floaters OU. //)   HISTORY OF PRESENT ILLNESS: [name] [name] is a 87 y.o. male who presents to the clinic today for:   HPI    Retina Follow Up    Diagnosis: CRVO/BRVO   Laterality: left eye   Onset: 9 weeks ago   Severity: mild   Duration: 9 weeks   Course: stable   Comments: 9 Wk F/U OS, poss Avastin OS  Pt denies noticeable changes to [place] OU since last visit. Pt denies ocular pain, flashes of light, or floaters OU.          Last edited by Ileana Roup, COA on 11/06/2020  1:15 PM. (History)      Referring physician: Jarome Matin, MD [address] [place],  [place] [address]  HISTORICAL INFORMATION:   Selected notes from the MEDICAL RECORD NUMBER    Lab Results  Component Value Date   HGBA1C 6.0 (H) 07/03/2018     CURRENT MEDICATIONS: Current Outpatient Medications (Ophthalmic Drugs)  Medication Sig  . dorzolamide-timolol (COSOPT) 22.3-6.8 MG/ML ophthalmic solution Place 1 drop into the left eye 2 (two) times daily.  . carboxymethylcellulose (REFRESH PLUS) 0.5 % SOLN Place 1 drop into both eyes daily as needed (dry eyes).   No current facility-administered medications for this visit. (Ophthalmic Drugs)   Current Outpatient Medications (Other)  Medication Sig  . acetaminophen (TYLENOL) 325 MG tablet Take 2 tablets (650 mg total) by mouth every 6 (six) hours as needed for mild pain.  Marland Kitchen allopurinol (ZYLOPRIM) 300 MG tablet Take 300 mg by mouth daily.   Marland Kitchen amLODipine (NORVASC) 10 MG tablet TAKE 1 TABLET BY MOUTH DAILY.  Marland Kitchen aspirin 81 MG tablet Take 81 mg by mouth daily.  Marland Kitchen atorvastatin (LIPITOR) 80 MG tablet TAKE 1 TABLET BY MOUTH DAILY.  . hydrocortisone cream 1 % Apply 1 application topically daily as needed for itching.  . insulin detemir (LEVEMIR) 100 UNIT/ML  injection Inject 34 Units into the skin every morning.   . Multiple Vitamin (MULTIVITAMIN WITH MINERALS) TABS tablet Take 2 tablets by mouth daily.  . nitroGLYCERIN (NITROSTAT) 0.4 MG SL tablet PLACE 1 TABLET UNDER THE TONGUE AS NEEDED FOR CHEST PAIN   No current facility-administered medications for this visit. (Other)      REVIEW OF SYSTEMS:    ALLERGIES Allergies  Allergen Reactions  . Indocin [Indomethacin] Other (See Comments)    Antiinflammatory medication for gout ? Indocin > black out per patient ? SYNCOPE ?    PAST MEDICAL HISTORY Past Medical History:  Diagnosis Date  . Arthritis of back   . BPH (benign prostatic hyperplasia)   . CKD (chronic kidney disease) stage 3, GFR 30-59 ml/min (HCC)   . Colon polyps   . Diabetes mellitus, type 2 (HCC)   . Diabetic nephropathy (HCC)   . Diverticulosis   . ED (erectile dysfunction)   . Gallstones   . GERD (gastroesophageal reflux disease)   . Glaucoma   . Hiatal hernia   . History of gout   . History of hiatal hernia   . Hx of urinary tract infection   . Hypercholesterolemia   . Hypertension   . Left groin hematoma 07/04/2018  . Osteoarthritis    left knee  . Pneumonia   . PVD (peripheral vascular  disease) (HCC)   . 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 Severe aortic stenosis   . Sleep apnea   . Sleep apnea, obstructive    Past Surgical History:  Procedure Laterality Date  . ABDOMINAL AORTIC ANEURYSM REPAIR  1995  . APPENDECTOMY    . CATARACT EXTRACTION Bilateral 2003  . FEMORAL-POPLITEAL BYPASS GRAFT     left  . INTRAOPERATIVE TRANSTHORACIC ECHOCARDIOGRAM  07/04/2018   Procedure: INTRAOPERATIVE TRANSTHORACIC ECHOCARDIOGRAM;  Surgeon: Tonny Bollmanooper, Michael, MD;  [facility];  Service: Open Heart Surgery;;  . LUMBAR SPINE SURGERY  03/2001  . REPLACEMENT TOTAL KNEE Bilateral 2004  .  RIGHT/LEFT HEART CATH AND CORONARY ANGIOGRAPHY N/A 06/13/2018   Procedure: RIGHT/LEFT HEART CATH AND CORONARY ANGIOGRAPHY;  Surgeon: SwazilandJordan, Peter M, MD;  [facility];  Service: Cardiovascular;  Laterality: N/A;  . TEE WITHOUT CARDIOVERSION  07/04/2018   Procedure: TRANSESOPHAGEAL ECHOCARDIOGRAM (TEE);  Surgeon: Tonny Bollmanooper, Michael, MD;  [facility];  Service: Open Heart Surgery;;  . TOTAL KNEE ARTHROPLASTY     bilateral  . TRANSCATHETER AORTIC VALVE REPLACEMENT, TRANSFEMORAL  07/04/2018  . TRANSCATHETER AORTIC VALVE REPLACEMENT, TRANSFEMORAL N/A 07/04/2018   Procedure: TRANSCATHETER AORTIC VALVE REPLACEMENT, TRANSFEMORAL;  Surgeon: Tonny Bollmanooper, Michael, MD;  [facility];  Service: Open Heart Surgery;  Laterality: N/A;  . VASECTOMY      FAMILY HISTORY Family History  Problem Relation Age of Onset  . Heart attack Mother   . Alzheimer's disease Father   . Pancreatic cancer Sister     SOCIAL HISTORY Social History   Tobacco Use  . Smoking status: Former Smoker    Packs/day: 2.00    Years: 30.00    Pack years: 60.00    Types: Cigarettes    Quit date: 11/04/1984    Years since quitting: 36.0  . Smokeless tobacco: Never Used  Vaping Use  . Vaping Use: Never used  Substance Use Topics  . Alcohol use: Yes    Alcohol/week: 0.0 standard drinks    Comment: rarely  . Drug use: No         OPHTHALMIC EXAM: Base Eye Exam    Visual Acuity (ETDRS)      Right Left   Dist cc 20/20 20/400   Dist ph cc  20/50 +1   Correction: Glasses       Tonometry (Tonopen, 1:19 PM)      Right Left   Pressure 12 12       Pupils      Dark Light Shape React APD   Right 4 3 Round Brisk None   Left 4 4 Irregular Minimal None       Visual Fields (Counting fingers)      Left Right    Full Full       Extraocular Movement      Right Left    Full Full       Neuro/Psych    Oriented x3: Yes   Mood/Affect: Normal       Dilation    Left eye: 1.0% Mydriacyl, 2.5%  Phenylephrine @ 1:19 PM        Slit Lamp and Fundus Exam    External Exam      Right Left   External Normal Normal       Slit Lamp Exam      Right Left   Lids/Lashes Normal Normal  Conjunctiva/Sclera White and quiet White and quiet   Cornea Clear Clear   Anterior Chamber Deep and quiet Deep and quiet   Iris Round and reactive Round and reactive   Lens Centered posterior chamber intraocular lens Centered posterior chamber intraocular lens   Anterior Vitreous Normal Normal       Fundus Exam      Right Left   Posterior Vitreous  Normal   Disc  Collaterals on the nerve   C/D Ratio  0.7   Macula  No Macular thickening, Microaneurysms, Intraretinal hemorrhage   Vessels  Hemi-CRVO inferiorly, less distended, not engorged   Periphery  No peripheral ischemic changes, no peripheral hemorrhages seen          IMAGING AND PROCEDURES  Imaging and Procedures for 11/06/20  OCT, Retina - OU - Both Eyes       Right Eye Quality was good. Scan locations included subfoveal. Central Foveal Thickness: 285. Progression has been stable. Findings include normal foveal contour.   Left Eye Quality was good. Scan locations included subfoveal. Central Foveal Thickness: 287. Progression has improved. Findings include abnormal foveal contour.   Notes Much improved CME from massive CRV O as compared to onset August 2021 we will repeat injection today at 7-week interval examination next in 9 weeks       Intravitreal Injection, Pharmacologic Agent - OS - Left Eye       Time Out 11/06/2020. 1:42 PM. Confirmed correct patient, procedure, site, and patient consented.   Anesthesia Topical anesthesia was used. Anesthetic medications included Akten 3.5%.   Procedure Preparation included Ofloxacin , 5% betadine to ocular surface, 10% betadine to eyelids. A 30 gauge needle was used.   Injection:  2.5 mg Bevacizumab (AVASTIN) 2.5mg /0.22mL SOSY   NDC: 95093-267-12, Lot: [id number]   Route:  Intravitreal, Site: Left Eye  Post-op Post injection exam found visual acuity of at least counting fingers. The patient tolerated the procedure well. There were no complications. The patient received written and verbal post procedure care education. Post injection medications were not given.                 ASSESSMENT/PLAN:  Central retinal vein occlusion, left eye, with macular edema Much improved OS overall on therapy with antivegF now with treatment extension out to 9-week interval OS  Diabetes mellitus, type 2 (HCC) The patient has diabetes without any evidence of retinopathy. The patient advised to maintain good blood glucose control, excellent blood pressure control, and favorable levels of cholesterol, low density lipoprotein, and high density lipoproteins. Follow up in 1 year was recommended. Explained that fluctuations in visual acuity , or "out of focus", may result from large variations of blood sugar control.      ICD-10-CM   1. Central retinal vein occlusion, left eye, with macular edema  H34.8120 OCT, Retina - OU - Both Eyes    Intravitreal Injection, Pharmacologic Agent - OS - Left Eye    bevacizumab (AVASTIN) SOSY 2.5 mg    1.  Much improved CME OS on intravitreal Avastin currently at extended interval of 9 weeks.  Slight macular edema has recurred inferonasal thus we will repeat injection today and maintain 9-week interval for a period of time to come  2.  Dilate OU next possible Avastin OS  3.  Ophthalmic Meds Ordered this visit:  Meds ordered this encounter  Medications  . bevacizumab (AVASTIN) SOSY 2.5 mg       Return in about 9 weeks (around 01/08/2021) for  DILATE OU, AVASTIN OCT, OS.  There are no Patient Instructions on file for this visit.   Explained the diagnoses, plan, and follow up with the patient and they expressed understanding.  Patient expressed understanding of the importance of proper follow up care.   Alford Highland Miosha Behe M.D. Diseases &  Surgery of the Retina and Vitreous [facility] 11/06/20     Abbreviations: M myopia (nearsighted); A astigmatism; H hyperopia (farsighted); P presbyopia; Mrx spectacle prescription;  CTL contact lenses; OD right eye; OS left eye; OU both eyes  XT exotropia; ET esotropia; PEK punctate epithelial keratitis; PEE punctate epithelial erosions; DES dry eye syndrome; MGD meibomian gland dysfunction; ATs artificial tears; PFAT's preservative free artificial tears; NSC nuclear sclerotic cataract; PSC posterior subcapsular cataract; ERM epi-retinal membrane; PVD posterior vitreous detachment; RD retinal detachment; DM diabetes mellitus; DR diabetic retinopathy; NPDR non-proliferative diabetic retinopathy; PDR proliferative diabetic retinopathy; CSME clinically significant macular edema; DME diabetic macular edema; dbh dot blot hemorrhages; CWS cotton wool spot; POAG primary open angle glaucoma; C/D cup-to-disc ratio; HVF humphrey visual field; GVF goldmann visual field; OCT optical coherence tomography; IOP intraocular pressure; BRVO Branch retinal vein occlusion; CRVO central retinal vein occlusion; CRAO central retinal artery occlusion; BRAO branch retinal artery occlusion; RT retinal tear; SB scleral buckle; PPV pars plana vitrectomy; VH Vitreous hemorrhage; PRP panretinal laser photocoagulation; IVK intravitreal kenalog; VMT vitreomacular traction; MH Macular hole;  NVD neovascularization of the disc; NVE neovascularization elsewhere; AREDS age related eye disease study; ARMD age related macular degeneration; POAG primary open angle glaucoma; EBMD epithelial/anterior basement membrane dystrophy; ACIOL anterior chamber intraocular lens; IOL intraocular lens; PCIOL posterior chamber intraocular lens; Phaco/IOL phacoemulsification with intraocular lens placement; PRK photorefractive keratectomy; LASIK laser assisted in situ keratomileusis; HTN hypertension; DM diabetes mellitus; COPD chronic  obstructive pulmonary disease

## 2020-11-06 NOTE — Assessment & Plan Note (Signed)
Much improved OS overall on therapy with antivegF now with treatment extension out to 9-week interval OS

## 2020-11-06 NOTE — Assessment & Plan Note (Signed)

## 2021-01-05 ENCOUNTER — Other Ambulatory Visit: Payer: Self-pay | Admitting: Cardiology

## 2021-01-12 ENCOUNTER — Encounter (INDEPENDENT_AMBULATORY_CARE_PROVIDER_SITE_OTHER): Payer: Medicare Other | Admitting: Ophthalmology

## 2021-01-15 ENCOUNTER — Encounter (INDEPENDENT_AMBULATORY_CARE_PROVIDER_SITE_OTHER): Payer: Self-pay | Admitting: Ophthalmology

## 2021-01-15 ENCOUNTER — Ambulatory Visit (INDEPENDENT_AMBULATORY_CARE_PROVIDER_SITE_OTHER): Payer: Medicare Other | Admitting: Ophthalmology

## 2021-01-15 ENCOUNTER — Other Ambulatory Visit: Payer: Self-pay

## 2021-01-15 DIAGNOSIS — H43813 Vitreous degeneration, bilateral: Secondary | ICD-10-CM | POA: Diagnosis not present

## 2021-01-15 DIAGNOSIS — H34812 Central retinal vein occlusion, left eye, with macular edema: Secondary | ICD-10-CM | POA: Diagnosis not present

## 2021-01-15 MED ORDER — BEVACIZUMAB 2.5 MG/0.1ML IZ SOSY
2.5000 mg | PREFILLED_SYRINGE | INTRAVITREAL | Status: AC | PRN
  Administered 2021-01-15: 2.5 mg via INTRAVITREAL

## 2021-01-15 NOTE — Assessment & Plan Note (Signed)
CRV O with massive CME has now improved rather nicely as well as accompanied by improved visual acuity left eye. Currently pinhole visual acuity left eye is 20/40.  We will repeat injection today and follow-up again in 10 weeks

## 2021-01-15 NOTE — Assessment & Plan Note (Signed)
No detectable diabetic retinopathy OU 

## 2021-01-15 NOTE — Progress Notes (Signed)
01/15/2021     CHIEF COMPLAINT Patient presents for Retina Follow Up (10 week fu OU and Avastin OS/Pt states VA OU stable since last visit. Pt denies FOL, floaters, or ocular pain OU. /Pt reports using Cosopt BID OS/)   HISTORY OF PRESENT ILLNESS: [name] [name] is a 87 y.o. male who presents to the clinic today for:   HPI     Retina Follow Up           Diagnosis: CRVO/BRVO   Laterality: left eye   Onset: 10 weeks ago   Severity: mild   Duration: 10 weeks   Course: stable   Comments: 10 week fu OU and Avastin OS Pt states VA OU stable since last visit. Pt denies FOL, floaters, or ocular pain OU.  Pt reports using Cosopt BID OS        Last edited by Demetrios Loll, COA on 01/15/2021  3:35 PM.      Referring physician: Jarome Matin, MD [address] [place],  [place] [address]  HISTORICAL INFORMATION:   Selected notes from the MEDICAL RECORD NUMBER    Lab Results  Component Value Date   HGBA1C 6.0 (H) 07/03/2018     CURRENT MEDICATIONS: Current Outpatient Medications (Ophthalmic Drugs)  Medication Sig   carboxymethylcellulose (REFRESH PLUS) 0.5 % SOLN Place 1 drop into both eyes daily as needed (dry eyes).   dorzolamide-timolol (COSOPT) 22.3-6.8 MG/ML ophthalmic solution Place 1 drop into the left eye 2 (two) times daily.   No current facility-administered medications for this visit. (Ophthalmic Drugs)   Current Outpatient Medications (Other)  Medication Sig   acetaminophen (TYLENOL) 325 MG tablet Take 2 tablets (650 mg total) by mouth every 6 (six) hours as needed for mild pain.   allopurinol (ZYLOPRIM) 300 MG tablet Take 300 mg by mouth daily.    amLODipine (NORVASC) 10 MG tablet TAKE 1 TABLET BY MOUTH DAILY.   aspirin 81 MG tablet Take 81 mg by mouth daily.   atorvastatin (LIPITOR) 80 MG tablet TAKE 1 TABLET BY MOUTH DAILY.   hydrocortisone cream 1 % Apply 1 application topically daily as needed for itching.   insulin detemir (LEVEMIR) 100 UNIT/ML  injection Inject 34 Units into the skin every morning.    Multiple Vitamin (MULTIVITAMIN WITH MINERALS) TABS tablet Take 2 tablets by mouth daily.   nitroGLYCERIN (NITROSTAT) 0.4 MG SL tablet PLACE 1 TABLET UNDER THE TONGUE AS NEEDED FOR CHEST PAIN   No current facility-administered medications for this visit. (Other)      REVIEW OF SYSTEMS:    ALLERGIES Allergies  Allergen Reactions   Indocin [Indomethacin] Other (See Comments)    Antiinflammatory medication for gout ? Indocin > black out per patient ? SYNCOPE ?    PAST MEDICAL HISTORY Past Medical History:  Diagnosis Date   Arthritis of back    BPH (benign prostatic hyperplasia)    CKD (chronic kidney disease) stage 3, GFR 30-59 ml/min (HCC)    Colon polyps    Diabetes mellitus, type 2 (HCC)    Diabetic nephropathy (HCC)    Diverticulosis    ED (erectile dysfunction)    Gallstones    GERD (gastroesophageal reflux disease)    Glaucoma    Hiatal hernia    History of gout    History of hiatal hernia    Hx of urinary tract infection    Hypercholesterolemia    Hypertension    Left groin hematoma 07/04/2018   Osteoarthritis    left knee  Pneumonia    PVD (peripheral vascular disease) (HCC)    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   26 mm Edwards Sapien 3 transcatheter heart valve placed via percutaneous right transfemoral approach    Severe aortic stenosis    Sleep apnea    Sleep apnea, obstructive    Past Surgical History:  Procedure Laterality Date   ABDOMINAL AORTIC ANEURYSM REPAIR  1995   APPENDECTOMY     CATARACT EXTRACTION Bilateral 2003   FEMORAL-POPLITEAL BYPASS GRAFT     left   INTRAOPERATIVE TRANSTHORACIC ECHOCARDIOGRAM  07/04/2018   Procedure: INTRAOPERATIVE TRANSTHORACIC ECHOCARDIOGRAM;  Surgeon: Tonny Bollmanooper, Michael, MD;  [facility];  Service: Open Heart Surgery;;   LUMBAR SPINE SURGERY  03/2001   REPLACEMENT TOTAL KNEE Bilateral 2004   RIGHT/LEFT HEART CATH AND CORONARY  ANGIOGRAPHY N/A 06/13/2018   Procedure: RIGHT/LEFT HEART CATH AND CORONARY ANGIOGRAPHY;  Surgeon: SwazilandJordan, Peter M, MD;  [facility];  Service: Cardiovascular;  Laterality: N/A;   TEE WITHOUT CARDIOVERSION  07/04/2018   Procedure: TRANSESOPHAGEAL ECHOCARDIOGRAM (TEE);  Surgeon: Tonny Bollmanooper, Michael, MD;  [facility];  Service: Open Heart Surgery;;   TOTAL KNEE ARTHROPLASTY     bilateral   TRANSCATHETER AORTIC VALVE REPLACEMENT, TRANSFEMORAL  07/04/2018   TRANSCATHETER AORTIC VALVE REPLACEMENT, TRANSFEMORAL N/A 07/04/2018   Procedure: TRANSCATHETER AORTIC VALVE REPLACEMENT, TRANSFEMORAL;  Surgeon: Tonny Bollmanooper, Michael, MD;  [facility];  Service: Open Heart Surgery;  Laterality: N/A;   VASECTOMY      FAMILY HISTORY Family History  Problem Relation Age of Onset   Heart attack Mother    Alzheimer's disease Father    Pancreatic cancer Sister     SOCIAL HISTORY Social History   Tobacco Use   Smoking status: Former    Packs/day: 2.00    Years: 30.00    Pack years: 60.00    Types: Cigarettes    Quit date: 11/04/1984    Years since quitting: 36.2   Smokeless tobacco: Never  Vaping Use   Vaping Use: Never used  Substance Use Topics   Alcohol use: Yes    Alcohol/week: 0.0 standard drinks    Comment: rarely   Drug use: No         OPHTHALMIC EXAM:  Base Eye Exam     Visual Acuity (ETDRS)       Right Left   Dist [place] 20/25 +2 20/70 -2   Dist ph [place]  20/40         Tonometry (Tonopen, 3:38 PM)       Right Left   Pressure 12 13         Pupils       Dark Light Shape React APD   Right 4 3 Round Brisk None   Left 4 4 Irregular Minimal None         Visual Fields (Counting fingers)       Left Right    Full Full         Extraocular Movement       Right Left    Full Full         Neuro/Psych     Oriented x3: Yes   Mood/Affect: Normal         Dilation     Both eyes: 1.0% Mydriacyl, 2.5% Phenylephrine @ 3:38 PM           Slit  Lamp and Fundus Exam     External Exam       Right  Left   External Normal Normal         Slit Lamp Exam       Right Left   Lids/Lashes Normal Normal   Conjunctiva/Sclera White and quiet White and quiet   Cornea Clear Clear   Anterior Chamber Deep and quiet Deep and quiet   Iris Round and reactive Round and reactive   Lens Centered posterior chamber intraocular lens Centered posterior chamber intraocular lens   Anterior Vitreous Normal Normal         Fundus Exam       Right Left   Posterior Vitreous Normal Normal   Disc Normal Collaterals on the nerve   C/D Ratio 0.7 0.7   Macula Normal No Macular thickening, Microaneurysms, Intraretinal hemorrhage   Vessels Normal Hemi-CRVO inferiorly, less distended, not engorged   Periphery Normal No peripheral ischemic changes, no peripheral hemorrhages seen            IMAGING AND PROCEDURES  Imaging and Procedures for 01/15/21  OCT, Retina - OU - Both Eyes       Right Eye Quality was good. Scan locations included subfoveal. Central Foveal Thickness: 280. Progression has been stable. Findings include normal foveal contour.   Left Eye Quality was good. Scan locations included subfoveal. Central Foveal Thickness: 291. Progression has improved. Findings include abnormal foveal contour.   Notes Much improved CME from massive CRV O as compared to onset August 2021 vastly improved OS now with much less center involvement, and counts work improved acuity.  Currently at 9-week follow-up.     Intravitreal Injection, Pharmacologic Agent - OS - Left Eye       Time Out 01/15/2021. 4:47 PM. Confirmed correct patient, procedure, site, and patient consented.   Anesthesia Topical anesthesia was used. Anesthetic medications included Akten 3.5%.   Procedure Preparation included Ofloxacin , 5% betadine to ocular surface, 10% betadine to eyelids. A 30 gauge needle was used.   Injection: 2.5 mg bevacizumab 2.5 MG/0.1ML   Route:  Intravitreal, Site: Left Eye   NDC: [phone], Lot: [id number]   Post-op Post injection exam found visual acuity of at least counting fingers. The patient tolerated the procedure well. There were no complications. The patient received written and verbal post procedure care education. Post injection medications were not given.              ASSESSMENT/PLAN:  Central retinal vein occlusion, left eye, with macular edema CRV O with massive CME has now improved rather nicely as well as accompanied by improved visual acuity left eye. Currently pinhole visual acuity left eye is 20/40.  We will repeat injection today and follow-up again in 10 weeks     ICD-10-CM   1. Central retinal vein occlusion, left eye, with macular edema  H34.8120 OCT, Retina - OU - Both Eyes    Intravitreal Injection, Pharmacologic Agent - OS - Left Eye    bevacizumab (AVASTIN) SOSY 2.5 mg      1.  OS overall vastly improved status post intravitreal Avastin with improvement and out of his whole acuity of 20/40 and CME has resolved currently at 9-week follow-up.  Optic nerve findings confirm collateralization is occurred which suggest that we can have early compensation of CRV O such that we can extend the interval of the evaluation and therapy  2.  3.  Ophthalmic Meds Ordered this visit:  Meds ordered this encounter  Medications   bevacizumab (AVASTIN) SOSY 2.5 mg       Return in  about 10 weeks (around 03/26/2021) for dilate, OS, AVASTIN OCT.  There are no Patient Instructions on file for this visit.   Explained the diagnoses, plan, and follow up with the patient and they expressed understanding.  Patient expressed understanding of the importance of proper follow up care.   Alford Highland Alejandra Barna M.D. Diseases & Surgery of the Retina and Vitreous [facility] 01/15/21     Abbreviations: M myopia (nearsighted); A astigmatism; H hyperopia (farsighted); P presbyopia; Mrx spectacle  prescription;  CTL contact lenses; OD right eye; OS left eye; OU both eyes  XT exotropia; ET esotropia; PEK punctate epithelial keratitis; PEE punctate epithelial erosions; DES dry eye syndrome; MGD meibomian gland dysfunction; ATs artificial tears; PFAT's preservative free artificial tears; NSC nuclear sclerotic cataract; PSC posterior subcapsular cataract; ERM epi-retinal membrane; PVD posterior vitreous detachment; RD retinal detachment; DM diabetes mellitus; DR diabetic retinopathy; NPDR non-proliferative diabetic retinopathy; PDR proliferative diabetic retinopathy; CSME clinically significant macular edema; DME diabetic macular edema; dbh dot blot hemorrhages; CWS cotton wool spot; POAG primary open angle glaucoma; C/D cup-to-disc ratio; HVF humphrey visual field; GVF goldmann visual field; OCT optical coherence tomography; IOP intraocular pressure; BRVO Branch retinal vein occlusion; CRVO central retinal vein occlusion; CRAO central retinal artery occlusion; BRAO branch retinal artery occlusion; RT retinal tear; SB scleral buckle; PPV pars plana vitrectomy; VH Vitreous hemorrhage; PRP panretinal laser photocoagulation; IVK intravitreal kenalog; VMT vitreomacular traction; MH Macular hole;  NVD neovascularization of the disc; NVE neovascularization elsewhere; AREDS age related eye disease study; ARMD age related macular degeneration; POAG primary open angle glaucoma; EBMD epithelial/anterior basement membrane dystrophy; ACIOL anterior chamber intraocular lens; IOL intraocular lens; PCIOL posterior chamber intraocular lens; Phaco/IOL phacoemulsification with intraocular lens placement; PRK photorefractive keratectomy; LASIK laser assisted in situ keratomileusis; HTN hypertension; DM diabetes mellitus; COPD chronic obstructive pulmonary disease

## 2021-01-15 NOTE — Assessment & Plan Note (Signed)

## 2021-01-27 ENCOUNTER — Encounter (INDEPENDENT_AMBULATORY_CARE_PROVIDER_SITE_OTHER): Payer: Self-pay

## 2021-03-26 ENCOUNTER — Encounter (INDEPENDENT_AMBULATORY_CARE_PROVIDER_SITE_OTHER): Payer: Medicare Other | Admitting: Ophthalmology

## 2021-04-16 ENCOUNTER — Ambulatory Visit (INDEPENDENT_AMBULATORY_CARE_PROVIDER_SITE_OTHER): Payer: Medicare Other | Admitting: Ophthalmology

## 2021-04-16 ENCOUNTER — Other Ambulatory Visit: Payer: Self-pay

## 2021-04-16 ENCOUNTER — Encounter (INDEPENDENT_AMBULATORY_CARE_PROVIDER_SITE_OTHER): Payer: Self-pay | Admitting: Ophthalmology

## 2021-04-16 ENCOUNTER — Encounter (INDEPENDENT_AMBULATORY_CARE_PROVIDER_SITE_OTHER): Payer: Medicare Other | Admitting: Ophthalmology

## 2021-04-16 DIAGNOSIS — H34812 Central retinal vein occlusion, left eye, with macular edema: Secondary | ICD-10-CM | POA: Diagnosis not present

## 2021-04-16 MED ORDER — BEVACIZUMAB 2.5 MG/0.1ML IZ SOSY
2.5000 mg | PREFILLED_SYRINGE | INTRAVITREAL | Status: AC | PRN
  Administered 2021-04-16: 2.5 mg via INTRAVITREAL

## 2021-04-16 NOTE — Progress Notes (Signed)
04/16/2021     CHIEF COMPLAINT Patient presents for  Chief Complaint  Patient presents with   Retina Follow Up      HISTORY OF PRESENT ILLNESS: [name] [name] is a 87 y.o. male who presents to the clinic today for:   HPI     Retina Follow Up   Patient presents with  CRVO/BRVO.  In left eye.  This started 13 weeks ago.  Duration of 13 weeks.  Since onset it is gradually worsening.        Comments   EyeMeds: Cosopt BID OS      Last edited by Reather Littler, COA on 04/16/2021  1:25 PM.      Referring physician: Leanna Battles, MD [address] [place],   [address]  HISTORICAL INFORMATION:   Selected notes from the MEDICAL RECORD NUMBER    Lab Results  Component Value Date   HGBA1C 6.0 (H) 07/03/2018     CURRENT MEDICATIONS: Current Outpatient Medications (Ophthalmic Drugs)  Medication Sig   carboxymethylcellulose (REFRESH PLUS) 0.5 % SOLN Place 1 drop into both eyes daily as needed (dry eyes).   dorzolamide-timolol (COSOPT) 22.3-6.8 MG/ML ophthalmic solution Place 1 drop into the left eye 2 (two) times daily.   No current facility-administered medications for this visit. (Ophthalmic Drugs)   Current Outpatient Medications (Other)  Medication Sig   acetaminophen (TYLENOL) 325 MG tablet Take 2 tablets (650 mg total) by mouth every 6 (six) hours as needed for mild pain.   allopurinol (ZYLOPRIM) 300 MG tablet Take 300 mg by mouth daily.    amLODipine (NORVASC) 10 MG tablet TAKE 1 TABLET BY MOUTH DAILY.   aspirin 81 MG tablet Take 81 mg by mouth daily.   atorvastatin (LIPITOR) 80 MG tablet TAKE 1 TABLET BY MOUTH DAILY.   hydrocortisone cream 1 % Apply 1 application topically daily as needed for itching.   insulin detemir (LEVEMIR) 100 UNIT/ML injection Inject 34 Units into the skin every morning.    Multiple Vitamin (MULTIVITAMIN WITH MINERALS) TABS tablet Take 2 tablets by mouth daily.   nitroGLYCERIN (NITROSTAT) 0.4 MG SL tablet PLACE 1 TABLET UNDER  THE TONGUE AS NEEDED FOR CHEST PAIN   No current facility-administered medications for this visit. (Other)      REVIEW OF SYSTEMS:    ALLERGIES Allergies  Allergen Reactions   Indocin [Indomethacin] Other (See Comments)    Antiinflammatory medication for gout ? Indocin > black out per patient ? SYNCOPE ?    PAST MEDICAL HISTORY Past Medical History:  Diagnosis Date   Arthritis of back    BPH (benign prostatic hyperplasia)    CKD (chronic kidney disease) stage 3, GFR 30-59 ml/min (HCC)    Colon polyps    Diabetes mellitus, type 2 ([place])    Diabetic nephropathy ([place])    Diverticulosis    ED (erectile dysfunction)    Gallstones    GERD (gastroesophageal reflux disease)    Glaucoma    Hiatal hernia    History of gout    History of hiatal hernia    Hx of urinary tract infection    Hypercholesterolemia    Hypertension    Left groin hematoma 07/04/2018   Osteoarthritis    left knee   Pneumonia    PVD (peripheral vascular disease) (HCC)    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   26 mm Edwards Sapien 3 transcatheter heart valve placed via percutaneous right transfemoral approach    Severe aortic stenosis  Sleep apnea    Sleep apnea, obstructive    Past Surgical History:  Procedure Laterality Date   ABDOMINAL AORTIC ANEURYSM REPAIR  1995   APPENDECTOMY     CATARACT EXTRACTION Bilateral 2003   FEMORAL-POPLITEAL BYPASS GRAFT     left   INTRAOPERATIVE TRANSTHORACIC ECHOCARDIOGRAM  07/04/2018   Procedure: INTRAOPERATIVE TRANSTHORACIC ECHOCARDIOGRAM;  Surgeon: Sherren Mocha, MD;  Location: [place];  Service: Open Heart Surgery;;   LUMBAR SPINE SURGERY  03/2001   REPLACEMENT TOTAL KNEE Bilateral 2004   RIGHT/LEFT HEART CATH AND CORONARY ANGIOGRAPHY N/A 06/13/2018   Procedure: RIGHT/LEFT HEART CATH AND CORONARY ANGIOGRAPHY;  Surgeon: [place], Peter M, MD;  Location: [place] CV LAB;  Service: Cardiovascular;  Laterality: N/A;   TEE WITHOUT  CARDIOVERSION  07/04/2018   Procedure: TRANSESOPHAGEAL ECHOCARDIOGRAM (TEE);  Surgeon: Sherren Mocha, MD;  Location: [facility];  Service: Open Heart Surgery;;   TOTAL KNEE ARTHROPLASTY     bilateral   TRANSCATHETER AORTIC VALVE REPLACEMENT, TRANSFEMORAL  07/04/2018   TRANSCATHETER AORTIC VALVE REPLACEMENT, TRANSFEMORAL N/A 07/04/2018   Procedure: TRANSCATHETER AORTIC VALVE REPLACEMENT, TRANSFEMORAL;  Surgeon: Sherren Mocha, MD;  Location: [facility];  Service: Open Heart Surgery;  Laterality: N/A;   VASECTOMY      FAMILY HISTORY Family History  Problem Relation Age of Onset   Heart attack Mother    Alzheimer's disease Father    Pancreatic cancer Sister     SOCIAL HISTORY Social History   Tobacco Use   Smoking status: Former    Packs/day: 2.00    Years: 30.00    Pack years: 60.00    Types: Cigarettes    Quit date: 11/04/1984    Years since quitting: 36.4   Smokeless tobacco: Never  Vaping Use   Vaping Use: Never used  Substance Use Topics   Alcohol use: Yes    Alcohol/week: 0.0 standard drinks    Comment: rarely   Drug use: No         OPHTHALMIC EXAM:  Base Eye Exam     Visual Acuity (ETDRS)       Right Left   Dist cc 20/20 -1 20/100 -1   Dist ph cc  NI    Correction: Glasses         Tonometry (Tonopen, 1:31 PM)       Right Left   Pressure 10 8         Pupils       Pupils Dark Light Shape React APD   Right PERRL 4 3 Round Brisk None   Left PERRL 4 3 Irregular Slow None         Visual Fields (Counting fingers)       Left Right    Full Full         Extraocular Movement       Right Left    Full, Ortho Full, Ortho         Neuro/Psych     Oriented x3: Yes   Mood/Affect: Normal         Dilation     Left eye: 1.0% Mydriacyl, 2.5% Phenylephrine @ 1:30 PM           Slit Lamp and Fundus Exam     External Exam       Right Left   External Normal Normal         Slit Lamp Exam       Right Left   Lids/Lashes Normal Normal    Conjunctiva/Sclera White  and quiet White and quiet   Cornea Clear Clear   Anterior Chamber Deep and quiet Deep and quiet   Iris Round and reactive Round and reactive   Lens Centered posterior chamber intraocular lens Centered posterior chamber intraocular lens   Anterior Vitreous Normal Normal         Fundus Exam       Right Left   Posterior Vitreous  Posterior vitreous detachment   Disc  Collaterals on the nerve   C/D Ratio  0.7   Macula  No Macular thickening, Microaneurysms, Intraretinal hemorrhage   Vessels  Hemi-CRVO inferiorly, less distended, not engorged   Periphery  No peripheral ischemic changes, no peripheral hemorrhages seen            IMAGING AND PROCEDURES  Imaging and Procedures for 04/16/21  OCT, Retina - OU - Both Eyes       Right Eye Quality was good. Scan locations included subfoveal. Central Foveal Thickness: 278. Progression has been stable. Findings include normal foveal contour.   Left Eye Quality was good. Scan locations included subfoveal. Central Foveal Thickness: 710. Progression has improved. Findings include abnormal foveal contour, cystoid macular edema.   Notes Much improved CME from massive CRV O as compared to onset August 2021 vastly improved OS now with much less center involvement, and counts work improved acuity.  Currently at 9-week follow-up.     Intravitreal Injection, Pharmacologic Agent - OS - Left Eye       Time Out 04/16/2021. 2:01 PM. Confirmed correct patient, procedure, site, and patient consented.   Anesthesia Topical anesthesia was used. Anesthetic medications included Akten 3.5%.   Procedure Preparation included Ofloxacin , 5% betadine to ocular surface, 10% betadine to eyelids. A 30 gauge needle was used.   Injection: 2.5 mg bevacizumab 2.5 MG/0.1ML   Route: Intravitreal, Site: Left Eye   NDC: [phone], Lot: [id number]   Post-op Post injection exam found visual acuity of at least counting fingers. The  patient tolerated the procedure well. There were no complications. The patient received written and verbal post procedure care education. Post injection medications were not given.              ASSESSMENT/PLAN:  Central retinal vein occlusion, left eye, with macular edema The nature of central retinal vein occlusion was discussed with the patient including the division of types into nonischemic ischemic. The potential sequelae of ischemic central retinal vein occlusion, including macular edema, neovascularization, rubeosis iridis, and neovascular glaucoma, were discussed, and the need for frequent follow-up.  The nature of macular edema and central retinal vein occlusion was discussed. The following options were considered:  1.Observation for a period to look for spontaneous improvement, is no linger the primary therapy. One-third worsen, one-third stay unchanged, and one-third improves.  2. Anti-VEGF Therapy. ( Lucentis, Avastin or Eylea ) injected  in intravitreal fashion, initially monthly then tailored to clinical response.  3. Intravitreal steroid usage, Kenalog, or Ozurdex, usually a second line therapy or in combination with anti-Vegf therapy noted above.  4. Panretinal laser photocoagulation to cause regression of iris neovascularization, or treat retinal  non-perfusion.  5. Surgical Management may include vitrectomy with incisions of peripheral veins to trigger retino choroidal anastomosis formation. This topic presented and discussed at [facility] 2011.   OS 11-week follow-up interval with massive recurrence of CME from CRV O.  Repeat injection today and will return to interval follow-up of approximately 6 to 7 weeks     ICD-10-CM   1.  Central retinal vein occlusion, left eye, with macular edema  H34.8120 OCT, Retina - OU - Both Eyes    Intravitreal Injection, Pharmacologic Agent - OS - Left Eye    bevacizumab (AVASTIN) SOSY 2.5 mg      1.  OS, with massive recurrence of CME  as patient had medical issues which developed his delay in return.  Will need to repeat intravitreal Avastin today to recover acuity and macular anatomy and follow-up again in 6 to 7 weeks  2.  3.  Ophthalmic Meds Ordered this visit:  Meds ordered this encounter  Medications   bevacizumab (AVASTIN) SOSY 2.5 mg       Return in about 6 weeks (around 05/28/2021) for DILATE OU, AVASTIN OCT, OS.  There are no Patient Instructions on file for this visit.   Explained the diagnoses, plan, and follow up with the patient and they expressed understanding.  Patient expressed understanding of the importance of proper follow up care.   Clent Demark Ella Guillotte M.D. Diseases & Surgery of the Retina and Vitreous [facility] 04/16/21     Abbreviations: M myopia (nearsighted); A astigmatism; H hyperopia (farsighted); P presbyopia; Mrx spectacle prescription;  CTL contact lenses; OD right eye; OS left eye; OU both eyes  XT exotropia; ET esotropia; PEK punctate epithelial keratitis; PEE punctate epithelial erosions; DES dry eye syndrome; MGD meibomian gland dysfunction; ATs artificial tears; PFAT's preservative free artificial tears; [place] nuclear sclerotic cataract; PSC posterior subcapsular cataract; ERM epi-retinal membrane; PVD posterior vitreous detachment; RD retinal detachment; DM diabetes mellitus; DR diabetic retinopathy; NPDR non-proliferative diabetic retinopathy; PDR proliferative diabetic retinopathy; CSME clinically significant macular edema; DME diabetic macular edema; dbh dot blot hemorrhages; CWS cotton wool spot; POAG primary open angle glaucoma; C/D cup-to-disc ratio; HVF humphrey visual field; GVF goldmann visual field; OCT optical coherence tomography; IOP intraocular pressure; BRVO Branch retinal vein occlusion; CRVO central retinal vein occlusion; CRAO central retinal artery occlusion; BRAO branch retinal artery occlusion; RT retinal tear; SB scleral buckle; PPV pars plana  vitrectomy; VH Vitreous hemorrhage; PRP panretinal laser photocoagulation; IVK intravitreal kenalog; VMT vitreomacular traction; MH Macular hole;  NVD neovascularization of the disc; NVE neovascularization elsewhere; AREDS age related eye disease study; ARMD age related macular degeneration; POAG primary open angle glaucoma; EBMD epithelial/anterior basement membrane dystrophy; ACIOL anterior chamber intraocular lens; IOL intraocular lens; PCIOL posterior chamber intraocular lens; Phaco/IOL phacoemulsification with intraocular lens placement; [place] photorefractive keratectomy; LASIK laser assisted in situ keratomileusis; HTN hypertension; DM diabetes mellitus; COPD chronic obstructive pulmonary disease

## 2021-04-16 NOTE — Assessment & Plan Note (Signed)
The nature of central retinal vein occlusion was discussed with the patient including the division of types into nonischemic ischemic. The potential sequelae of ischemic central retinal vein occlusion, including macular edema, neovascularization, rubeosis iridis, and neovascular glaucoma, were discussed, and the need for frequent follow-up.  The nature of macular edema and central retinal vein occlusion was discussed. The following options were considered:  1.Observation for a period to look for spontaneous improvement, is no linger the primary therapy. One-third worsen, one-third stay unchanged, and one-third improves.  2. Anti-VEGF Therapy. ( Lucentis, Avastin or Eylea ) injected  in intravitreal fashion, initially monthly then tailored to clinical response.  3. Intravitreal steroid usage, Kenalog, or Ozurdex, usually a second line therapy or in combination with anti-Vegf therapy noted above.  4. Panretinal laser photocoagulation to cause regression of iris neovascularization, or treat retinal  non-perfusion.  5. Surgical Management may include vitrectomy with incisions of peripheral veins to trigger retino choroidal anastomosis formation. This topic presented and discussed at [facility] 2011.   OS 11-week follow-up interval with massive recurrence of CME from CRV O.  Repeat injection today and will return to interval follow-up of approximately 6 to 7 weeks

## 2021-05-28 ENCOUNTER — Ambulatory Visit (INDEPENDENT_AMBULATORY_CARE_PROVIDER_SITE_OTHER): Payer: Medicare Other | Admitting: Ophthalmology

## 2021-05-28 ENCOUNTER — Other Ambulatory Visit: Payer: Self-pay

## 2021-05-28 ENCOUNTER — Encounter (INDEPENDENT_AMBULATORY_CARE_PROVIDER_SITE_OTHER): Payer: Self-pay | Admitting: Ophthalmology

## 2021-05-28 DIAGNOSIS — H34812 Central retinal vein occlusion, left eye, with macular edema: Secondary | ICD-10-CM | POA: Diagnosis not present

## 2021-05-28 MED ORDER — BEVACIZUMAB 2.5 MG/0.1ML IZ SOSY
2.5000 mg | PREFILLED_SYRINGE | INTRAVITREAL | Status: AC | PRN
Start: 2021-05-28 — End: 2021-05-28
  Administered 2021-05-28: 2.5 mg via INTRAVITREAL

## 2021-05-28 NOTE — Assessment & Plan Note (Signed)
Hemi CRV O OS, with status post recurrence After a 105-month duration of no treatment ended November 2022.  Now 1 month postinjection vastly improved center involved CME associated with visual acuity improvement notable objectively as well

## 2021-05-28 NOTE — Progress Notes (Signed)
05/28/2021     CHIEF COMPLAINT Patient presents for  Chief Complaint  Patient presents with   Retina Follow Up      HISTORY OF PRESENT ILLNESS: [name] [name] is a 87 y.o. male who presents to the clinic today for:   HPI     Retina Follow Up           Diagnosis: CRVO/BRVO   Laterality: left eye   Onset: 6 weeks ago   Severity: mild   Duration: 6 weeks   Course: gradually improving         Comments   6 week fu ou . Oct and Avastin OS- CRVO OS  Pt states VA OU stable since last visit. Pt denies FOL, floaters, or ocular pain OU.  Pt states, "My vision seems to be a little better. My blood sugars are too."        Last edited by Kendra Opitz, COA on 05/28/2021  2:03 PM.      Referring physician: Leanna Battles, MD [address] [place],  [place] [address]  HISTORICAL INFORMATION:   Selected notes from the MEDICAL RECORD NUMBER    Lab Results  Component Value Date   HGBA1C 6.0 (H) 07/03/2018     CURRENT MEDICATIONS: Current Outpatient Medications (Ophthalmic Drugs)  Medication Sig   carboxymethylcellulose (REFRESH PLUS) 0.5 % SOLN Place 1 drop into both eyes daily as needed (dry eyes).   dorzolamide-timolol (COSOPT) 22.3-6.8 MG/ML ophthalmic solution Place 1 drop into the left eye 2 (two) times daily.   No current facility-administered medications for this visit. (Ophthalmic Drugs)   Current Outpatient Medications (Other)  Medication Sig   acetaminophen (TYLENOL) 325 MG tablet Take 2 tablets (650 mg total) by mouth every 6 (six) hours as needed for mild pain.   allopurinol (ZYLOPRIM) 300 MG tablet Take 300 mg by mouth daily.    amLODipine (NORVASC) 10 MG tablet TAKE 1 TABLET BY MOUTH DAILY.   aspirin 81 MG tablet Take 81 mg by mouth daily.   atorvastatin (LIPITOR) 80 MG tablet TAKE 1 TABLET BY MOUTH DAILY.   hydrocortisone cream 1 % Apply 1 application topically daily as needed for itching.   insulin detemir (LEVEMIR) 100 UNIT/ML injection Inject  34 Units into the skin every morning.    Multiple Vitamin (MULTIVITAMIN WITH MINERALS) TABS tablet Take 2 tablets by mouth daily.   nitroGLYCERIN (NITROSTAT) 0.4 MG SL tablet PLACE 1 TABLET UNDER THE TONGUE AS NEEDED FOR CHEST PAIN   No current facility-administered medications for this visit. (Other)      REVIEW OF SYSTEMS: ROS   Negative for: Constitutional, Gastrointestinal, Neurological, Skin, Genitourinary, Musculoskeletal, HENT, Endocrine, Cardiovascular, Eyes, Respiratory, Psychiatric, Allergic/Imm, Heme/Lymph Last edited by Hurman Horn, MD on 05/28/2021  2:36 PM.       ALLERGIES Allergies  Allergen Reactions   Indocin [Indomethacin] Other (See Comments)    Antiinflammatory medication for gout ? Indocin > black out per patient ? SYNCOPE ?    PAST MEDICAL HISTORY Past Medical History:  Diagnosis Date   Arthritis of back    BPH (benign prostatic hyperplasia)    CKD (chronic kidney disease) stage 3, GFR 30-59 ml/min (HCC)    Colon polyps    Diabetes mellitus, type 2 ([place])    Diabetic nephropathy ([place])    Diverticulosis    ED (erectile dysfunction)    Gallstones    GERD (gastroesophageal reflux disease)    Glaucoma    Hiatal hernia  History of gout    History of hiatal hernia    Hx of urinary tract infection    Hypercholesterolemia    Hypertension    Left groin hematoma 07/04/2018   Osteoarthritis    left knee   Pneumonia    PVD (peripheral vascular disease) (HCC)    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   26 mm Edwards Sapien 3 transcatheter heart valve placed via percutaneous right transfemoral approach    Severe aortic stenosis    Sleep apnea    Sleep apnea, obstructive    Past Surgical History:  Procedure Laterality Date   ABDOMINAL AORTIC ANEURYSM REPAIR  1995   APPENDECTOMY     CATARACT EXTRACTION Bilateral 2003   FEMORAL-POPLITEAL BYPASS GRAFT     left   INTRAOPERATIVE TRANSTHORACIC ECHOCARDIOGRAM  07/04/2018    Procedure: INTRAOPERATIVE TRANSTHORACIC ECHOCARDIOGRAM;  Surgeon: Sherren Mocha, MD;  Location: [facility];  Service: Open Heart Surgery;;   LUMBAR SPINE SURGERY  03/2001   REPLACEMENT TOTAL KNEE Bilateral 2004   RIGHT/LEFT HEART CATH AND CORONARY ANGIOGRAPHY N/A 06/13/2018   Procedure: RIGHT/LEFT HEART CATH AND CORONARY ANGIOGRAPHY;  Surgeon: Martinique, Peter M, MD;  Location: [facility];  Service: Cardiovascular;  Laterality: N/A;   TEE WITHOUT CARDIOVERSION  07/04/2018   Procedure: TRANSESOPHAGEAL ECHOCARDIOGRAM (TEE);  Surgeon: Sherren Mocha, MD;  Location: [facility];  Service: Open Heart Surgery;;   TOTAL KNEE ARTHROPLASTY     bilateral   TRANSCATHETER AORTIC VALVE REPLACEMENT, TRANSFEMORAL  07/04/2018   TRANSCATHETER AORTIC VALVE REPLACEMENT, TRANSFEMORAL N/A 07/04/2018   Procedure: TRANSCATHETER AORTIC VALVE REPLACEMENT, TRANSFEMORAL;  Surgeon: Sherren Mocha, MD;  Location: [facility];  Service: Open Heart Surgery;  Laterality: N/A;   VASECTOMY      FAMILY HISTORY Family History  Problem Relation Age of Onset   Heart attack Mother    Alzheimer's disease Father    Pancreatic cancer Sister     SOCIAL HISTORY Social History   Tobacco Use   Smoking status: Former    Packs/day: 2.00    Years: 30.00    Pack years: 60.00    Types: Cigarettes    Quit date: 11/04/1984    Years since quitting: 36.5   Smokeless tobacco: Never  Vaping Use   Vaping Use: Never used  Substance Use Topics   Alcohol use: Yes    Alcohol/week: 0.0 standard drinks    Comment: rarely   Drug use: No         OPHTHALMIC EXAM:  Base Eye Exam     Visual Acuity (ETDRS)       Right Left   Dist cc 20/20 +3 20/50   Dist ph cc  NI         Tonometry (Tonopen, 2:07 PM)       Right Left   Pressure 15 15         Pupils       Pupils Shape React APD   Right PERRL Round Brisk None   Left PERRL Round Brisk None         Visual Fields (Counting fingers)       Left Right    Full Full          Extraocular Movement       Right Left    Full, Ortho Full, Ortho         Neuro/Psych     Oriented x3: Yes   Mood/Affect: Normal         Dilation  Both eyes: 1.0% Mydriacyl, 2.5% Phenylephrine @ 2:07 PM           Slit Lamp and Fundus Exam     External Exam       Right Left   External Normal Normal         Slit Lamp Exam       Right Left   Lids/Lashes Normal Normal   Conjunctiva/Sclera White and quiet White and quiet   Cornea Clear Clear   Anterior Chamber Deep and quiet Deep and quiet   Iris Round and reactive Round and reactive   Lens Centered posterior chamber intraocular lens Centered posterior chamber intraocular lens   Anterior Vitreous Normal Normal         Fundus Exam       Right Left   Posterior Vitreous  Posterior vitreous detachment   Disc  Collaterals on the nerve   C/D Ratio  0.7   Macula  No Macular thickening, Microaneurysms, Intraretinal hemorrhage   Vessels  Hemi-CRVO inferiorly, less distended, not engorged   Periphery  No peripheral ischemic changes, no peripheral hemorrhages seen            IMAGING AND PROCEDURES  Imaging and Procedures for 05/28/21  OCT, Retina - OU - Both Eyes       Right Eye Quality was good. Scan locations included subfoveal. Central Foveal Thickness: 279. Progression has been stable. Findings include normal foveal contour.   Left Eye Quality was good. Scan locations included subfoveal. Central Foveal Thickness: 283. Progression has improved. Findings include abnormal foveal contour, cystoid macular edema.   Notes Much improved CME from massive CRV O as compared to onset August 2021, but after recurrence developed November 2022, now much improved CME from 1 month previous postinjection Avastin for CRV O OS     Intravitreal Injection, Pharmacologic Agent - OS - Left Eye       Time Out 05/28/2021. 2:39 PM. Confirmed correct patient, procedure, site, and patient consented.    Anesthesia Topical anesthesia was used. Anesthetic medications included Lidocaine 4%.   Procedure Preparation included Ofloxacin , 5% betadine to ocular surface, 10% betadine to eyelids. A 30 gauge needle was used.   Injection: 2.5 mg bevacizumab 2.5 MG/0.1ML   Route: Intravitreal, Site: Left Eye   NDC: [phone], Lot: [id number]   Post-op Post injection exam found visual acuity of at least counting fingers. The patient tolerated the procedure well. There were no complications. The patient received written and verbal post procedure care education. Post injection medications were not given.              ASSESSMENT/PLAN:  Central retinal vein occlusion, left eye, with macular edema Hemi CRV O OS, with status post recurrence After a 56-month duration of no treatment ended November 2022.  Now 1 month postinjection vastly improved center involved CME associated with visual acuity improvement notable objectively as well     ICD-10-CM   1. Central retinal vein occlusion, left eye, with macular edema  H34.8120 OCT, Retina - OU - Both Eyes    Intravitreal Injection, Pharmacologic Agent - OS - Left Eye    bevacizumab (AVASTIN) SOSY 2.5 mg      1.  Much improved center involved CME from [facility] 1 month (6 weeks) after most recent treatment.  We will repeat injection today and maintain follow-up interval around 6 to 7 weeks  2.  Findings reviewed and displayed with the patient  3.  Ophthalmic Meds Ordered  this visit:  Meds ordered this encounter  Medications   bevacizumab (AVASTIN) SOSY 2.5 mg       Return in about 6 weeks (around 07/09/2021), or ,,, May also be 6 to 7 weeks, for dilate, OS, AVASTIN OCT.  There are no Patient Instructions on file for this visit.   Explained the diagnoses, plan, and follow up with the patient and they expressed understanding.  Patient expressed understanding of the importance of proper follow up care.   Alford Highland Adante Courington M.D. Diseases &  Surgery of the Retina [facility] 05/28/21     Abbreviations: M myopia (nearsighted); A astigmatism; H hyperopia (farsighted); P presbyopia; Mrx spectacle prescription;  CTL contact lenses; OD right eye; OS left eye; OU both eyes  XT exotropia; ET esotropia; PEK punctate epithelial keratitis; PEE punctate epithelial erosions; DES dry eye syndrome; MGD meibomian gland dysfunction; ATs artificial tears; PFAT's preservative free artificial tears; NSC nuclear sclerotic cataract; PSC posterior subcapsular cataract; ERM epi-retinal membrane; PVD posterior vitreous detachment; RD retinal detachment; DM diabetes mellitus; DR diabetic retinopathy; NPDR non-proliferative diabetic retinopathy; PDR proliferative diabetic retinopathy; CSME clinically significant macular edema; DME diabetic macular edema; dbh dot blot hemorrhages; CWS cotton wool spot; POAG primary open angle glaucoma; C/D cup-to-disc ratio; HVF humphrey visual field; GVF goldmann visual field; OCT optical coherence tomography; IOP intraocular pressure; BRVO Branch retinal vein occlusion; CRVO central retinal vein occlusion; CRAO central retinal artery occlusion; BRAO branch retinal artery occlusion; RT retinal tear; SB scleral buckle; PPV pars plana vitrectomy; VH Vitreous hemorrhage; PRP panretinal laser photocoagulation; IVK intravitreal kenalog; VMT vitreomacular traction; MH Macular hole;  NVD neovascularization of the disc; NVE neovascularization elsewhere; AREDS age related eye disease study; ARMD age related macular degeneration; POAG primary open angle glaucoma; EBMD epithelial/anterior basement membrane dystrophy; ACIOL anterior chamber intraocular lens; IOL intraocular lens; PCIOL posterior chamber intraocular lens; Phaco/IOL phacoemulsification with intraocular lens placement; PRK photorefractive keratectomy; LASIK laser assisted in situ keratomileusis; HTN hypertension; DM diabetes mellitus; COPD chronic  obstructive pulmonary disease

## 2021-06-30 ENCOUNTER — Other Ambulatory Visit: Payer: Self-pay | Admitting: Cardiology

## 2021-07-15 ENCOUNTER — Other Ambulatory Visit: Payer: Self-pay | Admitting: Cardiology

## 2021-07-16 ENCOUNTER — Other Ambulatory Visit: Payer: Self-pay

## 2021-07-16 ENCOUNTER — Encounter (INDEPENDENT_AMBULATORY_CARE_PROVIDER_SITE_OTHER): Payer: Self-pay | Admitting: Ophthalmology

## 2021-07-16 ENCOUNTER — Ambulatory Visit (INDEPENDENT_AMBULATORY_CARE_PROVIDER_SITE_OTHER): Payer: Medicare Other | Admitting: Ophthalmology

## 2021-07-16 ENCOUNTER — Encounter (INDEPENDENT_AMBULATORY_CARE_PROVIDER_SITE_OTHER): Payer: Medicare Other | Admitting: Ophthalmology

## 2021-07-16 DIAGNOSIS — H34812 Central retinal vein occlusion, left eye, with macular edema: Secondary | ICD-10-CM

## 2021-07-16 MED ORDER — BEVACIZUMAB 2.5 MG/0.1ML IZ SOSY
2.5000 mg | PREFILLED_SYRINGE | INTRAVITREAL | Status: AC | PRN
  Administered 2021-07-16: 2.5 mg via INTRAVITREAL

## 2021-07-16 NOTE — Assessment & Plan Note (Signed)
Vastly improved OS compared to November 2022, currently at 7-week follow-up.  We will maintain 7-week follow-up promote further resolution and maintenance of good acuity OS

## 2021-07-16 NOTE — Progress Notes (Signed)
07/16/2021     CHIEF COMPLAINT Patient presents for  Chief Complaint  Patient presents with   Retina Follow Up      HISTORY OF PRESENT ILLNESS: [name] [name] is a 88 y.o. male who presents to the clinic today for:   HPI     Retina Follow Up           Laterality: left eye   Onset: 7 weeks ago   Severity: mild   Duration: 7 weeks         Comments   7 weeks fu Dilate OS, Avastin OCT. Patient states vision is stable and unchanged since last visit. Denies any new floaters or FOL. Pt uses dorz-tim OS BID.      Last edited by Nelva Nay on 07/16/2021  3:05 PM.      Referring physician: Garlan Fillers, MD [address] [place],  [place] [address]  HISTORICAL INFORMATION:   Selected notes from the MEDICAL RECORD NUMBER    Lab Results  Component Value Date   HGBA1C 6.0 (H) 07/03/2018     CURRENT MEDICATIONS: Current Outpatient Medications (Ophthalmic Drugs)  Medication Sig   carboxymethylcellulose (REFRESH PLUS) 0.5 % SOLN Place 1 drop into both eyes daily as needed (dry eyes).   dorzolamide-timolol (COSOPT) 22.3-6.8 MG/ML ophthalmic solution Place 1 drop into the left eye 2 (two) times daily.   No current facility-administered medications for this visit. (Ophthalmic Drugs)   Current Outpatient Medications (Other)  Medication Sig   acetaminophen (TYLENOL) 325 MG tablet Take 2 tablets (650 mg total) by mouth every 6 (six) hours as needed for mild pain.   allopurinol (ZYLOPRIM) 300 MG tablet Take 300 mg by mouth daily.    amLODipine (NORVASC) 10 MG tablet TAKE 1 TABLET BY MOUTH DAILY.   aspirin 81 MG tablet Take 81 mg by mouth daily.   atorvastatin (LIPITOR) 80 MG tablet TAKE 1 TABLET BY MOUTH DAILY.   hydrocortisone cream 1 % Apply 1 application topically daily as needed for itching.   insulin detemir (LEVEMIR) 100 UNIT/ML injection Inject 34 Units into the skin every morning.    Multiple Vitamin (MULTIVITAMIN WITH MINERALS) TABS tablet Take 2 tablets  by mouth daily.   nitroGLYCERIN (NITROSTAT) 0.4 MG SL tablet PLACE 1 TABLET UNDER THE TONGUE AS NEEDED FOR CHEST PAIN   No current facility-administered medications for this visit. (Other)      REVIEW OF SYSTEMS:    ALLERGIES Allergies  Allergen Reactions   Indocin [Indomethacin] Other (See Comments)    Antiinflammatory medication for gout ? Indocin > black out per patient ? SYNCOPE ?    PAST MEDICAL HISTORY Past Medical History:  Diagnosis Date   Arthritis of back    BPH (benign prostatic hyperplasia)    CKD (chronic kidney disease) stage 3, GFR 30-59 ml/min (HCC)    Colon polyps    Diabetes mellitus, type 2 (HCC)    Diabetic nephropathy (HCC)    Diverticulosis    ED (erectile dysfunction)    Gallstones    GERD (gastroesophageal reflux disease)    Glaucoma    Hiatal hernia    History of gout    History of hiatal hernia    Hx of urinary tract infection    Hypercholesterolemia    Hypertension    Left groin hematoma 07/04/2018   Osteoarthritis    left knee   Pneumonia    PVD (peripheral vascular disease) (HCC)    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   26 mm Edwards Sapien 3 transcatheter heart valve placed via percutaneous right transfemoral approach    Severe aortic stenosis    Sleep apnea    Sleep apnea, obstructive    Past Surgical History:  Procedure Laterality Date   ABDOMINAL AORTIC ANEURYSM REPAIR  1995   APPENDECTOMY     CATARACT EXTRACTION Bilateral 2003   FEMORAL-POPLITEAL BYPASS GRAFT     left   INTRAOPERATIVE TRANSTHORACIC ECHOCARDIOGRAM  07/04/2018   Procedure: INTRAOPERATIVE TRANSTHORACIC ECHOCARDIOGRAM;  Surgeon: Sherren Mocha, MD;  Location: [facility] OR;  Service: Open Heart Surgery;;   LUMBAR SPINE SURGERY  03/2001   REPLACEMENT TOTAL KNEE Bilateral 2004   RIGHT/LEFT HEART CATH AND CORONARY ANGIOGRAPHY N/A 06/13/2018   Procedure: RIGHT/LEFT HEART CATH AND CORONARY ANGIOGRAPHY;  Surgeon: Martinique, Peter M, MD;  Location: [facility];  Service: Cardiovascular;  Laterality: N/A;   TEE WITHOUT CARDIOVERSION  07/04/2018   Procedure: TRANSESOPHAGEAL ECHOCARDIOGRAM (TEE);  Surgeon: Sherren Mocha, MD;  Location: [facility];  Service: Open Heart Surgery;;   TOTAL KNEE ARTHROPLASTY     bilateral   TRANSCATHETER AORTIC VALVE REPLACEMENT, TRANSFEMORAL  07/04/2018   TRANSCATHETER AORTIC VALVE REPLACEMENT, TRANSFEMORAL N/A 07/04/2018   Procedure: TRANSCATHETER AORTIC VALVE REPLACEMENT, TRANSFEMORAL;  Surgeon: Sherren Mocha, MD;  Location: [facility];  Service: Open Heart Surgery;  Laterality: N/A;   VASECTOMY      FAMILY HISTORY Family History  Problem Relation Age of Onset   Heart attack Mother    Alzheimer's disease Father    Pancreatic cancer Sister     SOCIAL HISTORY Social History   Tobacco Use   Smoking status: Former    Packs/day: 2.00    Years: 30.00    Pack years: 60.00    Types: Cigarettes    Quit date: 11/04/1984    Years since quitting: 36.7   Smokeless tobacco: Never  Vaping Use   Vaping Use: Never used  Substance Use Topics   Alcohol use: Yes    Alcohol/week: 0.0 standard drinks    Comment: rarely   Drug use: No         OPHTHALMIC EXAM:  Base Eye Exam     Visual Acuity (ETDRS)       Right Left   Dist cc 20/30 20/40 -2    Correction: Glasses         Tonometry (Tonopen, 3:09 PM)       Right Left   Pressure 14 13         Pupils       Dark Light Shape APD   Right 4 3  None   Left 4 3 Irregular None         Extraocular Movement       Right Left    Full Full         Neuro/Psych     Oriented x3: Yes   Mood/Affect: Normal         Dilation     Both eyes: 1.0% Mydriacyl, 2.5% Phenylephrine @ 3:09 PM           Slit Lamp and Fundus Exam     External Exam       Right Left   External Normal Normal         Slit Lamp Exam       Right Left   Lids/Lashes Normal Normal   Conjunctiva/Sclera White and quiet White and quiet   Cornea Clear Clear    Anterior Chamber Deep  and quiet Deep and quiet   Iris Round and reactive Round and reactive   Lens Centered posterior chamber intraocular lens Centered posterior chamber intraocular lens   Anterior Vitreous Normal Normal         Fundus Exam       Right Left   Posterior Vitreous  Posterior vitreous detachment   Disc  Collaterals on the nerve   C/D Ratio  0.7   Macula  No Macular thickening, Microaneurysms, Intraretinal hemorrhage   Vessels  Hemi-CRVO inferiorly, less distended, not engorged   Periphery  No peripheral ischemic changes, no peripheral hemorrhages seen            IMAGING AND PROCEDURES  Imaging and Procedures for 07/16/21  Intravitreal Injection, Pharmacologic Agent - OS - Left Eye       Time Out 07/16/2021. 3:54 PM. Confirmed correct patient, procedure, site, and patient consented.   Anesthesia Topical anesthesia was used. Anesthetic medications included Lidocaine 4%.   Procedure Preparation included Ofloxacin , 5% betadine to ocular surface, 10% betadine to eyelids. A 30 gauge needle was used.   Injection: 2.5 mg bevacizumab 2.5 MG/0.1ML   Route: Intravitreal, Site: Left Eye   NDC: [phone], Lot: [id number]   Post-op Post injection exam found visual acuity of at least counting fingers. The patient tolerated the procedure well. There were no complications. The patient received written and verbal post procedure care education. Post injection medications were not given.      OCT, Retina - OU - Both Eyes       Right Eye Quality was good. Scan locations included subfoveal. Central Foveal Thickness: 274. Progression has been stable. Findings include normal foveal contour.   Left Eye Quality was good. Scan locations included subfoveal. Central Foveal Thickness: 277. Progression has improved. Findings include abnormal foveal contour, cystoid macular edema.   Notes Much improved CME from massive CRV O as compared to onset August 2021, but after  recurrence developed November 2022, now much improved CME from 7 weeks previous postinjection Avastin for CRV O OS we will repeat injection intravitreal Avastin today and maintain 7 to 8-week follow-up to minimize treatment burden             ASSESSMENT/PLAN:  Central retinal vein occlusion, left eye, with macular edema Vastly improved OS compared to November 2022, currently at 7-week follow-up.  We will maintain 7-week follow-up promote further resolution and maintenance of good acuity OS     ICD-10-CM   1. Central retinal vein occlusion, left eye, with macular edema  H34.8120 Intravitreal Injection, Pharmacologic Agent - OS - Left Eye    OCT, Retina - OU - Both Eyes    bevacizumab (AVASTIN) SOSY 2.5 mg      1.  OS vastly improved as compared to November, improved acuity as well improved OCT findings.  Pete injection Avastin today and maintain 7-week follow-up OS  2.  3.  Ophthalmic Meds Ordered this visit:  Meds ordered this encounter  Medications   bevacizumab (AVASTIN) SOSY 2.5 mg       Return in about 7 weeks (around 09/03/2021) for dilate, OS, AVASTIN OCT.  There are no Patient Instructions on file for this visit.   Explained the diagnoses, plan, and follow up with the patient and they expressed understanding.  Patient expressed understanding of the importance of proper follow up care.   Clent Demark Tequisha Maahs M.D. Diseases & Surgery of the Retina and Vitreous [facility] 07/16/21  Abbreviations: M myopia (nearsighted); A astigmatism; H hyperopia (farsighted); P presbyopia; Mrx spectacle prescription;  CTL contact lenses; OD right eye; OS left eye; OU both eyes  XT exotropia; ET esotropia; PEK punctate epithelial keratitis; PEE punctate epithelial erosions; DES dry eye syndrome; MGD meibomian gland dysfunction; ATs artificial tears; PFAT's preservative free artificial tears; [place] nuclear sclerotic cataract; PSC posterior subcapsular cataract; ERM  epi-retinal membrane; PVD posterior vitreous detachment; RD retinal detachment; DM diabetes mellitus; DR diabetic retinopathy; NPDR non-proliferative diabetic retinopathy; PDR proliferative diabetic retinopathy; CSME clinically significant macular edema; DME diabetic macular edema; dbh dot blot hemorrhages; CWS cotton wool spot; POAG primary open angle glaucoma; C/D cup-to-disc ratio; HVF humphrey visual field; GVF goldmann visual field; OCT optical coherence tomography; IOP intraocular pressure; BRVO Branch retinal vein occlusion; CRVO central retinal vein occlusion; CRAO central retinal artery occlusion; BRAO branch retinal artery occlusion; RT retinal tear; SB scleral buckle; PPV pars plana vitrectomy; VH Vitreous hemorrhage; PRP panretinal laser photocoagulation; IVK intravitreal kenalog; VMT vitreomacular traction; MH Macular hole;  NVD neovascularization of the disc; NVE neovascularization elsewhere; AREDS age related eye disease study; ARMD age related macular degeneration; POAG primary open angle glaucoma; EBMD epithelial/anterior basement membrane dystrophy; ACIOL anterior chamber intraocular lens; IOL intraocular lens; PCIOL posterior chamber intraocular lens; Phaco/IOL phacoemulsification with intraocular lens placement; [place] photorefractive keratectomy; LASIK laser assisted in situ keratomileusis; HTN hypertension; DM diabetes mellitus; COPD chronic obstructive pulmonary disease

## 2021-07-29 ENCOUNTER — Encounter: Payer: Self-pay | Admitting: Cardiology

## 2021-07-29 NOTE — Telephone Encounter (Signed)
Encounter not needed

## 2021-09-03 ENCOUNTER — Encounter (INDEPENDENT_AMBULATORY_CARE_PROVIDER_SITE_OTHER): Payer: Self-pay | Admitting: Ophthalmology

## 2021-09-03 ENCOUNTER — Ambulatory Visit (INDEPENDENT_AMBULATORY_CARE_PROVIDER_SITE_OTHER): Payer: Medicare Other | Admitting: Ophthalmology

## 2021-09-03 ENCOUNTER — Other Ambulatory Visit: Payer: Self-pay

## 2021-09-03 DIAGNOSIS — H34812 Central retinal vein occlusion, left eye, with macular edema: Secondary | ICD-10-CM

## 2021-09-03 MED ORDER — BEVACIZUMAB 2.5 MG/0.1ML IZ SOSY
2.5000 mg | PREFILLED_SYRINGE | INTRAVITREAL | Status: AC | PRN
  Administered 2021-09-03: 2.5 mg via INTRAVITREAL

## 2021-09-03 NOTE — Progress Notes (Signed)
? ? ?09/03/2021 ? ?  ? ?CHIEF COMPLAINT ?Patient presents for  ?Chief Complaint  ?Patient presents with  ? Central Retinal Vein Occlusion  ? ? ? ? ?HISTORY OF PRESENT ILLNESS: ?[name] [name] is a 88 y.o. male who presents to the clinic today for:  ? ?HPI   ?7 weeks Dilate OS, Avastin OS, OCT. ?Patient states vision is stable and unchanged since last visit. Denies any new floaters or FOL. ?Pt states "my left eye is blurred but no changes." ?Patient is using dorz-timolol BID OS. ?Last edited by Laurin Coder on 09/03/2021  3:09 PM.  ?  ? ? ?Referring physician: ?Donnajean Lopes, MD ?[address] ?[place],  [place] [address] ? ?HISTORICAL INFORMATION:  ? ?Selected notes from the [facility] ?  ? ?Lab Results  ?Component Value Date  ? HGBA1C 6.0 (H) 07/03/2018  ?  ? ?CURRENT MEDICATIONS: ?Current Outpatient Medications (Ophthalmic Drugs)  ?Medication Sig  ? carboxymethylcellulose (REFRESH PLUS) 0.5 % SOLN Place 1 drop into both eyes daily as needed (dry eyes).  ? dorzolamide-timolol (COSOPT) 22.3-6.8 MG/ML ophthalmic solution Place 1 drop into the left eye 2 (two) times daily.  ? ?No current facility-administered medications for this visit. (Ophthalmic Drugs)  ? ?Current Outpatient Medications (Other)  ?Medication Sig  ? acetaminophen (TYLENOL) 325 MG tablet Take 2 tablets (650 mg total) by mouth every 6 (six) hours as needed for mild pain.  ? allopurinol (ZYLOPRIM) 300 MG tablet Take 300 mg by mouth daily.   ? amLODipine (NORVASC) 10 MG tablet TAKE 1 TABLET BY MOUTH DAILY.  ? aspirin 81 MG tablet Take 81 mg by mouth daily.  ? atorvastatin (LIPITOR) 80 MG tablet TAKE 1 TABLET BY MOUTH DAILY.  ? hydrocortisone cream 1 % Apply 1 application topically daily as needed for itching.  ? insulin detemir (LEVEMIR) 100 UNIT/ML injection Inject 34 Units into the skin every morning.   ? Multiple Vitamin (MULTIVITAMIN WITH MINERALS) TABS tablet Take 2 tablets by mouth daily.  ? nitroGLYCERIN (NITROSTAT) 0.4 MG SL tablet PLACE  1 TABLET UNDER THE TONGUE AS NEEDED FOR CHEST PAIN  ? ?No current facility-administered medications for this visit. (Other)  ? ? ? ? ?REVIEW OF SYSTEMS: ? ? ? ?ALLERGIES ?Allergies  ?Allergen Reactions  ? Indocin [Indomethacin] Other (See Comments)  ?  Antiinflammatory medication for gout ? Indocin > black out per patient ?? SYNCOPE ?  ? ? ?PAST MEDICAL HISTORY ?Past Medical History:  ?Diagnosis Date  ? Arthritis of back   ? BPH (benign prostatic hyperplasia)   ? CKD (chronic kidney disease) stage 3, GFR 30-59 ml/min (HCC)   ? Colon polyps   ? Diabetes mellitus, type 2 ([place])   ? Diabetic nephropathy ([place])   ? Diverticulosis   ? ED (erectile dysfunction)   ? Gallstones   ? GERD (gastroesophageal reflux disease)   ? Glaucoma   ? Hiatal hernia   ? History of gout   ? History of hiatal hernia   ? Hx of urinary tract infection   ? Hypercholesterolemia   ? Hypertension   ? Left groin hematoma 07/04/2018  ? Osteoarthritis   ? left knee  ? Pneumonia   ? PVD (peripheral vascular disease) ([place])   ? 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  ? 26 mm Edwards Sapien 3 transcatheter heart valve placed via percutaneous right transfemoral approach   ? Severe aortic stenosis   ? Sleep apnea   ? Sleep apnea, obstructive   ? ?  Past Surgical History:  ?Procedure Laterality Date  ? ABDOMINAL AORTIC ANEURYSM REPAIR  1995  ? APPENDECTOMY    ? CATARACT EXTRACTION Bilateral 2003  ? FEMORAL-POPLITEAL BYPASS GRAFT    ? left  ? INTRAOPERATIVE TRANSTHORACIC ECHOCARDIOGRAM  07/04/2018  ? Procedure: INTRAOPERATIVE TRANSTHORACIC ECHOCARDIOGRAM;  Surgeon: Sherren Mocha, MD;  Location: [facility];  Service: Open Heart Surgery;;  ? [place]  03/2001  ? REPLACEMENT TOTAL KNEE Bilateral 2004  ? RIGHT/LEFT HEART CATH AND CORONARY ANGIOGRAPHY N/A 06/13/2018  ? Procedure: RIGHT/LEFT HEART CATH AND CORONARY ANGIOGRAPHY;  Surgeon: Martinique, Peter M, MD;  Location: [facility];  Service: Cardiovascular;  Laterality: N/A;  ? TEE  WITHOUT CARDIOVERSION  07/04/2018  ? Procedure: TRANSESOPHAGEAL ECHOCARDIOGRAM (TEE);  Surgeon: Sherren Mocha, MD;  Location: [facility];  Service: Open Heart Surgery;;  ? TOTAL KNEE ARTHROPLASTY    ? bilateral  ? TRANSCATHETER AORTIC VALVE REPLACEMENT, TRANSFEMORAL  07/04/2018  ? TRANSCATHETER AORTIC VALVE REPLACEMENT, TRANSFEMORAL N/A 07/04/2018  ? Procedure: TRANSCATHETER AORTIC VALVE REPLACEMENT, TRANSFEMORAL;  Surgeon: Sherren Mocha, MD;  Location: [facility];  Service: Open Heart Surgery;  Laterality: N/A;  ? VASECTOMY    ? ? ?FAMILY HISTORY ?Family History  ?Problem Relation Age of Onset  ? Heart attack Mother   ? Alzheimer's disease Father   ? Pancreatic cancer Sister   ? ? ?SOCIAL HISTORY ?Social History  ? ?Tobacco Use  ? Smoking status: Former  ?  Packs/day: 2.00  ?  Years: 30.00  ?  Pack years: 60.00  ?  Types: Cigarettes  ?  Quit date: 11/04/1984  ?  Years since quitting: 36.8  ? Smokeless tobacco: Never  ?Vaping Use  ? Vaping Use: Never used  ?Substance Use Topics  ? Alcohol use: Yes  ?  Alcohol/week: 0.0 standard drinks  ?  Comment: rarely  ? Drug use: No  ? ?  ? ?  ? ?OPHTHALMIC EXAM: ? ?Base Eye Exam   ? ? Visual Acuity (ETDRS)   ? ?   Right Left  ? Dist [place] 20/20 20/50 -2  ? Dist ph   20/30 -2  ? ?  ?  ? ? Tonometry (Tonopen, 3:13 PM)   ? ?   Right Left  ? Pressure 14 10  ? ?  ?  ? ? Pupils   ? ?   Pupils Dark Light Shape APD  ? Right PERRL 4 3  None  ? Left PERRL 4 3 Irregular None  ? ?  ?  ? ? Visual Fields   ? ?   Left Right  ?  Full Full  ? ?  ?  ? ? Extraocular Movement   ? ?   Right Left  ?  Full Full  ? ?  ?  ? ? Neuro/Psych   ? ? Oriented x3: Yes  ? Mood/Affect: Normal  ? ?  ?  ? ? Dilation   ? ? Left eye: 1.0% Mydriacyl, 2.5% Phenylephrine @ 3:13 PM  ? ?  ?  ? ?  ? ?Slit Lamp and Fundus Exam   ? ? External Exam   ? ?   Right Left  ? External Normal Normal  ? ?  ?  ? ? Slit Lamp Exam   ? ?   Right Left  ? Lids/Lashes Normal Normal  ? Conjunctiva/Sclera White and quiet White and quiet  ? Cornea  Clear Clear  ? Anterior Chamber Deep and quiet Deep and quiet  ? Iris  Round and reactive Round and reactive  ? Lens Centered posterior chamber intraocular lens Centered posterior chamber intraocular lens  ? Anterior Vitreous Normal Normal  ? ?  ?  ? ? Fundus Exam   ? ?   Right Left  ? Posterior Vitreous  Posterior vitreous detachment  ? Disc  Collaterals on the nerve  ? C/D Ratio  0.7  ? Macula  No Macular thickening, Microaneurysms, Intraretinal hemorrhage  ? Vessels  Hemi-CRVO inferiorly, less distended, not engorged  ? Periphery  No peripheral ischemic changes, no peripheral hemorrhages seen  ? ?  ?  ? ?  ? ? ?IMAGING AND PROCEDURES  ?Imaging and Procedures for 09/03/21 ? ?Intravitreal Injection, Pharmacologic Agent - OS - Left Eye   ? ?   ?Time Out ?09/03/2021. 3:52 PM. Confirmed correct patient, procedure, site, and patient consented.  ? ?Anesthesia ?Topical anesthesia was used. Anesthetic medications included Lidocaine 4%.  ? ?Procedure ?Preparation included Ofloxacin , 5% betadine to ocular surface, 10% betadine to eyelids. A 30 gauge needle was used.  ? ?Injection: ?2.5 mg bevacizumab 2.5 MG/0.1ML ?  Route: Intravitreal, Site: Left Eye ?  [place]: [id number], [id number]  ? ?Post-op ?Post injection exam found visual acuity of at least counting fingers. The patient tolerated the procedure well. There were no complications. The patient received written and verbal post procedure care education. Post injection medications were not given.  ? ?  ? ?OCT, Retina - OU - Both Eyes   ? ?   ?Right Eye ?Quality was good. Scan locations included subfoveal. Central Foveal Thickness: 273. Progression has been stable. Findings include normal foveal contour.  ? ?Left Eye ?Quality was good. Scan locations included subfoveal. Central Foveal Thickness: 278. Progression has improved. Findings include abnormal foveal contour.  ? ?Notes ?Much improved CME from massive CRV O as compared to onset August 2021, but after recurrence  developed November 2022, now much improved CME from 7 weeks previous postinjection Avastin for CRV O OS we will repeat injection intravitreal Avastin today and maintain 9-week follow-up to minimize treatment b

## 2021-09-03 NOTE — Assessment & Plan Note (Signed)
OS, improved CME maintained at 7-week follow-up interval today.  Recommend Avastin repeated today and extend interval to 8 weeks.  Due to transportation issues the been asked to extend this to 9 weeks which is a reasonable interval ?

## 2021-10-20 ENCOUNTER — Encounter (INDEPENDENT_AMBULATORY_CARE_PROVIDER_SITE_OTHER): Payer: Medicare Other | Admitting: Ophthalmology

## 2021-10-22 ENCOUNTER — Encounter: Payer: Self-pay | Admitting: Internal Medicine

## 2021-10-23 ENCOUNTER — Encounter: Payer: Self-pay | Admitting: Internal Medicine

## 2021-11-02 ENCOUNTER — Encounter: Payer: Self-pay | Admitting: Internal Medicine

## 2021-11-03 ENCOUNTER — Encounter (INDEPENDENT_AMBULATORY_CARE_PROVIDER_SITE_OTHER): Payer: Medicare Other | Admitting: Ophthalmology

## 2021-11-05 ENCOUNTER — Encounter (INDEPENDENT_AMBULATORY_CARE_PROVIDER_SITE_OTHER): Payer: Self-pay | Admitting: Ophthalmology

## 2021-11-05 ENCOUNTER — Ambulatory Visit (INDEPENDENT_AMBULATORY_CARE_PROVIDER_SITE_OTHER): Payer: Medicare Other | Admitting: Ophthalmology

## 2021-11-05 DIAGNOSIS — H34812 Central retinal vein occlusion, left eye, with macular edema: Secondary | ICD-10-CM | POA: Diagnosis not present

## 2021-11-05 MED ORDER — BEVACIZUMAB 2.5 MG/0.1ML IZ SOSY
2.5000 mg | PREFILLED_SYRINGE | INTRAVITREAL | Status: AC | PRN
  Administered 2021-11-05: 2.5 mg via INTRAVITREAL

## 2021-11-05 NOTE — Assessment & Plan Note (Signed)
OS vastly improved since onset of disease and therapy August 2021.  Now 9-week follow-up interval and maintenance of resolved CME yet still with residual visual impairment to the mid eye chart.  Repeat injection today and extend interval examination next to 10 weeks

## 2021-11-05 NOTE — Progress Notes (Signed)
11/05/2021     CHIEF COMPLAINT Patient presents for  Chief Complaint  Patient presents with   Cystoid Macular Edema      HISTORY OF PRESENT ILLNESS: [name] [name] is a 88 y.o. male who presents to the clinic today for:   HPI   9 weeks dilate OS, Avastin OS, OCT. Patient states vision is stable and unchanged since last visit. Denies any new floaters or FOL. Dorz-tim OS BID (sometimes). Last edited by Nelva Nay on 11/05/2021  1:31 PM.      Referring physician: Ernesto Rutherford, MD [address] [place],  [place] [address]  HISTORICAL INFORMATION:   Selected notes from the MEDICAL RECORD NUMBER    Lab Results  Component Value Date   HGBA1C 6.0 (H) 07/03/2018     CURRENT MEDICATIONS: Current Outpatient Medications (Ophthalmic Drugs)  Medication Sig   carboxymethylcellulose (REFRESH PLUS) 0.5 % SOLN Place 1 drop into both eyes daily as needed (dry eyes).   dorzolamide-timolol (COSOPT) 22.3-6.8 MG/ML ophthalmic solution Place 1 drop into the left eye 2 (two) times daily.   No current facility-administered medications for this visit. (Ophthalmic Drugs)   Current Outpatient Medications (Other)  Medication Sig   acetaminophen (TYLENOL) 325 MG tablet Take 2 tablets (650 mg total) by mouth every 6 (six) hours as needed for mild pain.   allopurinol (ZYLOPRIM) 300 MG tablet Take 300 mg by mouth daily.    amLODipine (NORVASC) 10 MG tablet TAKE 1 TABLET BY MOUTH DAILY.   aspirin 81 MG tablet Take 81 mg by mouth daily.   atorvastatin (LIPITOR) 80 MG tablet TAKE 1 TABLET BY MOUTH DAILY.   hydrocortisone cream 1 % Apply 1 application topically daily as needed for itching.   insulin detemir (LEVEMIR) 100 UNIT/ML injection Inject 34 Units into the skin every morning.    Multiple Vitamin (MULTIVITAMIN WITH MINERALS) TABS tablet Take 2 tablets by mouth daily.   nitroGLYCERIN (NITROSTAT) 0.4 MG SL tablet PLACE 1 TABLET UNDER THE TONGUE AS NEEDED FOR CHEST PAIN   No current  facility-administered medications for this visit. (Other)      REVIEW OF SYSTEMS: ROS   Negative for: Constitutional, Gastrointestinal, Neurological, Skin, Genitourinary, Musculoskeletal, HENT, Endocrine, Cardiovascular, Eyes, Respiratory, Psychiatric, Allergic/Imm, Heme/Lymph Last edited by Edmon Crape, MD on 11/05/2021  2:05 PM.       ALLERGIES Allergies  Allergen Reactions   Indocin [Indomethacin] Other (See Comments)    Antiinflammatory medication for gout ? Indocin > black out per patient ? SYNCOPE ?    PAST MEDICAL HISTORY Past Medical History:  Diagnosis Date   Arthritis of back    BPH (benign prostatic hyperplasia)    CKD (chronic kidney disease) stage 3, GFR 30-59 ml/min (HCC)    Colon polyps    Diabetes mellitus, type 2 (HCC)    Diabetic nephropathy (HCC)    Diverticulosis    ED (erectile dysfunction)    Gallstones    GERD (gastroesophageal reflux disease)    Glaucoma    Hiatal hernia    History of gout    History of hiatal hernia    Hx of urinary tract infection    Hypercholesterolemia    Hypertension    Left groin hematoma 07/04/2018   Osteoarthritis    left knee   Pneumonia    PVD (peripheral vascular disease) (HCC)    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   26 mm Edwards Sapien 3 transcatheter heart valve placed via percutaneous  right transfemoral approach    Severe aortic stenosis    Sleep apnea    Sleep apnea, obstructive    Past Surgical History:  Procedure Laterality Date   ABDOMINAL AORTIC ANEURYSM REPAIR  1995   APPENDECTOMY     CATARACT EXTRACTION Bilateral 2003   FEMORAL-POPLITEAL BYPASS GRAFT     left   INTRAOPERATIVE TRANSTHORACIC ECHOCARDIOGRAM  07/04/2018   Procedure: INTRAOPERATIVE TRANSTHORACIC ECHOCARDIOGRAM;  Surgeon: Sherren Mocha, MD;  Location: [facility];  Service: Open Heart Surgery;;   LUMBAR SPINE SURGERY  03/2001   REPLACEMENT TOTAL KNEE Bilateral 2004   RIGHT/LEFT HEART CATH AND CORONARY  ANGIOGRAPHY N/A 06/13/2018   Procedure: RIGHT/LEFT HEART CATH AND CORONARY ANGIOGRAPHY;  Surgeon: Martinique, Peter M, MD;  Location: [facility];  Service: Cardiovascular;  Laterality: N/A;   TEE WITHOUT CARDIOVERSION  07/04/2018   Procedure: TRANSESOPHAGEAL ECHOCARDIOGRAM (TEE);  Surgeon: Sherren Mocha, MD;  Location: [facility];  Service: Open Heart Surgery;;   TOTAL KNEE ARTHROPLASTY     bilateral   TRANSCATHETER AORTIC VALVE REPLACEMENT, TRANSFEMORAL  07/04/2018   TRANSCATHETER AORTIC VALVE REPLACEMENT, TRANSFEMORAL N/A 07/04/2018   Procedure: TRANSCATHETER AORTIC VALVE REPLACEMENT, TRANSFEMORAL;  Surgeon: Sherren Mocha, MD;  Location: [facility];  Service: Open Heart Surgery;  Laterality: N/A;   VASECTOMY      FAMILY HISTORY Family History  Problem Relation Age of Onset   Heart attack Mother    Alzheimer's disease Father    Pancreatic cancer Sister     SOCIAL HISTORY Social History   Tobacco Use   Smoking status: Former    Packs/day: 2.00    Years: 30.00    Pack years: 60.00    Types: Cigarettes    Quit date: 11/04/1984    Years since quitting: 37.0   Smokeless tobacco: Never  Vaping Use   Vaping Use: Never used  Substance Use Topics   Alcohol use: Yes    Alcohol/week: 0.0 standard drinks    Comment: rarely   Drug use: No         OPHTHALMIC EXAM:  Base Eye Exam     Visual Acuity (ETDRS)       Right Left   Dist [place] 20/20 20/60 -2   Dist ph [place]  20/50 -2         Tonometry (Tonopen, 1:33 PM)       Right Left   Pressure 14 14         Pupils       Pupils Dark Light APD   Right PERRL 4 3 None   Left PERRL 4 3 None         Extraocular Movement       Right Left    Full Full         Neuro/Psych     Oriented x3: Yes   Mood/Affect: Normal         Dilation     Left eye: 1.0% Mydriacyl, 2.5% Phenylephrine @ 1:33 PM           Slit Lamp and Fundus Exam     External Exam       Right Left   External Normal Normal         Slit  Lamp Exam       Right Left   Lids/Lashes Normal Normal   Conjunctiva/Sclera White and quiet White and quiet   Cornea Clear Clear   Anterior Chamber Deep and quiet Deep and quiet   Iris Round and reactive  Round and reactive   Lens Centered posterior chamber intraocular lens Centered posterior chamber intraocular lens   Anterior Vitreous Normal Normal         Fundus Exam       Right Left   Posterior Vitreous  Posterior vitreous detachment   Disc  Collaterals on the nerve   C/D Ratio  0.7   Macula  No Macular thickening, Microaneurysms, Intraretinal hemorrhage   Vessels  Hemi-CRVO inferiorly, less distended, not engorged   Periphery  No peripheral ischemic changes, no peripheral hemorrhages seen            IMAGING AND PROCEDURES  Imaging and Procedures for 11/05/21  OCT, Retina - OU - Both Eyes       Right Eye Quality was good. Scan locations included subfoveal. Central Foveal Thickness: 275. Progression has been stable. Findings include normal foveal contour.   Left Eye Quality was good. Scan locations included subfoveal. Central Foveal Thickness: 276. Progression has improved. Findings include abnormal foveal contour.   Notes Much improved CME from massive CRV O as compared to onset August 2021, but after recurrence developed November 2022, now much improved CME from 7 weeks previous postinjection Avastin for CRV O OS we will repeat injection intravitreal Avastin today and extend interval to 10 weeks follow-up to minimize treatment burden     Intravitreal Injection, Pharmacologic Agent - OS - Left Eye       Time Out 11/05/2021. 2:11 PM. Confirmed correct patient, procedure, site, and patient consented.   Anesthesia Topical anesthesia was used. Anesthetic medications included Lidocaine 4%.   Procedure Preparation included Ofloxacin , 5% betadine to ocular surface, 10% betadine to eyelids. A 30 gauge needle was used.   Injection: 2.5 mg bevacizumab 2.5  MG/0.1ML   Route: Intravitreal, Site: Left Eye   NDC: [id number], Lot: [id number], Expiration date: 01/10/2022   Post-op Post injection exam found visual acuity of at least counting fingers. The patient tolerated the procedure well. There were no complications. The patient received written and verbal post procedure care education. Post injection medications were not given.              ASSESSMENT/PLAN:  Central retinal vein occlusion, left eye, with macular edema OS vastly improved since onset of disease and therapy August 2021.  Now 9-week follow-up interval and maintenance of resolved CME yet still with residual visual impairment to the mid eye chart.  Repeat injection today and extend interval examination next to 10 weeks     ICD-10-CM   1. Central retinal vein occlusion, left eye, with macular edema  H34.8120 OCT, Retina - OU - Both Eyes    Intravitreal Injection, Pharmacologic Agent - OS - Left Eye    bevacizumab (AVASTIN) SOSY 2.5 mg      1.  OS vastly improved overall and improved acuity as well from CME secondary to ischemic CRV O.  Residual ischemic leaves the current damage acuity.  Repeat injection today at 9-week interval of Avastin and follow-up again next in 9 to 10 weeks per patient schedule possibilities  2.  3.  Ophthalmic Meds Ordered this visit:  Meds ordered this encounter  Medications   bevacizumab (AVASTIN) SOSY 2.5 mg       Return in about 9 weeks (around 01/07/2022) for DILATE OU, AVASTIN OCT, OS, OPTOS FFA L/R, COLOR FP.  There are no Patient Instructions on file for this visit.   Explained the diagnoses, plan, and follow up with the patient and they expressed understanding.  Patient expressed understanding of the importance of proper follow up care.   Clent Demark Lollie Gunner M.D. Diseases & Surgery of the Retina and Vitreous [facility] 11/05/21     Abbreviations: M myopia (nearsighted); A astigmatism; H hyperopia (farsighted);  P presbyopia; Mrx spectacle prescription;  CTL contact lenses; OD right eye; OS left eye; OU both eyes  XT exotropia; ET esotropia; PEK punctate epithelial keratitis; PEE punctate epithelial erosions; DES dry eye syndrome; MGD meibomian gland dysfunction; ATs artificial tears; PFAT's preservative free artificial tears; [place] nuclear sclerotic cataract; PSC posterior subcapsular cataract; ERM epi-retinal membrane; PVD posterior vitreous detachment; RD retinal detachment; DM diabetes mellitus; DR diabetic retinopathy; NPDR non-proliferative diabetic retinopathy; PDR proliferative diabetic retinopathy; CSME clinically significant macular edema; DME diabetic macular edema; dbh dot blot hemorrhages; CWS cotton wool spot; POAG primary open angle glaucoma; C/D cup-to-disc ratio; HVF humphrey visual field; GVF goldmann visual field; OCT optical coherence tomography; IOP intraocular pressure; BRVO Branch retinal vein occlusion; CRVO central retinal vein occlusion; CRAO central retinal artery occlusion; BRAO branch retinal artery occlusion; RT retinal tear; SB scleral buckle; PPV pars plana vitrectomy; VH Vitreous hemorrhage; PRP panretinal laser photocoagulation; IVK intravitreal kenalog; VMT vitreomacular traction; MH Macular hole;  NVD neovascularization of the disc; NVE neovascularization elsewhere; AREDS age related eye disease study; ARMD age related macular degeneration; POAG primary open angle glaucoma; EBMD epithelial/anterior basement membrane dystrophy; ACIOL anterior chamber intraocular lens; IOL intraocular lens; PCIOL posterior chamber intraocular lens; Phaco/IOL phacoemulsification with intraocular lens placement; [place] photorefractive keratectomy; LASIK laser assisted in situ keratomileusis; HTN hypertension; DM diabetes mellitus; COPD chronic obstructive pulmonary disease

## 2022-01-07 ENCOUNTER — Ambulatory Visit (INDEPENDENT_AMBULATORY_CARE_PROVIDER_SITE_OTHER): Payer: Medicare Other | Admitting: Ophthalmology

## 2022-01-07 ENCOUNTER — Encounter (INDEPENDENT_AMBULATORY_CARE_PROVIDER_SITE_OTHER): Payer: Self-pay | Admitting: Ophthalmology

## 2022-01-07 ENCOUNTER — Encounter (INDEPENDENT_AMBULATORY_CARE_PROVIDER_SITE_OTHER): Payer: Medicare Other | Admitting: Ophthalmology

## 2022-01-07 DIAGNOSIS — H34812 Central retinal vein occlusion, left eye, with macular edema: Secondary | ICD-10-CM

## 2022-01-07 MED ORDER — BEVACIZUMAB CHEMO INJECTION 1.25MG/0.05ML SYRINGE FOR KALEIDOSCOPE
1.2500 mg | INTRAVITREAL | Status: AC | PRN
  Administered 2022-01-07: 1.25 mg via INTRAVITREAL

## 2022-01-07 NOTE — Assessment & Plan Note (Signed)
Overall much improved still active today at 9-week interval.  We will repeat injection today to protect macular functioning and to resolve CME.  And shorten interval follow-up next to 7 weeks

## 2022-01-07 NOTE — Progress Notes (Addendum)
01/07/2022     CHIEF COMPLAINT Patient presents for  Chief Complaint  Patient presents with   Central Retinal Vein Occlusion      HISTORY OF PRESENT ILLNESS: [name] [name] is a 88 y.o. male who presents to the clinic today for:     Referring physician: Donnajean Lopes, MD [place],  [place] [phone]  HISTORICAL INFORMATION:   Selected notes from the [facility]    Lab Results  Component Value Date   HGBA1C 6.0 (H) 07/03/2018     CURRENT MEDICATIONS: Current Outpatient Medications (Ophthalmic Drugs)  Medication Sig   carboxymethylcellulose (REFRESH PLUS) 0.5 % SOLN Place 1 drop into both eyes daily as needed (dry eyes).   dorzolamide-timolol (COSOPT) 22.3-6.8 MG/ML ophthalmic solution Place 1 drop into the left eye 2 (two) times daily.   No current facility-administered medications for this visit. (Ophthalmic Drugs)   Current Outpatient Medications (Other)  Medication Sig   acetaminophen (TYLENOL) 325 MG tablet Take 2 tablets (650 mg total) by mouth every 6 (six) hours as needed for mild pain.   allopurinol (ZYLOPRIM) 300 MG tablet Take 300 mg by mouth daily.    amLODipine (NORVASC) 10 MG tablet TAKE 1 TABLET BY MOUTH DAILY.   aspirin 81 MG tablet Take 81 mg by mouth daily.   atorvastatin (LIPITOR) 80 MG tablet TAKE 1 TABLET BY MOUTH DAILY.   hydrocortisone cream 1 % Apply 1 application topically daily as needed for itching.   insulin detemir (LEVEMIR) 100 UNIT/ML injection Inject 34 Units into the skin every morning.    Multiple Vitamin (MULTIVITAMIN WITH MINERALS) TABS tablet Take 2 tablets by mouth daily.   nitroGLYCERIN (NITROSTAT) 0.4 MG SL tablet PLACE 1 TABLET UNDER THE TONGUE AS NEEDED FOR CHEST PAIN   No current facility-administered medications for this visit. (Other)      REVIEW OF SYSTEMS: ROS   Negative for: Constitutional, Gastrointestinal, Neurological, Skin, Genitourinary, Musculoskeletal, HENT, Endocrine,  Cardiovascular, Eyes, Respiratory, Psychiatric, Allergic/Imm, Heme/Lymph Last edited by Hurman Horn, MD on 01/07/2022  4:13 PM.       ALLERGIES Allergies  Allergen Reactions   Indocin [Indomethacin] Other (See Comments)    Antiinflammatory medication for gout ? Indocin > black out per patient ? SYNCOPE ?    PAST MEDICAL HISTORY Past Medical History:  Diagnosis Date   Arthritis of back    BPH (benign prostatic hyperplasia)    CKD (chronic kidney disease) stage 3, GFR 30-59 ml/min (HCC)    Colon polyps    Diabetes mellitus, type 2 ([facility])    Diabetic nephropathy ([facility])    Diverticulosis    ED (erectile dysfunction)    Gallstones    GERD (gastroesophageal reflux disease)    Glaucoma    Hiatal hernia    History of gout    History of hiatal hernia    Hx of urinary tract infection    Hypercholesterolemia    Hypertension    Left groin hematoma 07/04/2018   Osteoarthritis    left knee   Pneumonia    PVD (peripheral vascular disease) (HCC)    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   26 mm Edwards Sapien 3 transcatheter heart valve placed via percutaneous right transfemoral approach    Severe aortic stenosis    Sleep apnea    Sleep apnea, obstructive    Past Surgical History:  Procedure Laterality Date   ABDOMINAL AORTIC ANEURYSM REPAIR  1995   APPENDECTOMY  CATARACT EXTRACTION Bilateral 2003   FEMORAL-POPLITEAL BYPASS GRAFT     left   INTRAOPERATIVE TRANSTHORACIC ECHOCARDIOGRAM  07/04/2018   Procedure: INTRAOPERATIVE TRANSTHORACIC ECHOCARDIOGRAM;  Surgeon: Sherren Mocha, MD;  Location: [facility];  Service: Open Heart Surgery;;   LUMBAR SPINE SURGERY  03/2001   REPLACEMENT TOTAL KNEE Bilateral 2004   RIGHT/LEFT HEART CATH AND CORONARY ANGIOGRAPHY N/A 06/13/2018   Procedure: RIGHT/LEFT HEART CATH AND CORONARY ANGIOGRAPHY;  Surgeon: Martinique, Peter M, MD;  Location: [facility];  Service: Cardiovascular;  Laterality: N/A;   TEE WITHOUT  CARDIOVERSION  07/04/2018   Procedure: TRANSESOPHAGEAL ECHOCARDIOGRAM (TEE);  Surgeon: Sherren Mocha, MD;  Location: [facility];  Service: Open Heart Surgery;;   TOTAL KNEE ARTHROPLASTY     bilateral   TRANSCATHETER AORTIC VALVE REPLACEMENT, TRANSFEMORAL  07/04/2018   TRANSCATHETER AORTIC VALVE REPLACEMENT, TRANSFEMORAL N/A 07/04/2018   Procedure: TRANSCATHETER AORTIC VALVE REPLACEMENT, TRANSFEMORAL;  Surgeon: Sherren Mocha, MD;  Location: [facility];  Service: Open Heart Surgery;  Laterality: N/A;   VASECTOMY      FAMILY HISTORY Family History  Problem Relation Age of Onset   Heart attack Mother    Alzheimer's disease Father    Pancreatic cancer Sister     SOCIAL HISTORY Social History   Tobacco Use   Smoking status: Former    Packs/day: 2.00    Years: 30.00    Total pack years: 60.00    Types: Cigarettes    Quit date: 11/04/1984    Years since quitting: 37.2   Smokeless tobacco: Never  Vaping Use   Vaping Use: Never used  Substance Use Topics   Alcohol use: Yes    Alcohol/week: 0.0 standard drinks of alcohol    Comment: rarely   Drug use: No         OPHTHALMIC EXAM:  Base Eye Exam     Visual Acuity (ETDRS)       Right Left   Dist [place] 20/25 20/50 -2   Dist ph [place] NI NI         Tonometry (Tonopen, 4:13 PM)       Right Left   Pressure 17 18         Pupils       Pupils APD   Right PERRL None   Left PERRL None         Visual Fields       Left Right    Full Full         Extraocular Movement       Right Left    Full, Ortho Full, Ortho         Neuro/Psych     Oriented x3: Yes   Mood/Affect: Normal         Dilation     Left eye: 1.0% Mydriacyl, 2.5% Phenylephrine @ 4:14 PM           Slit Lamp and Fundus Exam     External Exam       Right Left   External Normal Normal         Slit Lamp Exam       Right Left   Lids/Lashes Normal Normal   Conjunctiva/Sclera White and quiet White and quiet   Cornea Clear Clear    Anterior Chamber Deep and quiet Deep and quiet   Iris Round and reactive Round and reactive   Lens Centered posterior chamber intraocular lens Centered posterior chamber intraocular lens   Anterior Vitreous Normal Normal  Fundus Exam       Right Left   Posterior Vitreous  Posterior vitreous detachment   Disc  Collaterals on the nerve   C/D Ratio  0.7   Macula  Microaneurysms, Intraretinal hemorrhage, Macular thickening   Vessels  Hemi-CRVO inferiorly, less distended, not engorged   Periphery  No peripheral ischemic changes, no peripheral hemorrhages seen            IMAGING AND PROCEDURES  Imaging and Procedures for 01/07/22  OCT, Retina - OU - Both Eyes       Right Eye Quality was good. Scan locations included subfoveal. Central Foveal Thickness: 277. Progression has been stable. Findings include normal foveal contour.   Left Eye Quality was good. Scan locations included subfoveal. Central Foveal Thickness: 290. Progression has worsened. Findings include abnormal foveal contour, cystoid macular edema.   Notes Much improved CME from massive CRV O as compared to onset August 2021, but after recurrence developed November 2022, now much improved CME from 9 weeks previous postinjection Avastin for CRV O OS we will repeat injection intravitreal Avastin today but due to increased CME will decrease We will 7 weeks     Intravitreal Injection, Pharmacologic Agent - OS - Left Eye       Time Out 01/07/2022. 4:14 PM. Confirmed correct patient, procedure, site, and patient consented.   Anesthesia Topical anesthesia was used. Anesthetic medications included Lidocaine 4%.   Procedure Preparation included 5% betadine to ocular surface, 10% betadine to eyelids, Ofloxacin . A 30 gauge needle was used.   Injection: 1.25 mg Bevacizumab 1.25mg /0.66ml   Route: Intravitreal, Site: Left Eye   NDC: [id number]   Post-op Post injection exam found visual acuity of at least  counting fingers. The patient tolerated the procedure well. There were no complications. The patient received written and verbal post procedure care education. Post injection medications were not given.              ASSESSMENT/PLAN:  Central retinal vein occlusion, left eye, with macular edema Overall much improved still active today at 9-week interval.  We will repeat injection today to protect macular functioning and to resolve CME.  And shorten interval follow-up next to 7 weeks     ICD-10-CM   1. Central retinal vein occlusion, left eye, with macular edema  H34.8120 OCT, Retina - OU - Both Eyes    Intravitreal Injection, Pharmacologic Agent - OS - Left Eye    Bevacizumab (AVASTIN) SOLN 1.25 mg      1.  OS center involved CME from CRV O repeat injection at 9-week interval to improve the CME and shorten interval follow-up next in 7 weeks  2.  3.  Ophthalmic Meds Ordered this visit:  Meds ordered this encounter  Medications   Bevacizumab (AVASTIN) SOLN 1.25 mg       Return in about 7 weeks (around 02/25/2022) for dilate, OS, EYLEA OCT.  There are no Patient Instructions on file for this visit.   Explained the diagnoses, plan, and follow up with the patient and they expressed understanding.  Patient expressed understanding of the importance of proper follow up care.   Alford Highland Yaritzel Stange M.D. Diseases & Surgery of the Retina and Vitreous [facility] 01/07/22     Abbreviations: M myopia (nearsighted); A astigmatism; H hyperopia (farsighted); P presbyopia; Mrx spectacle prescription;  CTL contact lenses; OD right eye; OS left eye; OU both eyes  XT exotropia; ET esotropia; PEK punctate epithelial keratitis; PEE punctate  epithelial erosions; DES dry eye syndrome; MGD meibomian gland dysfunction; ATs artificial tears; PFAT's preservative free artificial tears; NSC nuclear sclerotic cataract; PSC posterior subcapsular cataract; ERM epi-retinal membrane; PVD  posterior vitreous detachment; RD retinal detachment; DM diabetes mellitus; DR diabetic retinopathy; NPDR non-proliferative diabetic retinopathy; PDR proliferative diabetic retinopathy; CSME clinically significant macular edema; DME diabetic macular edema; dbh dot blot hemorrhages; CWS cotton wool spot; POAG primary open angle glaucoma; C/D cup-to-disc ratio; HVF humphrey visual field; GVF goldmann visual field; OCT optical coherence tomography; IOP intraocular pressure; BRVO Branch retinal vein occlusion; CRVO central retinal vein occlusion; CRAO central retinal artery occlusion; BRAO branch retinal artery occlusion; RT retinal tear; SB scleral buckle; PPV pars plana vitrectomy; VH Vitreous hemorrhage; PRP panretinal laser photocoagulation; IVK intravitreal kenalog; VMT vitreomacular traction; MH Macular hole;  NVD neovascularization of the disc; NVE neovascularization elsewhere; AREDS age related eye disease study; ARMD age related macular degeneration; POAG primary open angle glaucoma; EBMD epithelial/anterior basement membrane dystrophy; ACIOL anterior chamber intraocular lens; IOL intraocular lens; PCIOL posterior chamber intraocular lens; Phaco/IOL phacoemulsification with intraocular lens placement; PRK photorefractive keratectomy; LASIK laser assisted in situ keratomileusis; HTN hypertension; DM diabetes mellitus; COPD chronic obstructive pulmonary disease

## 2022-01-23 ENCOUNTER — Other Ambulatory Visit: Payer: Self-pay | Admitting: Cardiology

## 2022-02-25 ENCOUNTER — Ambulatory Visit (INDEPENDENT_AMBULATORY_CARE_PROVIDER_SITE_OTHER): Payer: Medicare Other | Admitting: Ophthalmology

## 2022-02-25 ENCOUNTER — Encounter (INDEPENDENT_AMBULATORY_CARE_PROVIDER_SITE_OTHER): Payer: Self-pay | Admitting: Ophthalmology

## 2022-02-25 DIAGNOSIS — H34812 Central retinal vein occlusion, left eye, with macular edema: Secondary | ICD-10-CM | POA: Diagnosis not present

## 2022-02-25 MED ORDER — BEVACIZUMAB CHEMO INJECTION 1.25MG/0.05ML SYRINGE FOR KALEIDOSCOPE
2.5000 mg | INTRAVITREAL | Status: AC | PRN
  Administered 2022-02-25: 2.5 mg via INTRAVITREAL

## 2022-02-25 NOTE — Progress Notes (Signed)
02/25/2022     CHIEF COMPLAINT Patient presents for  Chief Complaint  Patient presents with   Central Retinal Vein Occlusion      HISTORY OF PRESENT ILLNESS: [name] [name] is a 88 y.o. male who presents to the clinic today for:   HPI   Cental retinal vein occlusion left eye 7 week fu os oct avastin  Pt states his vision has been stable Pt denies any new floaters or FOL  Last edited by Morene Rankins, CMA on 02/25/2022  3:38 PM.      Referring physician: Donnajean Lopes, MD [place],  [place] [address]  HISTORICAL INFORMATION:   Selected notes from the [place]    Lab Results  Component Value Date   HGBA1C 6.0 (H) 07/03/2018     CURRENT MEDICATIONS: Current Outpatient Medications (Ophthalmic Drugs)  Medication Sig   carboxymethylcellulose (REFRESH PLUS) 0.5 % SOLN Place 1 drop into both eyes daily as needed (dry eyes).   dorzolamide-timolol (COSOPT) 22.3-6.8 MG/ML ophthalmic solution Place 1 drop into the left eye 2 (two) times daily.   No current facility-administered medications for this visit. (Ophthalmic Drugs)   Current Outpatient Medications (Other)  Medication Sig   acetaminophen (TYLENOL) 325 MG tablet Take 2 tablets (650 mg total) by mouth every 6 (six) hours as needed for mild pain.   allopurinol (ZYLOPRIM) 300 MG tablet Take 300 mg by mouth daily.    amLODipine (NORVASC) 10 MG tablet TAKE 1 TABLET BY MOUTH DAILY.   aspirin 81 MG tablet Take 81 mg by mouth daily.   atorvastatin (LIPITOR) 80 MG tablet TAKE 1 TABLET BY MOUTH DAILY.   hydrocortisone cream 1 % Apply 1 application topically daily as needed for itching.   insulin detemir (LEVEMIR) 100 UNIT/ML injection Inject 34 Units into the skin every morning.    Multiple Vitamin (MULTIVITAMIN WITH MINERALS) TABS tablet Take 2 tablets by mouth daily.   nitroGLYCERIN (NITROSTAT) 0.4 MG SL tablet PLACE 1 TABLET UNDER THE TONGUE AS NEEDED FOR CHEST PAIN   No current  facility-administered medications for this visit. (Other)      REVIEW OF SYSTEMS: ROS   Negative for: Constitutional, Gastrointestinal, Neurological, Skin, Genitourinary, Musculoskeletal, HENT, Endocrine, Cardiovascular, Eyes, Respiratory, Psychiatric, Allergic/Imm, Heme/Lymph Last edited by Morene Rankins, CMA on 02/25/2022  3:38 PM.       ALLERGIES Allergies  Allergen Reactions   Indocin [Indomethacin] Other (See Comments)    Antiinflammatory medication for gout ? Indocin > black out per patient ? SYNCOPE ?    PAST MEDICAL HISTORY Past Medical History:  Diagnosis Date   Arthritis of back    BPH (benign prostatic hyperplasia)    CKD (chronic kidney disease) stage 3, GFR 30-59 ml/min (HCC)    Colon polyps    Diabetes mellitus, type 2 ([place])    Diabetic nephropathy ([place])    Diverticulosis    ED (erectile dysfunction)    Gallstones    GERD (gastroesophageal reflux disease)    Glaucoma    Hiatal hernia    History of gout    History of hiatal hernia    Hx of urinary tract infection    Hypercholesterolemia    Hypertension    Left groin hematoma 07/04/2018   Osteoarthritis    left knee   Pneumonia    PVD (peripheral vascular disease) (HCC)    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   26 mm Edwards Sapien 3 transcatheter heart valve placed via  percutaneous right transfemoral approach    Severe aortic stenosis    Sleep apnea    Sleep apnea, obstructive    Past Surgical History:  Procedure Laterality Date   ABDOMINAL AORTIC ANEURYSM REPAIR  1995   APPENDECTOMY     CATARACT EXTRACTION Bilateral 2003   FEMORAL-POPLITEAL BYPASS GRAFT     left   INTRAOPERATIVE TRANSTHORACIC ECHOCARDIOGRAM  07/04/2018   Procedure: INTRAOPERATIVE TRANSTHORACIC ECHOCARDIOGRAM;  Surgeon: Tonny Bollman, MD;  Location: [facility] OR;  Service: Open Heart Surgery;;   LUMBAR SPINE SURGERY  03/2001   REPLACEMENT TOTAL KNEE Bilateral 2004   RIGHT/LEFT HEART CATH AND CORONARY  ANGIOGRAPHY N/A 06/13/2018   Procedure: RIGHT/LEFT HEART CATH AND CORONARY ANGIOGRAPHY;  Surgeon: Swaz[place], Peter M, MD;  Location: [facility] INVASIVE CV LAB;  Service: Cardiovascular;  Laterality: N/A;   TEE WITHOUT CARDIOVERSION  07/04/2018   Procedure: TRANSESOPHAGEAL ECHOCARDIOGRAM (TEE);  Surgeon: Tonny Bollman, MD;  Location[facility] OR;  Service: Open Heart Surgery;;   TOTAL KNEE ARTHROPLASTY     bilateral   TRANSCATHETER AORTIC VALVE REPLACEMENT, TRANSFEMORAL  07/04/2018   TRANSCATHETER AORTIC VALVE REPLACEMENT, TRANSFEMORAL N/A 07/04/2018   Procedure: TRANSCATHETER AORTIC VALVE REPLACEMENT, TRANSFEMORAL;  Surgeon: Tonny Bollman, MD;  Location: [facility] OR;  Service: Open Heart Surgery;  Laterality: N/A;   VASECTOMY      FAMILY HISTORY Family History  Problem Relation Age of Onset   Heart attack Mother    Alzheimer's disease Father    Pancreatic cancer Sister     SOCIAL HISTORY Social History   Tobacco Use   Smoking status: Former    Packs/day: 2.00    Years: 30.00    Total pack years: 60.00    Types: Cigarettes    Quit date: 11/04/1984    Years since quitting: 37.3   Smokeless tobacco: Never  Vaping Use   Vaping Use: Never used  Substance Use Topics   Alcohol use: Yes    Alcohol/week: 0.0 standard drinks of alcohol    Comment: rarely   Drug use: No         OPHTHALMIC EXAM:  Base Eye Exam     Visual Acuity (ETDRS)       Right Left   Dist cc 20/30 +2 20/25 -1    Correction: Glasses         Tonometry (Tonopen, 3:45 PM)       Right Left   Pressure 9 6         Extraocular Movement       Right Left    Ortho Ortho    -- -- --  --  --  -- -- --   -- -- --  --  --  -- -- --           Neuro/Psych     Oriented x3: Yes   Mood/Affect: Normal         Dilation     Left eye: 2.5% Phenylephrine, 1.0% Mydriacyl @ 3:41 PM           Slit Lamp and Fundus Exam     External Exam       Right Left   External Normal Normal         Slit Lamp Exam        Right Left   Lids/Lashes Normal Normal   Conjunctiva/Sclera White and quiet White and quiet   Cornea Clear Clear   Anterior Chamber Deep and quiet Deep and quiet   Iris Round and reactive Round and  reactive   Lens Centered posterior chamber intraocular lens Centered posterior chamber intraocular lens   Anterior Vitreous Normal Normal         Fundus Exam       Right Left   Posterior Vitreous  Posterior vitreous detachment   Disc  Collaterals on the nerve   C/D Ratio  0.7   Macula  Microaneurysms, Intraretinal hemorrhage, Macular thickening   Vessels  Hemi-CRVO inferiorly, less distended, not engorged   Periphery  No peripheral ischemic changes, no peripheral hemorrhages seen            IMAGING AND PROCEDURES  Imaging and Procedures for 02/25/22  OCT, Retina - OU - Both Eyes       Right Eye Quality was good. Scan locations included subfoveal. Central Foveal Thickness: 273. Progression has been stable. Findings include normal foveal contour.   Left Eye Quality was good. Scan locations included subfoveal. Central Foveal Thickness: 276. Progression has improved. Findings include abnormal foveal contour, cystoid macular edema.   Notes Much improved CME from massive CRV O as compared to onset August 2021, but after recurrence developed November 2022, now much improved CME from 9 weeks previous postinjection Avastin for CRVO OS we will repeat injection intravitreal Avastin today but due to increased CME will maintain or increase interval him back to 9 weeks       Intravitreal Injection, Pharmacologic Agent - OS - Left Eye       Time Out 02/25/2022. 4:09 PM. Confirmed correct patient, procedure, site, and patient consented.   Anesthesia Topical anesthesia was used. Anesthetic medications included Lidocaine 4%.   Procedure Preparation included 5% betadine to ocular surface, 10% betadine to eyelids, Ofloxacin . A 30 gauge needle was used.   Injection: 2.5 mg  Bevacizumab 1.25mg /0.1ml   Route: Intravitreal, Site: Left Eye   NDC: [id number]   Post-op Post injection exam found visual acuity of at least counting fingers. The patient tolerated the procedure well. There were no complications. The patient received written and verbal post procedure care education. Post injection medications were not given.              ASSESSMENT/PLAN:  Central retinal vein occlusion, left eye, with macular edema OS vastly improved macular edema and excellent visual acuity stabilization on treatment for CRVO with secondary CME.  Today at 7-week interval.  We will maintain 8 to 9-week interval examination next     ICD-10-CM   1. Central retinal vein occlusion, left eye, with macular edema  H34.8120 OCT, Retina - OU - Both Eyes    Intravitreal Injection, Pharmacologic Agent - OS - Left Eye    Bevacizumab (AVASTIN) SOLN 2.5 mg      1.  OS, improved overall CME.  Repeat injection today at 7 weeks follow-up next in 9 weeks   2.  3.  Ophthalmic Meds Ordered this visit:  Meds ordered this encounter  Medications   Bevacizumab (AVASTIN) SOLN 2.5 mg       Return in about 9 weeks (around 04/29/2022) for AVASTIN OCT, dilate, OS.  Patient Instructions  Patient or family to call 2 weeks prior to next visit to confirm that we have participation with [place] [facility] his current plan   Explained the diagnoses, plan, and follow up with the patient and they expressed understanding.  Patient expressed understanding of the importance of proper follow up care.   Alford Highland Jeremih Dearmas M.D. Diseases & Surgery of the Retina and Vitreous [facility] 02/25/22  Abbreviations: M myopia (nearsighted); A astigmatism; H hyperopia (farsighted); P presbyopia; Mrx spectacle prescription;  CTL contact lenses; OD right eye; OS left eye; OU both eyes  XT exotropia; ET esotropia; PEK punctate epithelial keratitis; PEE punctate epithelial erosions; DES dry eye  syndrome; MGD meibomian gland dysfunction; ATs artificial tears; PFAT's preservative free artificial tears; [place] nuclear sclerotic cataract; PSC posterior subcapsular cataract; ERM epi-retinal membrane; PVD posterior vitreous detachment; RD retinal detachment; DM diabetes mellitus; DR diabetic retinopathy; NPDR non-proliferative diabetic retinopathy; PDR proliferative diabetic retinopathy; CSME clinically significant macular edema; DME diabetic macular edema; dbh dot blot hemorrhages; CWS cotton wool spot; POAG primary open angle glaucoma; C/D cup-to-disc ratio; HVF humphrey visual field; GVF goldmann visual field; OCT optical coherence tomography; IOP intraocular pressure; BRVO Branch retinal vein occlusion; CRVO central retinal vein occlusion; CRAO central retinal artery occlusion; BRAO branch retinal artery occlusion; RT retinal tear; SB scleral buckle; PPV pars plana vitrectomy; VH Vitreous hemorrhage; PRP panretinal laser photocoagulation; IVK intravitreal kenalog; VMT vitreomacular traction; MH Macular hole;  NVD neovascularization of the disc; NVE neovascularization elsewhere; AREDS age related eye disease study; ARMD age related macular degeneration; POAG primary open angle glaucoma; EBMD epithelial/anterior basement membrane dystrophy; ACIOL anterior chamber intraocular lens; IOL intraocular lens; PCIOL posterior chamber intraocular lens; Phaco/IOL phacoemulsification with intraocular lens placement; [place] photorefractive keratectomy; LASIK laser assisted in situ keratomileusis; HTN hypertension; DM diabetes mellitus; COPD chronic obstructive pulmonary disease

## 2022-02-25 NOTE — Assessment & Plan Note (Signed)
OS vastly improved macular edema and excellent visual acuity stabilization on treatment for CRVO with secondary CME.  Today at 7-week interval.  We will maintain 8 to 9-week interval examination next

## 2022-02-25 NOTE — Patient Instructions (Signed)
Patient or family to call 2 weeks prior to next visit to confirm that we have participation with [place] [facility] his current plan

## 2022-04-29 ENCOUNTER — Encounter (INDEPENDENT_AMBULATORY_CARE_PROVIDER_SITE_OTHER): Payer: Medicare Other | Admitting: Ophthalmology

## 2022-05-05 ENCOUNTER — Other Ambulatory Visit: Payer: Self-pay | Admitting: Cardiology

## 2022-05-26 ENCOUNTER — Other Ambulatory Visit: Payer: Self-pay | Admitting: Cardiology

## 2022-06-25 ENCOUNTER — Ambulatory Visit: Payer: Medicare Other | Admitting: Nurse Practitioner

## 2022-07-08 ENCOUNTER — Ambulatory Visit: Payer: Medicare Other | Admitting: General Practice

## 2022-07-15 ENCOUNTER — Emergency Department (HOSPITAL_COMMUNITY)
Admission: EM | Admit: 2022-07-15 | Discharge: 2022-07-15 | Disposition: A | Discharge status: Home / Self Care | Payer: Medicare Other | Attending: Emergency Medicine | Admitting: Emergency Medicine

## 2022-07-15 ENCOUNTER — Other Ambulatory Visit: Payer: Self-pay

## 2022-07-15 ENCOUNTER — Ambulatory Visit: Payer: Medicare Other | Admitting: Psychiatry

## 2022-07-15 ENCOUNTER — Emergency Department (HOSPITAL_COMMUNITY): Payer: Medicare Other

## 2022-07-15 DIAGNOSIS — E1122 Type 2 diabetes mellitus with diabetic chronic kidney disease: Secondary | ICD-10-CM | POA: Diagnosis not present

## 2022-07-15 DIAGNOSIS — R519 Headache, unspecified: Secondary | ICD-10-CM | POA: Diagnosis not present

## 2022-07-15 DIAGNOSIS — Z7982 Long term (current) use of aspirin: Secondary | ICD-10-CM | POA: Insufficient documentation

## 2022-07-15 DIAGNOSIS — F0390 Unspecified dementia without behavioral disturbance: Secondary | ICD-10-CM | POA: Diagnosis not present

## 2022-07-15 DIAGNOSIS — N189 Chronic kidney disease, unspecified: Secondary | ICD-10-CM | POA: Diagnosis not present

## 2022-07-15 DIAGNOSIS — Z79899 Other long term (current) drug therapy: Secondary | ICD-10-CM | POA: Diagnosis not present

## 2022-07-15 DIAGNOSIS — I129 Hypertensive chronic kidney disease with stage 1 through stage 4 chronic kidney disease, or unspecified chronic kidney disease: Secondary | ICD-10-CM | POA: Insufficient documentation

## 2022-07-15 LAB — BASIC METABOLIC PANEL
Anion gap: 5 (ref 5–15)
BUN: 22 mg/dL (ref 8–23)
CO2: 25 mmol/L (ref 22–32)
Calcium: 9 mg/dL (ref 8.9–10.3)
Chloride: 104 mmol/L (ref 98–111)
Creatinine, Ser: 1.44 mg/dL — ABNORMAL HIGH (ref 0.61–1.24)
GFR, Estimated: 47 mL/min — ABNORMAL LOW (ref >60)
Glucose, Bld: 73 mg/dL (ref 70–99)
Potassium: 4 mmol/L (ref 3.5–5.1)
Sodium: 134 mmol/L — ABNORMAL LOW (ref 135–145)

## 2022-07-15 LAB — CBC WITH DIFFERENTIAL/PLATELET
Abs Immature Granulocytes: 0.01 10*3/uL (ref 0.00–0.07)
Basophils Absolute: 0 10*3/uL (ref 0.0–0.1)
Basophils Relative: 0 %
Eosinophils Absolute: 0 10*3/uL (ref 0.0–0.5)
Eosinophils Relative: 0 %
HCT: 35.1 % — ABNORMAL LOW (ref 39.0–52.0)
Hemoglobin: 11.4 g/dL — ABNORMAL LOW (ref 13.0–17.0)
Immature Granulocytes: 0 %
Lymphocytes Relative: 8 %
Lymphs Abs: 0.4 10*3/uL — ABNORMAL LOW (ref 0.7–4.0)
MCH: 33 pg (ref 26.0–34.0)
MCHC: 32.5 g/dL (ref 30.0–36.0)
MCV: 101.7 fL — ABNORMAL HIGH (ref 80.0–100.0)
Monocytes Absolute: 0.4 10*3/uL (ref 0.1–1.0)
Monocytes Relative: 7 %
Neutro Abs: 4.5 10*3/uL (ref 1.7–7.7)
Neutrophils Relative %: 85 %
Platelets: 80 10*3/uL — ABNORMAL LOW (ref 150–400)
RBC: 3.45 MIL/uL — ABNORMAL LOW (ref 4.22–5.81)
RDW: 14.6 % (ref 11.5–15.5)
WBC: 5.3 10*3/uL (ref 4.0–10.5)
nRBC: 0 % (ref 0.0–0.2)

## 2022-07-15 LAB — SEDIMENTATION RATE: Sed Rate: 9 mm/hr (ref 0–16)

## 2022-07-15 MED ORDER — ONDANSETRON HCL 4 MG/2ML IJ SOLN
4.0000 mg | Freq: Once | INTRAMUSCULAR | Status: AC
  Administered 2022-07-15: 4 mg via INTRAVENOUS
  Filled 2022-07-15: qty 2

## 2022-07-15 MED ORDER — HYDROCODONE-ACETAMINOPHEN 5-325 MG PO TABS: 1.0000 | ORAL_TABLET | Freq: Four times a day (QID) | ORAL | 0 refills | Status: AC | PRN

## 2022-07-15 MED ORDER — HYDROMORPHONE HCL 1 MG/ML IJ SOLN
0.5000 mg | Freq: Once | INTRAMUSCULAR | Status: AC
  Administered 2022-07-15: 0.5 mg via INTRAVENOUS
  Filled 2022-07-15: qty 1

## 2022-07-15 MED ORDER — GABAPENTIN 100 MG PO CAPS: 300.0000 mg | ORAL_CAPSULE | Freq: Three times a day (TID) | ORAL | 0 refills | Status: DC

## 2022-07-15 MED ORDER — FENTANYL CITRATE PF 50 MCG/ML IJ SOSY
50.0000 ug | PREFILLED_SYRINGE | Freq: Once | INTRAMUSCULAR | Status: AC
  Administered 2022-07-15: 50 ug via INTRAVENOUS
  Filled 2022-07-15: qty 1

## 2022-07-15 MED ORDER — GABAPENTIN 300 MG PO CAPS
300.0000 mg | ORAL_CAPSULE | Freq: Once | ORAL | Status: AC
  Administered 2022-07-15: 300 mg via ORAL
  Filled 2022-07-15: qty 1

## 2022-07-15 NOTE — ED Provider Notes (Signed)
Patient care taken over at shift handoff from Benedetto Goad, [place]  In short, 89 year old male patient with history of dementia, hypertension, peripheral vascular disease, diabetes, hyperlipidemia, CKD presented to the emergency department complaining of severe headache.  Patient reported sharp pains in the left upper scalp radiating down to his left eyebrow.  Pain described as sharp and shooting.  Pains been present intermittently for the past few months but have been worsening over the past 4 to 6 weeks.  Complains of pain which worsens with movement of the forehead and worsens significantly with any touch of the area.  The patient was reportedly prescribed tramadol and 100 mg gabapentin to be taken 3 times daily by his primary care provider last week but has noted no improvement with these medications.  He did reportedly have shingles approximately 6 months ago with some lesions in this area.  Patient denied any prior history of similar headaches or pain.  Lab work prior to my assumption of care included BMP, CBC which were grossly unremarkable.  Sedimentation rate was 9.  CT head was performed with no acute findings Physical Exam  BP (!) 141/66 (BP Location: Left Arm)   Pulse 78   Temp 97.9 F (36.6 C) (Axillary)   Resp 15   Ht 5\' 9"  (1.753 m)   Wt 99.8 kg   SpO2 96%   BMI 32.49 kg/m   Physical Exam Vitals and nursing note reviewed.  Constitutional:      General: He is not in acute distress. HENT:     Head: Normocephalic.  Cardiovascular:     Rate and Rhythm: Normal rate.  Pulmonary:     Effort: Pulmonary effort is normal.  Neurological:     Mental Status: He is alert.     Procedures  Procedures  ED Course / MDM    Medical Decision Making Amount and/or Complexity of Data Reviewed Labs: ordered. Radiology: ordered.  Risk Prescription drug management.   Differential diagnosis includes postherpetic neuralgia, trigeminal neuralgia, temporal arteritis, and others  Based  on the history this seems consistent with postherpetic neuralgia.  Physical examination also matches warm postherpetic neuralgia considered trigeminal neuralgia.  Sedimentation rate was normal.  Unfortunately there was a problem with the lab and trying to get the CRP.  I discussed the case with my attending physician, Dr.Scheving, who felt that the normal sed rate was a reasonable enough test to allow discharge at this time versus trying to draw more labs from the patient.  Plan to discharge patient home at this time with prescription for Norco, increase gabapentin to 300 mg 3 times daily, and neurology referral.  Patient and his daughter voiced understanding to the plan.  Prescription sent.  Return precautions provided.  Discharged home       Ronny Bacon 07/15/22 1852    Cristie Hem, MD 07/15/22 Joen Laura

## 2022-07-15 NOTE — ED Notes (Signed)
RN reviewed discharge instructions with pt. Pt verbalized understanding and had no further questions. VSS upon discharge. ?

## 2022-07-15 NOTE — ED Provider Notes (Signed)
[name] Provider Note   CSN: [id number] Arrival date & time: 07/15/22  1230     History  Chief Complaint  Patient presents with   Headache    [name] [name] is a 89 y.o. male.  [name] [name] is a 89 y.o. male with a history of dementia, hypertension, peripheral vascular disease, diabetes, hyperlipidemia, CKD, who presents to the emergency department for evaluation of severe headache.  Patient reports he has had sharp pains starting in the left upper scalp and radiating down to his left eyebrow.  He reports they are sharp shooting pains.  They have been present intermittently over the past few months but over the past 4 to 6 weeks have become more severe.  Presents today because he could not handle the pain.  He reports pain is worse with movement of the forehead or if anything touches this area.  He was prescribed tramadol and gabapentin 100 mg 3 times daily by his primary care provider last week but reports no improvement with these medications.  His daughter reports that 6 to 9 months ago he had shingles and had some lesions in this area.  Patient denies prior history of similar headaches or pain.  No recent head injury or trauma.  No associated visual changes, jaw pain, weight loss, neck pain, fevers or chills.  No weakness, numbness, no speech changes.  The history is provided by the patient and a relative.  Headache Associated symptoms: no abdominal pain, no cough, no eye pain, no fever, no nausea, no neck pain, no neck stiffness, no numbness, no vomiting and no weakness        Home Medications Prior to Admission medications   Medication Sig Start Date End Date Taking? Authorizing Provider  acetaminophen (TYLENOL) 325 MG tablet Take 2 tablets (650 mg total) by mouth every 6 (six) hours as needed for mild pain. 07/05/18  Yes Lars Pinks M, PA-C  amLODipine (NORVASC) 10 MG tablet TAKE 1 TABLET (10 MG TOTAL) BY MOUTH DAILY. MUST SCHEDULE  APPOINTMENT FOR FUTURE REFILLS Patient taking differently: Take 10 mg by mouth daily. 05/26/22  Yes Martinique, Peter M, MD  aspirin 81 MG tablet Take 81 mg by mouth 2 (two) times a week.   Yes [provider]  atorvastatin (LIPITOR) 80 MG tablet TAKE 1 TABLET BY MOUTH DAILY. Patient taking differently: Take 80 mg by mouth once a week. 07/15/21  Yes Martinique, Peter M, MD  carboxymethylcellulose (REFRESH PLUS) 0.5 % SOLN Place 1 drop into both eyes daily as needed (dry eyes).   Yes [provider]  dorzolamide-timolol (COSOPT) 22.3-6.8 MG/ML ophthalmic solution Place 1 drop into the left eye 2 (two) times daily.   Yes [provider]  finasteride (PROSCAR) 5 MG tablet Take 5 mg by mouth daily.   Yes [provider]  hydrocortisone cream 1 % Apply 1 application topically daily as needed for itching.   Yes [provider]  Multiple Vitamin (MULTIVITAMIN WITH MINERALS) TABS tablet Take 1 tablet by mouth daily.   Yes [provider]  nitroGLYCERIN (NITROSTAT) 0.4 MG SL tablet Place 0.4 mg under the tongue every 5 (five) minutes as needed for chest pain. 02/11/18  Yes [provider]  traMADol (ULTRAM) 50 MG tablet Take 50 mg by mouth 3 (three) times daily as needed for moderate pain. 07/12/22  Yes [provider]  TRESIBA FLEXTOUCH 100 UNIT/ML FlexTouch Pen Inject 32 Units into the skin daily. 06/08/22  Yes  [provider]  triamcinolone cream (KENALOG) 0.1 % Apply 1 Application topically 2 (two) times daily as needed (rash- itching). 04/29/22  Yes [provider]  allopurinol (ZYLOPRIM) 300 MG tablet Take 300 mg by mouth daily.  Patient not taking: Reported on 07/15/2022 05/17/12   [provider]  gabapentin (NEURONTIN) 100 MG capsule Take 100 mg by mouth every 8 (eight) hours as needed. Patient not taking: Reported on 07/15/2022 03/16/22   [provider]      Allergies    Indocin [indomethacin]    Review  of Systems   Review of Systems  Constitutional:  Negative for chills and fever.  HENT: Negative.    Eyes:  Negative for pain and visual disturbance.  Respiratory:  Negative for cough and shortness of breath.   Cardiovascular:  Negative for chest pain.  Gastrointestinal:  Negative for abdominal pain, nausea and vomiting.  Musculoskeletal:  Negative for neck pain and neck stiffness.  Skin:  Negative for rash.  Neurological:  Positive for headaches. Negative for syncope, facial asymmetry, weakness, light-headedness and numbness.  All other systems reviewed and are negative.   Physical Exam Updated Vital Signs BP (!) 161/69 (BP Location: Left Arm)   Pulse 74   Temp 97.8 F (36.6 C) (Oral)   Resp 16   Ht 5\' 9"  (1.753 m)   Wt 99.8 kg   SpO2 96%   BMI 32.49 kg/m  Physical Exam Vitals and nursing note reviewed.  Constitutional:      General: He is not in acute distress.    Appearance: Normal appearance. He is well-developed. He is not ill-appearing or diaphoretic.  HENT:     Head: Normocephalic and atraumatic.     Comments: Exquisite tenderness to even light touch or movement of the left forehead, no overlying erythema, lesions or skin changes.  No temporal artery tenderness, temporal artery is not bulging or prominent.  No temporal artery bruit noted.  No hematoma, step-off, deformity or other signs of trauma. Eyes:     General:        Right eye: No discharge.        Left eye: No discharge.     Extraocular Movements: Extraocular movements intact.     Pupils: Pupils are equal, round, and reactive to light.  Cardiovascular:     Rate and Rhythm: Normal rate and regular rhythm.     Heart sounds: Normal heart sounds.  Pulmonary:     Effort: Pulmonary effort is normal. No respiratory distress.     Breath sounds: Normal breath sounds.  Musculoskeletal:     Cervical back: Neck supple. No rigidity.  Neurological:     Mental Status: He is alert and oriented to person, place, and time.      GCS: GCS eye subscore is 4. GCS verbal subscore is 5. GCS motor subscore is 6.     Coordination: Coordination normal.     Comments: Speech is clear, able to follow commands CN III-XII intact Normal strength in upper and lower extremities bilaterally including dorsiflexion and plantar flexion, strong and equal grip strength Sensation normal to light and sharp touch Moves extremities without ataxia, coordination intact  Psychiatric:        Mood and Affect: Mood normal.        Behavior: Behavior normal.     ED Results / Procedures / Treatments   Labs (all labs ordered are listed, but only abnormal results are displayed) Labs Reviewed  CBC WITH DIFFERENTIAL/PLATELET - Abnormal; Notable  for the following components:      Result Value   RBC 3.45 (*)    Hemoglobin 11.4 (*)    HCT 35.1 (*)    MCV 101.7 (*)    Platelets 80 (*)    Lymphs Abs 0.4 (*)    All other components within normal limits  SEDIMENTATION RATE  C-REACTIVE PROTEIN  BASIC METABOLIC PANEL    EKG None  Radiology CT Head Wo Contrast  Result Date: 07/15/2022 CLINICAL DATA:  Provided history: Headache, new onset EXAM: CT HEAD WITHOUT CONTRAST TECHNIQUE: Contiguous axial images were obtained from the base of the skull through the vertex without intravenous contrast. RADIATION DOSE REDUCTION: This exam was performed according to the departmental dose-optimization program which includes automated exposure control, adjustment of the mA and/or kV according to patient size and/or use of iterative reconstruction technique. COMPARISON:  No pertinent prior exams available for comparison. FINDINGS: Brain: Mild cerebral atrophy. Chronic lacunar infarct within the right basal ganglia. Mild patchy and ill-defined hypoattenuation within the cerebral white matter, nonspecific but compatible with chronic small vessel disease. There is no acute intracranial hemorrhage. No demarcated cortical infarct. No extra-axial fluid collection. No  evidence of an intracranial mass. No midline shift. Vascular: No hyperdense vessel. Atherosclerotic calcifications. Skull: No fracture or aggressive osseous lesion. Sinuses/Orbits: No mass or acute finding within the imaged orbits. 10 mm mucous retention cyst within a posterior left ethmoid air cell. Minimal mucosal thickening elsewhere within the bilateral ethmoid sinuses. 9 mm mucous retention cyst within the left maxillary sinus. IMPRESSION: 1. No evidence of acute intracranial abnormality. 2. Parenchymal atrophy and chronic small vessel disease. 3. Paranasal sinus disease, as described. Electronically Signed   By: Kellie Simmering D.O.   On: 07/15/2022 14:32    Procedures Procedures    Medications Ordered in ED Medications  fentaNYL (SUBLIMAZE) injection 50 mcg (50 mcg Intravenous Given 07/15/22 1400)  gabapentin (NEURONTIN) capsule 300 mg (300 mg Oral Given 07/15/22 1509)  HYDROmorphone (DILAUDID) injection 0.5 mg (0.5 mg Intravenous Given 07/15/22 1508)  ondansetron (ZOFRAN) injection 4 mg (4 mg Intravenous Given 07/15/22 1508)    ED Course/ Medical Decision Making/ A&P                             Medical Decision Making Amount and/or Complexity of Data Reviewed Labs: ordered. Radiology: ordered.  Risk Prescription drug management.   89 y.o. male presents to the ED with complaints of severe pain to the left scalp and forehead, this involves an extensive number of treatment options, and is a complaint that carries with it a high risk of complications and morbidity.  The differential diagnosis includes postherpetic neuralgia, trigeminal neuralgia, temporal arteritis, intracranial mass.  Also considered acute angle-closure glaucoma, but given intermittent nature of pain feel this is less likely, consider cluster headaches but these would be quite rare to develop in an 89 year old male, patient with no associated mydriasis, ptosis, tearing.  On arrival pt is nontoxic, vitals significant for mild  hypertension. Exam without neurologic deficits  Additional history obtained from daughter at bedside. Previous records obtained and reviewed   I ordered medication including fentanyl which patient had no initial relief with, patient has been on low-dose gabapentin without relief, gave 300 mg dose of gabapentin as well as dose of Dilaudid to try and achieve some pain relief  Lab Tests:  I Ordered, reviewed, and interpreted labs, which included: CBC without leukocytosis and hemoglobin stable.  BMP, ESR and CRP pending  Imaging Studies ordered:  I ordered imaging studies which included CT head, I independently visualized and interpreted imaging which showed intracranial mass or bleeding, no other acute abnormalities noted.  ED Course:   Highest clinical suspicion is for postherpetic neuralgia, recommend increase gabapentin dosing and additional pain relief, with plans for discharge home with neurology referral as long as ESR and CRP are within normal range.  If these labs are elevated would recommend neurology consult to consider high-dose steroids and outpatient referral for temporal artery biopsy.  At shift change care signed out to PA Barrie Dunker who will follow-up on pending labs and disposition appropriately.  I discussed this plan with patient and daughter at bedside.  Portions of this note were generated with Scientist, clinical (histocompatibility and immunogenetics). Dictation errors may occur despite best attempts at proofreading.         Final Clinical Impression(s) / ED Diagnoses Final diagnoses:  Left-sided headache    Rx / DC Orders ED Discharge Orders     None         Dartha Lodge, PA-C 07/15/22 1555    Virgina Norfolk, DO 07/16/22 [phone]

## 2022-07-15 NOTE — ED Notes (Signed)
Pharmacy at bedside

## 2022-07-15 NOTE — Progress Notes (Deleted)
Referring:  Donnajean Lopes, MD [address] [place],  [place] [address]  PCP: Donnajean Lopes, MD  Neurology was asked to evaluate Ramos Sheppard, an 89 year old male for a chief complaint of headaches.  Our recommendations of care will be communicated by shared medical record.    CC:  headaches  History provided from ***  HPI:  Medical co-morbidities: HTN, HLD, PVD, CAD, aortic stenosis, CRVO, sleep apnea, DM2, CKD 3  The patient presents for evaluation of headaches which began***  Headache History: Onset: Triggers: Aura: Location: Quality/Description: Associated Symptoms:  Photophobia:  Phonophobia:  Nausea: Vomiting: Allodynia: Other symptoms: Worse with activity?: Duration of headaches:  Headache days per month: *** Migraine days per month: *** Headache free days per month: ***  Current Treatment: Abortive ***  Preventative ***  Prior Therapies                                 ***   LABS: ***  IMAGING:  ***  ***Imaging independently reviewed on July 15, 2022   Current Outpatient Medications on File Prior to Visit  Medication Sig Dispense Refill   acetaminophen (TYLENOL) 325 MG tablet Take 2 tablets (650 mg total) by mouth every 6 (six) hours as needed for mild pain.     allopurinol (ZYLOPRIM) 300 MG tablet Take 300 mg by mouth daily.      amLODipine (NORVASC) 10 MG tablet TAKE 1 TABLET (10 MG TOTAL) BY MOUTH DAILY. ***MUST SCHEDULE APPOINTMENT FOR FUTURE REFILLS*** 30 tablet 6   aspirin 81 MG tablet Take 81 mg by mouth daily.     atorvastatin (LIPITOR) 80 MG tablet TAKE 1 TABLET BY MOUTH DAILY. 90 tablet 2   carboxymethylcellulose (REFRESH PLUS) 0.5 % SOLN Place 1 drop into both eyes daily as needed (dry eyes).     dorzolamide-timolol (COSOPT) 22.3-6.8 MG/ML ophthalmic solution Place 1 drop into the left eye 2 (two) times daily.     hydrocortisone cream 1 % Apply 1 application topically daily as needed for itching.     insulin detemir  (LEVEMIR) 100 UNIT/ML injection Inject 34 Units into the skin every morning.      Multiple Vitamin (MULTIVITAMIN WITH MINERALS) TABS tablet Take 2 tablets by mouth daily.     nitroGLYCERIN (NITROSTAT) 0.4 MG SL tablet PLACE 1 TABLET UNDER THE TONGUE AS NEEDED FOR CHEST PAIN  12   No current facility-administered medications on file prior to visit.     Allergies: Allergies  Allergen Reactions   Indocin [Indomethacin] Other (See Comments)    Antiinflammatory medication for gout ? Indocin > black out per patient ? SYNCOPE ?    Family History: Migraine or other headaches in the family:  *** Aneurysms in a first degree relative:  *** Brain tumors in the family:  *** Other neurological illness in the family:   ***  Past Medical History: Past Medical History:  Diagnosis Date   Arthritis of back    BPH (benign prostatic hyperplasia)    CKD (chronic kidney disease) stage 3, GFR 30-59 ml/min (HCC)    Colon polyps    Diabetes mellitus, type 2 ([facility])    Diabetic nephropathy ([facility])    Diverticulosis    ED (erectile dysfunction)    Gallstones    GERD (gastroesophageal reflux disease)    Glaucoma    Hiatal hernia    History of gout    History of hiatal hernia  Hx of urinary tract infection    Hypercholesterolemia    Hypertension    Left groin hematoma 07/04/2018   Osteoarthritis    left knee   Pneumonia    PVD (peripheral vascular disease) (HCC)    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   26 mm Edwards Sapien 3 transcatheter heart valve placed via percutaneous right transfemoral approach    Severe aortic stenosis    Sleep apnea    Sleep apnea, obstructive     Past Surgical History Past Surgical History:  Procedure Laterality Date   ABDOMINAL AORTIC ANEURYSM REPAIR  1995   APPENDECTOMY     CATARACT EXTRACTION Bilateral 2003   FEMORAL-POPLITEAL BYPASS GRAFT     left   INTRAOPERATIVE TRANSTHORACIC ECHOCARDIOGRAM  07/04/2018   Procedure: INTRAOPERATIVE  TRANSTHORACIC ECHOCARDIOGRAM;  Surgeon: Sherren Mocha, MD;  Location: [facility];  Service: Open Heart Surgery;;   LUMBAR SPINE SURGERY  03/2001   REPLACEMENT TOTAL KNEE Bilateral 2004   RIGHT/LEFT HEART CATH AND CORONARY ANGIOGRAPHY N/A 06/13/2018   Procedure: RIGHT/LEFT HEART CATH AND CORONARY ANGIOGRAPHY;  Surgeon: Martinique, Peter M, MD;  Location: [facility];  Service: Cardiovascular;  Laterality: N/A;   TEE WITHOUT CARDIOVERSION  07/04/2018   Procedure: TRANSESOPHAGEAL ECHOCARDIOGRAM (TEE);  Surgeon: Sherren Mocha, MD;  Location: [facility];  Service: Open Heart Surgery;;   TOTAL KNEE ARTHROPLASTY     bilateral   TRANSCATHETER AORTIC VALVE REPLACEMENT, TRANSFEMORAL  07/04/2018   TRANSCATHETER AORTIC VALVE REPLACEMENT, TRANSFEMORAL N/A 07/04/2018   Procedure: TRANSCATHETER AORTIC VALVE REPLACEMENT, TRANSFEMORAL;  Surgeon: Sherren Mocha, MD;  Location: [facility];  Service: Open Heart Surgery;  Laterality: N/A;   VASECTOMY      Social History: Social History   Tobacco Use   Smoking status: Former    Packs/day: 2.00    Years: 30.00    Total pack years: 60.00    Types: Cigarettes    Quit date: 11/04/1984    Years since quitting: 37.7   Smokeless tobacco: Never  Vaping Use   Vaping Use: Never used  Substance Use Topics   Alcohol use: Yes    Alcohol/week: 0.0 standard drinks of alcohol    Comment: rarely   Drug use: No   ***  ROS: Negative for fevers, chills. Positive for***. All other systems reviewed and negative unless stated otherwise in HPI.   Physical Exam:   Vital Signs: There were no vitals taken for this visit. GENERAL: well appearing,in no acute distress,alert SKIN:  Color, texture, turgor normal. No rashes or lesions HEAD:  Normocephalic/atraumatic. CV:  RRR RESP: Normal respiratory effort MSK: no tenderness to palpation over occiput, neck, or shoulders  NEUROLOGICAL: Mental Status: Alert, oriented to person, place and time,Follows commands Cranial Nerves:  PERRL, visual fields intact to confrontation, extraocular movements intact, facial sensation intact, no facial droop or ptosis, hearing grossly intact, no dysarthria, palate elevate symmetrically, tongue protrudes midline, shoulder shrug intact and symmetric Motor: muscle strength 5/5 both upper and lower extremities,no drift, normal tone Reflexes: 2+ throughout Sensation: intact to light touch all 4 extremities Coordination: Finger-to- nose-finger intact bilaterally Gait: normal-based   IMPRESSION: ***  PLAN: ***   I spent a total of *** minutes chart reviewing and counseling the patient. Headache education was done. Discussed treatment options including preventive and acute medications, natural supplements, and physical therapy. Discussed medication overuse headache and to limit use of acute treatments to no more than 2 days/week or 10 days/month. Discussed medication side effects, adverse reactions and  drug interactions. Written educational materials and patient instructions outlining all of the above were given.  Follow-up: ***   Genia Harold, MD 07/15/2022   8:30 AM

## 2022-07-15 NOTE — ED Triage Notes (Addendum)
Pt BIB EMS for headache. Pt has had left sided headache and eye for 4 months, increased pain 3-4 weeks. Pt could not handle pain today. Pt has shingles to same area last year. PCP prescribed tramadol and gabapentin which didn't help pt. Axox4. VSS.

## 2022-07-15 NOTE — Discharge Instructions (Addendum)
You were evaluated today for a headache. Your headache is consistent with post-herpetic neuralgia. I have increased your gabapentin dosage. I have also changed your pain medication from Tramadol to Norco. Do not take the Tramadol while taking the Norco. Do not exceed 4000mg  acetaminophen from all sources. Norco has 325mg  acetaminophen in each tablet. I have placed a referral for neurology for follow up.

## 2022-07-22 ENCOUNTER — Ambulatory Visit: Payer: Medicare Other | Admitting: General Practice

## 2022-07-25 ENCOUNTER — Other Ambulatory Visit: Payer: Self-pay

## 2022-07-25 ENCOUNTER — Inpatient Hospital Stay (HOSPITAL_COMMUNITY)
Admission: EM | Admit: 2022-07-25 | Discharge: 2022-08-04 | DRG: 871 | Disposition: A | Discharge status: Skilled Nursing Facility | Payer: Medicare Other | Attending: Internal Medicine | Admitting: Internal Medicine

## 2022-07-25 ENCOUNTER — Emergency Department (HOSPITAL_COMMUNITY): Payer: Medicare Other

## 2022-07-25 DIAGNOSIS — Z515 Encounter for palliative care: Secondary | ICD-10-CM | POA: Diagnosis not present

## 2022-07-25 DIAGNOSIS — J690 Pneumonitis due to inhalation of food and vomit: Secondary | ICD-10-CM | POA: Diagnosis present

## 2022-07-25 DIAGNOSIS — E11649 Type 2 diabetes mellitus with hypoglycemia without coma: Secondary | ICD-10-CM | POA: Diagnosis not present

## 2022-07-25 DIAGNOSIS — N179 Acute kidney failure, unspecified: Secondary | ICD-10-CM | POA: Insufficient documentation

## 2022-07-25 DIAGNOSIS — Z96653 Presence of artificial knee joint, bilateral: Secondary | ICD-10-CM | POA: Diagnosis present

## 2022-07-25 DIAGNOSIS — E1122 Type 2 diabetes mellitus with diabetic chronic kidney disease: Secondary | ICD-10-CM | POA: Diagnosis present

## 2022-07-25 DIAGNOSIS — K219 Gastro-esophageal reflux disease without esophagitis: Secondary | ICD-10-CM | POA: Diagnosis present

## 2022-07-25 DIAGNOSIS — W07XXXA Fall from chair, initial encounter: Secondary | ICD-10-CM | POA: Diagnosis present

## 2022-07-25 DIAGNOSIS — Z86010 Personal history of colonic polyps: Secondary | ICD-10-CM

## 2022-07-25 DIAGNOSIS — Z794 Long term (current) use of insulin: Secondary | ICD-10-CM | POA: Diagnosis not present

## 2022-07-25 DIAGNOSIS — Z8679 Personal history of other diseases of the circulatory system: Secondary | ICD-10-CM

## 2022-07-25 DIAGNOSIS — T402X5A Adverse effect of other opioids, initial encounter: Secondary | ICD-10-CM | POA: Diagnosis not present

## 2022-07-25 DIAGNOSIS — N400 Enlarged prostate without lower urinary tract symptoms: Secondary | ICD-10-CM | POA: Diagnosis present

## 2022-07-25 DIAGNOSIS — E1169 Type 2 diabetes mellitus with other specified complication: Secondary | ICD-10-CM | POA: Diagnosis not present

## 2022-07-25 DIAGNOSIS — R0902 Hypoxemia: Secondary | ICD-10-CM | POA: Diagnosis present

## 2022-07-25 DIAGNOSIS — W19XXXA Unspecified fall, initial encounter: Secondary | ICD-10-CM

## 2022-07-25 DIAGNOSIS — I129 Hypertensive chronic kidney disease with stage 1 through stage 4 chronic kidney disease, or unspecified chronic kidney disease: Secondary | ICD-10-CM | POA: Diagnosis present

## 2022-07-25 DIAGNOSIS — E785 Hyperlipidemia, unspecified: Secondary | ICD-10-CM | POA: Diagnosis present

## 2022-07-25 DIAGNOSIS — K5900 Constipation, unspecified: Secondary | ICD-10-CM | POA: Diagnosis not present

## 2022-07-25 DIAGNOSIS — B0229 Other postherpetic nervous system involvement: Secondary | ICD-10-CM | POA: Diagnosis present

## 2022-07-25 DIAGNOSIS — I2489 Other forms of acute ischemic heart disease: Secondary | ICD-10-CM | POA: Diagnosis not present

## 2022-07-25 DIAGNOSIS — Z8249 Family history of ischemic heart disease and other diseases of the circulatory system: Secondary | ICD-10-CM

## 2022-07-25 DIAGNOSIS — I69391 Dysphagia following cerebral infarction: Secondary | ICD-10-CM

## 2022-07-25 DIAGNOSIS — R1310 Dysphagia, unspecified: Secondary | ICD-10-CM | POA: Diagnosis present

## 2022-07-25 DIAGNOSIS — Y92009 Unspecified place in unspecified non-institutional (private) residence as the place of occurrence of the external cause: Secondary | ICD-10-CM

## 2022-07-25 DIAGNOSIS — R627 Adult failure to thrive: Secondary | ICD-10-CM | POA: Diagnosis present

## 2022-07-25 DIAGNOSIS — J189 Pneumonia, unspecified organism: Secondary | ICD-10-CM | POA: Diagnosis present

## 2022-07-25 DIAGNOSIS — E1165 Type 2 diabetes mellitus with hyperglycemia: Secondary | ICD-10-CM | POA: Diagnosis present

## 2022-07-25 DIAGNOSIS — I350 Nonrheumatic aortic (valve) stenosis: Secondary | ICD-10-CM | POA: Diagnosis present

## 2022-07-25 DIAGNOSIS — Z1152 Encounter for screening for COVID-19: Secondary | ICD-10-CM

## 2022-07-25 DIAGNOSIS — Z87891 Personal history of nicotine dependence: Secondary | ICD-10-CM

## 2022-07-25 DIAGNOSIS — Z7189 Other specified counseling: Secondary | ICD-10-CM | POA: Diagnosis not present

## 2022-07-25 DIAGNOSIS — Z79899 Other long term (current) drug therapy: Secondary | ICD-10-CM

## 2022-07-25 DIAGNOSIS — G4733 Obstructive sleep apnea (adult) (pediatric): Secondary | ICD-10-CM | POA: Diagnosis present

## 2022-07-25 DIAGNOSIS — F03A0 Unspecified dementia, mild, without behavioral disturbance, psychotic disturbance, mood disturbance, and anxiety: Secondary | ICD-10-CM | POA: Diagnosis present

## 2022-07-25 DIAGNOSIS — G928 Other toxic encephalopathy: Secondary | ICD-10-CM | POA: Diagnosis not present

## 2022-07-25 DIAGNOSIS — E119 Type 2 diabetes mellitus without complications: Secondary | ICD-10-CM

## 2022-07-25 DIAGNOSIS — N1831 Chronic kidney disease, stage 3a: Secondary | ICD-10-CM | POA: Diagnosis present

## 2022-07-25 DIAGNOSIS — Z886 Allergy status to analgesic agent status: Secondary | ICD-10-CM

## 2022-07-25 DIAGNOSIS — R0609 Other forms of dyspnea: Secondary | ICD-10-CM | POA: Diagnosis not present

## 2022-07-25 DIAGNOSIS — Z23 Encounter for immunization: Secondary | ICD-10-CM | POA: Diagnosis present

## 2022-07-25 DIAGNOSIS — R5381 Other malaise: Secondary | ICD-10-CM | POA: Diagnosis present

## 2022-07-25 DIAGNOSIS — I10 Essential (primary) hypertension: Secondary | ICD-10-CM | POA: Diagnosis not present

## 2022-07-25 DIAGNOSIS — T426X5A Adverse effect of other antiepileptic and sedative-hypnotic drugs, initial encounter: Secondary | ICD-10-CM | POA: Diagnosis not present

## 2022-07-25 DIAGNOSIS — Z66 Do not resuscitate: Secondary | ICD-10-CM | POA: Diagnosis present

## 2022-07-25 DIAGNOSIS — E1151 Type 2 diabetes mellitus with diabetic peripheral angiopathy without gangrene: Secondary | ICD-10-CM | POA: Diagnosis present

## 2022-07-25 DIAGNOSIS — Z820 Family history of epilepsy and other diseases of the nervous system: Secondary | ICD-10-CM

## 2022-07-25 DIAGNOSIS — Z7982 Long term (current) use of aspirin: Secondary | ICD-10-CM

## 2022-07-25 DIAGNOSIS — Z952 Presence of prosthetic heart valve: Secondary | ICD-10-CM

## 2022-07-25 DIAGNOSIS — Z8701 Personal history of pneumonia (recurrent): Secondary | ICD-10-CM

## 2022-07-25 DIAGNOSIS — I447 Left bundle-branch block, unspecified: Secondary | ICD-10-CM | POA: Diagnosis present

## 2022-07-25 DIAGNOSIS — A419 Sepsis, unspecified organism: Principal | ICD-10-CM | POA: Diagnosis present

## 2022-07-25 DIAGNOSIS — Z9989 Dependence on other enabling machines and devices: Secondary | ICD-10-CM

## 2022-07-25 DIAGNOSIS — I2510 Atherosclerotic heart disease of native coronary artery without angina pectoris: Secondary | ICD-10-CM | POA: Diagnosis present

## 2022-07-25 DIAGNOSIS — J9601 Acute respiratory failure with hypoxia: Secondary | ICD-10-CM | POA: Diagnosis not present

## 2022-07-25 DIAGNOSIS — M792 Neuralgia and neuritis, unspecified: Secondary | ICD-10-CM | POA: Diagnosis not present

## 2022-07-25 DIAGNOSIS — Z800 Family history of malignant neoplasm of digestive organs: Secondary | ICD-10-CM

## 2022-07-25 DIAGNOSIS — G4730 Sleep apnea, unspecified: Secondary | ICD-10-CM | POA: Diagnosis present

## 2022-07-25 LAB — COMPREHENSIVE METABOLIC PANEL
ALT: 25 U/L (ref 0–44)
AST: 45 U/L — ABNORMAL HIGH (ref 15–41)
Albumin: 3.1 g/dL — ABNORMAL LOW (ref 3.5–5.0)
Alkaline Phosphatase: 80 U/L (ref 38–126)
Anion gap: 16 — ABNORMAL HIGH (ref 5–15)
BUN: 43 mg/dL — ABNORMAL HIGH (ref 8–23)
CO2: 23 mmol/L (ref 22–32)
Calcium: 9.1 mg/dL (ref 8.9–10.3)
Chloride: 103 mmol/L (ref 98–111)
Creatinine, Ser: 1.96 mg/dL — ABNORMAL HIGH (ref 0.61–1.24)
GFR, Estimated: 32 mL/min — ABNORMAL LOW (ref >60)
Glucose, Bld: 190 mg/dL — ABNORMAL HIGH (ref 70–99)
Potassium: 4.5 mmol/L (ref 3.5–5.1)
Sodium: 142 mmol/L (ref 135–145)
Total Bilirubin: 0.9 mg/dL (ref 0.3–1.2)
Total Protein: 7.1 g/dL (ref 6.5–8.1)

## 2022-07-25 LAB — CK: Total CK: 700 U/L — ABNORMAL HIGH (ref 49–397)

## 2022-07-25 LAB — PROTIME-INR
INR: 1.3 — ABNORMAL HIGH (ref 0.8–1.2)
Prothrombin Time: 15.6 seconds — ABNORMAL HIGH (ref 11.4–15.2)

## 2022-07-25 LAB — CBC
HCT: 36.8 % — ABNORMAL LOW (ref 39.0–52.0)
Hemoglobin: 11.6 g/dL — ABNORMAL LOW (ref 13.0–17.0)
MCH: 32.4 pg (ref 26.0–34.0)
MCHC: 31.5 g/dL (ref 30.0–36.0)
MCV: 102.8 fL — ABNORMAL HIGH (ref 80.0–100.0)
Platelets: 129 10*3/uL — ABNORMAL LOW (ref 150–400)
RBC: 3.58 MIL/uL — ABNORMAL LOW (ref 4.22–5.81)
RDW: 14.3 % (ref 11.5–15.5)
WBC: 7.1 10*3/uL (ref 4.0–10.5)
nRBC: 0 % (ref 0.0–0.2)

## 2022-07-25 LAB — CBC WITH DIFFERENTIAL/PLATELET
Abs Immature Granulocytes: 0.08 10*3/uL — ABNORMAL HIGH (ref 0.00–0.07)
Basophils Absolute: 0 10*3/uL (ref 0.0–0.1)
Basophils Relative: 0 %
Eosinophils Absolute: 0 10*3/uL (ref 0.0–0.5)
Eosinophils Relative: 0 %
HCT: 41 % (ref 39.0–52.0)
Hemoglobin: 12.6 g/dL — ABNORMAL LOW (ref 13.0–17.0)
Immature Granulocytes: 1 %
Lymphocytes Relative: 4 %
Lymphs Abs: 0.4 10*3/uL — ABNORMAL LOW (ref 0.7–4.0)
MCH: 31.7 pg (ref 26.0–34.0)
MCHC: 30.7 g/dL (ref 30.0–36.0)
MCV: 103.3 fL — ABNORMAL HIGH (ref 80.0–100.0)
Monocytes Absolute: 1.1 10*3/uL — ABNORMAL HIGH (ref 0.1–1.0)
Monocytes Relative: 10 %
Neutro Abs: 9.6 10*3/uL — ABNORMAL HIGH (ref 1.7–7.7)
Neutrophils Relative %: 85 %
Platelets: 143 10*3/uL — ABNORMAL LOW (ref 150–400)
RBC: 3.97 MIL/uL — ABNORMAL LOW (ref 4.22–5.81)
RDW: 14.4 % (ref 11.5–15.5)
WBC: 11.2 10*3/uL — ABNORMAL HIGH (ref 4.0–10.5)
nRBC: 0 % (ref 0.0–0.2)

## 2022-07-25 LAB — RESP PANEL BY RT-PCR (RSV, FLU A&B, COVID)  RVPGX2
Influenza A by PCR: NEGATIVE
Influenza B by PCR: NEGATIVE
Resp Syncytial Virus by PCR: NEGATIVE
SARS Coronavirus 2 by RT PCR: NEGATIVE

## 2022-07-25 LAB — CBG MONITORING, ED: Glucose-Capillary: 182 mg/dL — ABNORMAL HIGH (ref 70–99)

## 2022-07-25 LAB — TROPONIN I (HIGH SENSITIVITY)
Troponin I (High Sensitivity): 110 ng/L (ref <18)
Troponin I (High Sensitivity): 289 ng/L (ref <18)

## 2022-07-25 LAB — BRAIN NATRIURETIC PEPTIDE: B Natriuretic Peptide: 211.5 pg/mL — ABNORMAL HIGH (ref 0.0–100.0)

## 2022-07-25 LAB — GLUCOSE, CAPILLARY: Glucose-Capillary: 124 mg/dL — ABNORMAL HIGH (ref 70–99)

## 2022-07-25 LAB — LACTIC ACID, PLASMA
Lactic Acid, Venous: 4.1 mmol/L (ref 0.5–1.9)
Lactic Acid, Venous: 2.6 mmol/L (ref 0.5–1.9)

## 2022-07-25 LAB — APTT: aPTT: 31 seconds (ref 24–36)

## 2022-07-25 LAB — CREATININE, SERUM
Creatinine, Ser: 1.97 mg/dL — ABNORMAL HIGH (ref 0.61–1.24)
GFR, Estimated: 32 mL/min — ABNORMAL LOW (ref >60)

## 2022-07-25 MED ORDER — HYDROCORTISONE 1 % EX CREA: 1.0000 | TOPICAL_CREAM | Freq: Every day | CUTANEOUS | Status: DC | PRN

## 2022-07-25 MED ORDER — NITROGLYCERIN 0.4 MG SL SUBL: 0.4000 mg | SUBLINGUAL_TABLET | SUBLINGUAL | Status: DC | PRN

## 2022-07-25 MED ORDER — FINASTERIDE 5 MG PO TABS
5.0000 mg | ORAL_TABLET | Freq: Every day | ORAL | Status: DC
  Administered 2022-07-26 – 2022-08-04 (×10): 5 mg via ORAL
  Filled 2022-07-25 (×10): qty 1

## 2022-07-25 MED ORDER — SODIUM CHLORIDE 0.9 % IV SOLN: INTRAVENOUS | Status: AC

## 2022-07-25 MED ORDER — ONDANSETRON HCL 4 MG PO TABS: 4.0000 mg | ORAL_TABLET | Freq: Four times a day (QID) | ORAL | Status: DC | PRN

## 2022-07-25 MED ORDER — INSULIN GLARGINE-YFGN 100 UNIT/ML ~~LOC~~ SOLN
32.0000 [IU] | Freq: Every day | SUBCUTANEOUS | Status: DC
  Administered 2022-07-26 – 2022-07-27 (×2): 32 [IU] via SUBCUTANEOUS
  Filled 2022-07-25 (×3): qty 0.32

## 2022-07-25 MED ORDER — SODIUM CHLORIDE 0.9% FLUSH
3.0000 mL | Freq: Two times a day (BID) | INTRAVENOUS | Status: DC
  Administered 2022-07-26 – 2022-08-04 (×10): 3 mL via INTRAVENOUS

## 2022-07-25 MED ORDER — ACETAMINOPHEN 325 MG PO TABS: 650.0000 mg | ORAL_TABLET | Freq: Four times a day (QID) | ORAL | Status: DC | PRN

## 2022-07-25 MED ORDER — DORZOLAMIDE HCL-TIMOLOL MAL 2-0.5 % OP SOLN
1.0000 [drp] | Freq: Two times a day (BID) | OPHTHALMIC | Status: DC
  Administered 2022-07-26 – 2022-08-04 (×16): 1 [drp] via OPHTHALMIC
  Filled 2022-07-25: qty 10

## 2022-07-25 MED ORDER — HEPARIN SODIUM (PORCINE) 5000 UNIT/ML IJ SOLN
5000.0000 [IU] | Freq: Three times a day (TID) | INTRAMUSCULAR | Status: DC
  Administered 2022-07-26 – 2022-08-03 (×24): 5000 [IU] via SUBCUTANEOUS
  Filled 2022-07-25 (×25): qty 1

## 2022-07-25 MED ORDER — INSULIN ASPART 100 UNIT/ML IJ SOLN
0.0000 [IU] | Freq: Every day | INTRAMUSCULAR | Status: DC
  Filled 2022-07-25: qty 0.05

## 2022-07-25 MED ORDER — POLYVINYL ALCOHOL 1.4 % OP SOLN: 1.0000 [drp] | Freq: Every day | OPHTHALMIC | Status: DC | PRN

## 2022-07-25 MED ORDER — ACETAMINOPHEN 650 MG RE SUPP: 650.0000 mg | Freq: Four times a day (QID) | RECTAL | Status: DC | PRN

## 2022-07-25 MED ORDER — AMLODIPINE BESYLATE 5 MG PO TABS
10.0000 mg | ORAL_TABLET | Freq: Every day | ORAL | Status: DC
  Administered 2022-07-26 – 2022-08-04 (×10): 10 mg via ORAL
  Filled 2022-07-25 (×10): qty 2

## 2022-07-25 MED ORDER — SODIUM CHLORIDE 0.9% FLUSH: 3.0000 mL | INTRAVENOUS | Status: DC | PRN

## 2022-07-25 MED ORDER — SODIUM CHLORIDE 0.9 % IV SOLN: 250.0000 mL | INTRAVENOUS | Status: DC | PRN

## 2022-07-25 MED ORDER — ADULT MULTIVITAMIN W/MINERALS CH
1.0000 | ORAL_TABLET | Freq: Every day | ORAL | Status: DC
  Administered 2022-07-26 – 2022-07-29 (×4): 1 via ORAL
  Filled 2022-07-25 (×4): qty 1

## 2022-07-25 MED ORDER — SODIUM CHLORIDE 0.9 % IV BOLUS
500.0000 mL | Freq: Once | INTRAVENOUS | Status: AC
  Administered 2022-07-25: 500 mL via INTRAVENOUS

## 2022-07-25 MED ORDER — OXYCODONE-ACETAMINOPHEN 5-325 MG PO TABS
1.0000 | ORAL_TABLET | Freq: Four times a day (QID) | ORAL | Status: DC | PRN
  Administered 2022-07-26: 2 via ORAL
  Administered 2022-07-26: 1 via ORAL
  Administered 2022-07-28: 2 via ORAL
  Administered 2022-07-28: 1 via ORAL
  Administered 2022-07-29: 2 via ORAL
  Filled 2022-07-25 (×2): qty 2
  Filled 2022-07-25: qty 1
  Filled 2022-07-25 (×2): qty 2

## 2022-07-25 MED ORDER — ONDANSETRON HCL 4 MG/2ML IJ SOLN: 4.0000 mg | Freq: Four times a day (QID) | INTRAMUSCULAR | Status: DC | PRN

## 2022-07-25 MED ORDER — ACETAMINOPHEN 325 MG PO TABS
650.0000 mg | ORAL_TABLET | Freq: Four times a day (QID) | ORAL | Status: DC | PRN
  Administered 2022-07-26: 650 mg via ORAL
  Filled 2022-07-25: qty 2

## 2022-07-25 MED ORDER — SODIUM CHLORIDE 0.9 % IV SOLN
2.0000 g | INTRAVENOUS | Status: DC
  Administered 2022-07-25 – 2022-07-28 (×4): 2 g via INTRAVENOUS
  Filled 2022-07-25 (×4): qty 20

## 2022-07-25 MED ORDER — SODIUM CHLORIDE 0.9 % IV BOLUS (SEPSIS)
500.0000 mL | Freq: Once | INTRAVENOUS | Status: AC
  Administered 2022-07-25: 500 mL via INTRAVENOUS

## 2022-07-25 MED ORDER — GABAPENTIN 300 MG PO CAPS
300.0000 mg | ORAL_CAPSULE | Freq: Three times a day (TID) | ORAL | Status: DC
  Administered 2022-07-26 – 2022-07-29 (×11): 300 mg via ORAL
  Filled 2022-07-25 (×11): qty 1

## 2022-07-25 MED ORDER — HEPARIN SODIUM (PORCINE) 5000 UNIT/ML IJ SOLN: 5000.0000 [IU] | Freq: Three times a day (TID) | INTRAMUSCULAR | Status: DC

## 2022-07-25 MED ORDER — ASPIRIN 81 MG PO CHEW
81.0000 mg | CHEWABLE_TABLET | ORAL | Status: DC
  Administered 2022-07-26 – 2022-08-02 (×3): 81 mg via ORAL
  Filled 2022-07-25 (×5): qty 1

## 2022-07-25 MED ORDER — INSULIN ASPART 100 UNIT/ML IJ SOLN
0.0000 [IU] | Freq: Three times a day (TID) | INTRAMUSCULAR | Status: DC
  Administered 2022-07-26 (×2): 1 [IU] via SUBCUTANEOUS
  Filled 2022-07-25: qty 0.09

## 2022-07-25 MED ORDER — SODIUM CHLORIDE 0.9 % IV SOLN
500.0000 mg | INTRAVENOUS | Status: DC
  Administered 2022-07-25 – 2022-07-27 (×3): 500 mg via INTRAVENOUS
  Filled 2022-07-25 (×4): qty 5

## 2022-07-25 NOTE — ED Provider Notes (Signed)
[facility] [facility] AT [facility] Provider Note   CSN: [id number] Arrival date & time: 07/25/22  1527     History  Chief Complaint  Patient presents with   Weakness   Herpes Zoster    [name] [name] is a 89 y.o. male.  HPI 89 year old male with multiple comorbidities which include CKD, type 2 diabetes, aortic stenosis status post aortic valve replacement in 2020, hypertension presents with generalized weakness, fall.  He has been dealing with severe headache shingles for about a month.  The headache is intractable.  It is not new or worse or different today.  He has been dealing with a cough for about 2 weeks.  Today he slid out of his chair.  He states he could not get up due to weakness/difficulty in both knees which is chronic.  EMS also provides history and notes that the patient fell out of his chair but is unclear how long he was on the ground and the wife who was at the house was a poor historian.  Otherwise, the patient has been feeling short of breath and EMS noted him to have sats of around 88%.  Did not come up with nasal cannula oxygen due to him primarily mouth breathing and so they put him on a low flow rate of nonrebreather. Patient has a diagnosis of dementia but today is alert and oriented to person, place, time, situation.  Home Medications Prior to Admission medications   Medication Sig Start Date End Date Taking? Authorizing Provider  acetaminophen (TYLENOL) 325 MG tablet Take 2 tablets (650 mg total) by mouth every 6 (six) hours as needed for mild pain. 07/05/18   Nani Skillern, PA-C  allopurinol (ZYLOPRIM) 300 MG tablet Take 300 mg by mouth daily.  Patient not taking: Reported on 07/15/2022 05/17/12   [provider]  amLODipine (NORVASC) 10 MG tablet TAKE 1 TABLET (10 MG TOTAL) BY MOUTH DAILY. **MUST SCHEDULE APPOINTMENT FOR FUTURE REFILLS** Patient taking differently: Take 10 mg by mouth daily. 05/26/22   Martinique, Peter M, MD   aspirin 81 MG tablet Take 81 mg by mouth 2 (two) times a week.    [provider]  atorvastatin (LIPITOR) 80 MG tablet TAKE 1 TABLET BY MOUTH DAILY. Patient taking differently: Take 80 mg by mouth once a week. 07/15/21   Martinique, Peter M, MD  carboxymethylcellulose (REFRESH PLUS) 0.5 % SOLN Place 1 drop into both eyes daily as needed (dry eyes).    [provider]  dorzolamide-timolol (COSOPT) 22.3-6.8 MG/ML ophthalmic solution Place 1 drop into the left eye 2 (two) times daily.    [provider]  finasteride (PROSCAR) 5 MG tablet Take 5 mg by mouth daily.    [provider]  gabapentin (NEURONTIN) 100 MG capsule Take 3 capsules (300 mg total) by mouth 3 (three) times daily for 15 days. 07/15/22 07/30/22  Dorothyann Peng, PA-C  hydrocortisone cream 1 % Apply 1 application topically daily as needed for itching.    [provider]  Multiple Vitamin (MULTIVITAMIN WITH MINERALS) TABS tablet Take 1 tablet by mouth daily.    [provider]  nitroGLYCERIN (NITROSTAT) 0.4 MG SL tablet Place 0.4 mg under the tongue every 5 (five) minutes as needed for chest pain. 02/11/18   [provider]  TRESIBA FLEXTOUCH 100 UNIT/ML FlexTouch Pen Inject 32 Units into the skin daily. 06/08/22   [provider]  triamcinolone cream (KENALOG) 0.1 % Apply 1 Application topically 2 (two)  times daily as needed (rash- itching). 04/29/22   [provider]      Allergies    Indocin [indomethacin]    Review of Systems   Review of Systems  Constitutional:  Negative for fever.  Respiratory:  Positive for cough and shortness of breath.   Cardiovascular:  Negative for chest pain.  Gastrointestinal:  Negative for abdominal pain.  Neurological:  Positive for headaches.    Physical Exam Updated Vital Signs BP (!) 114/54   Pulse (!) 104   Temp 98.9 F (37.2 C) (Rectal)   Resp (!) 23   Ht 5' 9"$  (1.753 m)   Wt 99.7 kg   SpO2 98%   BMI 32.46  kg/m  Physical Exam Vitals and nursing note reviewed.  Constitutional:      Appearance: He is well-developed. He is not diaphoretic.  HENT:     Head: Normocephalic and atraumatic.     Comments: Appears to have residual shingles over left forehead/eyelid. No apparent active lesions Cardiovascular:     Rate and Rhythm: Regular rhythm. Tachycardia present.     Heart sounds: Normal heart sounds.  Pulmonary:     Effort: Pulmonary effort is normal. Tachypnea present. No accessory muscle usage.     Breath sounds: Examination of the right-lower field reveals decreased breath sounds. Decreased breath sounds present.  Abdominal:     Palpations: Abdomen is soft.     Tenderness: There is no abdominal tenderness.     Hernia: A hernia (reducible) is present.  Musculoskeletal:     Right lower leg: Edema present.     Left lower leg: Edema present.     Comments: Mild pedal edema, R>L  Skin:    General: Skin is warm and dry.  Neurological:     Mental Status: He is alert and oriented to person, place, and time.     ED Results / Procedures / Treatments   Labs (all labs ordered are listed, but only abnormal results are displayed) Labs Reviewed  BRAIN NATRIURETIC PEPTIDE - Abnormal; Notable for the following components:      Result Value   B Natriuretic Peptide 211.5 (*)    All other components within normal limits  LACTIC ACID, PLASMA - Abnormal; Notable for the following components:   Lactic Acid, Venous 4.1 (*)    All other components within normal limits  LACTIC ACID, PLASMA - Abnormal; Notable for the following components:   Lactic Acid, Venous 2.6 (*)    All other components within normal limits  COMPREHENSIVE METABOLIC PANEL - Abnormal; Notable for the following components:   Glucose, Bld 190 (*)    BUN 43 (*)    Creatinine, Ser 1.96 (*)    Albumin 3.1 (*)    AST 45 (*)    GFR, Estimated 32 (*)    Anion gap 16 (*)    All other components within normal limits  CBC WITH  DIFFERENTIAL/PLATELET - Abnormal; Notable for the following components:   WBC 11.2 (*)    RBC 3.97 (*)    Hemoglobin 12.6 (*)    MCV 103.3 (*)    Platelets 143 (*)    Neutro Abs 9.6 (*)    Lymphs Abs 0.4 (*)    Monocytes Absolute 1.1 (*)    Abs Immature Granulocytes 0.08 (*)    All other components within normal limits  PROTIME-INR - Abnormal; Notable for the following components:   Prothrombin Time 15.6 (*)    INR 1.3 (*)    All  other components within normal limits  CK - Abnormal; Notable for the following components:   Total CK 700 (*)    All other components within normal limits  CBG MONITORING, ED - Abnormal; Notable for the following components:   Glucose-Capillary 182 (*)    All other components within normal limits  TROPONIN I (HIGH SENSITIVITY) - Abnormal; Notable for the following components:   Troponin I (High Sensitivity) 110 (*)    All other components within normal limits  TROPONIN I (HIGH SENSITIVITY) - Abnormal; Notable for the following components:   Troponin I (High Sensitivity) 289 (*)    All other components within normal limits  RESP PANEL BY RT-PCR (RSV, FLU A&B, COVID)  RVPGX2  CULTURE, BLOOD (ROUTINE X 2)  CULTURE, BLOOD (ROUTINE X 2)  APTT  URINALYSIS, W/ REFLEX TO CULTURE (INFECTION SUSPECTED)  CBC  CREATININE, SERUM  COMPREHENSIVE METABOLIC PANEL  CBC  HEMOGLOBIN A1C    EKG EKG Interpretation  Date/Time:  Sunday July 25 2022 15:48:02 EST Ventricular Rate:  115 PR Interval:    QRS Duration: 154 QT Interval:  425 QTC Calculation: 588 R Axis:   -67 Text Interpretation: Sinus tachycardia Left bundle branch block similar to 2020 Confirmed by Sherwood Gambler [phone]) on 07/25/2022 3:52:35 PM  Radiology DG Chest Port 1 View  Result Date: 07/25/2022 CLINICAL DATA:  Possible sepsis.  Cough. EXAM: PORTABLE CHEST 1 VIEW COMPARISON:  None Available. FINDINGS: The cardiomediastinal silhouette is unchanged. New RIGHT LOWER lung airspace opacities  likely represents pneumonia. There is no evidence of pneumothorax or large pleural effusion. No acute bony abnormalities are identified. IMPRESSION: New RIGHT LOWER lung airspace opacities likely representing pneumonia. Radiographic follow-up to resolution is recommended. Electronically Signed   By: Margarette Canada M.D.   On: 07/25/2022 16:31    Procedures .Critical Care  Performed by: Sherwood Gambler, MD Authorized by: Sherwood Gambler, MD   Critical care provider statement:    Critical care time (minutes):  35   Critical care time was exclusive of:  Separately billable procedures and treating other patients   Critical care was necessary to treat or prevent imminent or life-threatening deterioration of the following conditions:  Respiratory failure and sepsis   Critical care was time spent personally by me on the following activities:  Development of treatment plan with patient or surrogate, discussions with consultants, evaluation of patient's response to treatment, examination of patient, ordering and review of laboratory studies, ordering and review of radiographic studies, ordering and performing treatments and interventions, pulse oximetry, re-evaluation of patient's condition and review of old charts     Medications Ordered in ED Medications  cefTRIAXone (ROCEPHIN) 2 g in sodium chloride 0.9 % 100 mL IVPB (0 g Intravenous Stopped 07/25/22 1907)  azithromycin (ZITHROMAX) 500 mg in sodium chloride 0.9 % 250 mL IVPB (0 mg Intravenous Stopped 07/25/22 1907)  sodium chloride flush (NS) 0.9 % injection 3 mL (has no administration in time range)  sodium chloride flush (NS) 0.9 % injection 3 mL (has no administration in time range)  0.9 %  sodium chloride infusion (has no administration in time range)  acetaminophen (TYLENOL) tablet 650 mg (has no administration in time range)    Or  acetaminophen (TYLENOL) suppository 650 mg (has no administration in time range)  ondansetron (ZOFRAN) tablet 4 mg  (has no administration in time range)    Or  ondansetron (ZOFRAN) injection 4 mg (has no administration in time range)  amLODipine (NORVASC) tablet 10 mg (has no administration  in time range)  aspirin tablet 81 mg (has no administration in time range)  carboxymethylcellulose (REFRESH PLUS) 0.5 % ophthalmic solution 1 drop (has no administration in time range)  dorzolamide-timolol (COSOPT) 2-0.5 % ophthalmic solution 1 drop (has no administration in time range)  finasteride (PROSCAR) tablet 5 mg (has no administration in time range)  gabapentin (NEURONTIN) capsule 300 mg (has no administration in time range)  hydrocortisone cream 1 % 1 Application (has no administration in time range)  multivitamin with minerals tablet 1 tablet (has no administration in time range)  nitroGLYCERIN (NITROSTAT) SL tablet 0.4 mg (has no administration in time range)  insulin degludec (TRESIBA) 100 UNIT/ML FlexTouch Pen 32 Units (has no administration in time range)  0.9 %  sodium chloride infusion (has no administration in time range)  oxyCODONE-acetaminophen (PERCOCET/ROXICET) 5-325 MG per tablet 1-2 tablet (has no administration in time range)  heparin injection 5,000 Units (has no administration in time range)  insulin aspart (novoLOG) injection 0-9 Units (has no administration in time range)  insulin aspart (novoLOG) injection 0-5 Units (has no administration in time range)  sodium chloride 0.9 % bolus 500 mL (500 mLs Intravenous New Bag/Given 07/25/22 1651)  sodium chloride 0.9 % bolus 500 mL (0 mLs Intravenous Stopped 07/25/22 1907)    ED Course/ Medical Decision Making/ A&P Clinical Course as of 07/25/22 1917  Sun Jul 25, 2022  1625 Patient has known CHF with wet cough. Afebrile. He is not hypotensive. However, with his tachycardia and normal BP, and now looking at his portable CXR, I think he's more likely Pneumonia rather than CHF causing his respiratory failure. Will give small fluid bolus (EF45% in 2020)  and IV antibiotics. [SG]    Clinical Course User Index [SG] Sherwood Gambler, MD                             Medical Decision Making Amount and/or Complexity of Data Reviewed Independent Historian: EMS Labs: ordered.    Details: Mild leukocytosis.  Mild AKI compared to most recent renal function.  Lactate is 4.1 which has improved after fluids down to 2.6.  Troponin is elevated though probably demand. Radiology: ordered and independent interpretation performed.    Details: Right lower lobe pneumonia ECG/medicine tests: ordered and independent interpretation performed.    Details: Sinus tachycardia  Risk Decision regarding hospitalization.   Patient presents with cough and shortness of breath and weakness.  Found to have pneumonia.  Treated for sepsis.  Chart review shows his most recent EF is around 45%.  While he does have a high lactate, I think this is a combination of sepsis but also hypoxia.  Given this combination I do not think he needs 30 cc/KG of IV fluids.  However he does appear little dry so he will be given some fluids.  He was also given Rocephin and azithromycin.  He will need admission.  He is requiring oxygen but otherwise only mildly tachypneic and protecting his airway.  Discussed with Dr. Louanne Belton for admission. Discussed findings with wife over the phone.         Final Clinical Impression(s) / ED Diagnoses Final diagnoses:  Acute respiratory failure with hypoxia ([place])  Sepsis due to pneumonia [facility])    Rx / DC Orders ED Discharge Orders     None         Sherwood Gambler, MD 07/25/22 1919

## 2022-07-25 NOTE — H&P (Signed)
[facility] History and Physical  [name] [name] [id number] DOB: 1931-08-01 DOA: 07/25/2022  Referring physician: ED  PCP: Donnajean Lopes, MD   Patient is coming from: Home  Chief Complaint: Status post fall,  HPI:  Patient is a 89 years old male with past medical history of hypertension, aortic stenosis status post TAVR, sleep apnea, GERD, diabetes mellitus type 2, CKD stage III, BPH was brought into the hospital after being found on the floor by his wife after slipping from the chair and was on the ground for a while, it was an unwitnessed fall.  Patient has been having cough for a week or so and had been suffering from postherpetic facial pain.  Patient has been on multiple medications for facial pain from herpes but he still continues to suffer and has been trying some cream and ice at home.  Patient does have some trouble while swallowing with cough.  Denies any chest pain, nausea, vomiting, fever, chills or rigor.  Denies any urinary urgency, frequency or dysuria.  Has not been eating much over the last week or so.  In the ED patient was afebrile but tachycardic with  hypoxia on presentation at 88% on room air and was placed on 8 L of nonrebreather mask.    CK level was mildly elevated.  COVID and influenza was negative.  BNP at 211.  Initial lactate was 4.1.  Blood glucose level was elevated.  Patient did have mild leukocytosis at 11.2. X-ray of the chest showed right lower lobe pneumonia.  Blood cultures were sent.  Patient received 501 IV fluid bolus due to borderline low EF and was considered for admission to the hospital for further evaluation and treatment.  Assessment and plan.  Status post fall, debility, deconditioning.  Found on the floor.  Uncertain etiology.  Could be from pneumonia and weakness.  Patient was negative for COVID influenza and RSV.  Continue with Rocephin and Zithromax IV.Marland Kitchen  Follow-up blood cultures.  Will get PT evaluation.  Add incentive  spirometry,  Right lower lobe pneumonia with hypoxia.  Patient had leukocytosis and tachycardia on presentation with hypoxia and right lower lobe pneumonia.  Does have impaired LV function from previous echo so received 500 mL of IV fluid bolus.  Will give a 500 ml more and continue IV fluids overnight.  Check 2D echocardiogram.  Will get a speech therapy evaluation.  Aspiration precautions.  History of aortic stenosis status post TAVR.  Check 2D echocardiogram at this time.  2D echocardiogram from 07/16/2018 showed LV ejection fraction of 45 to 50% with regional wall motion abnormalities.  Possible mild AKI on  CKD stage IIIa.   Continue to monitor creatinine levels.  Creatinine on presentation at 1.9.  Will continue with gentle fluids.  Check creatinine levels in AM.  Diabetes mellitus type 2 with hyperglycemia. Continue sliding scale insulin, fingerstick glucose monitoring, diabetic diet.  Closely monitor.  Will resume [place] from home.  Posterior herpetic neuralgia Patient is on gabapentin 300 mg 3 times a day.  Will resume home.  Will add narcotics for intractable pain.  BPH.  Will continue finasteride from home.   DVT Prophylaxis: Heparin subcu  Review of Systems:  All systems were reviewed and were negative unless otherwise mentioned in the HPI   Past Medical History:  Diagnosis Date   Arthritis of back    BPH (benign prostatic hyperplasia)    CKD (chronic kidney disease) stage 3, GFR 30-59 ml/min (HCC)    Colon polyps  Diabetes mellitus, type 2 ([place])    Diabetic nephropathy ([place])    Diverticulosis    ED (erectile dysfunction)    Gallstones    GERD (gastroesophageal reflux disease)    Glaucoma    Hiatal hernia    History of gout    History of hiatal hernia    Hx of urinary tract infection    Hypercholesterolemia    Hypertension    Left groin hematoma 07/04/2018   Osteoarthritis    left knee   Pneumonia    PVD (peripheral vascular disease) (HCC)    S/P TAVR  (transcatheter aortic valve replacement) 07/04/2018   26 mm Edwards Sapien 3 transcatheter heart valve placed via percutaneous right transfemoral approach    Severe aortic stenosis    Sleep apnea    Sleep apnea, obstructive    Past Surgical History:  Procedure Laterality Date   ABDOMINAL AORTIC ANEURYSM REPAIR  1995   APPENDECTOMY     CATARACT EXTRACTION Bilateral 2003   FEMORAL-POPLITEAL BYPASS GRAFT     left   INTRAOPERATIVE TRANSTHORACIC ECHOCARDIOGRAM  07/04/2018   Procedure: INTRAOPERATIVE TRANSTHORACIC ECHOCARDIOGRAM;  Surgeon: Sherren Mocha, MD;  Location: [facility];  Service: Open Heart Surgery;;   LUMBAR SPINE SURGERY  03/2001   REPLACEMENT TOTAL KNEE Bilateral 2004   RIGHT/LEFT HEART CATH AND CORONARY ANGIOGRAPHY N/A 06/13/2018   Procedure: RIGHT/LEFT HEART CATH AND CORONARY ANGIOGRAPHY;  Surgeon: Martinique, Peter M, MD;  Location: [facility];  Service: Cardiovascular;  Laterality: N/A;   TEE WITHOUT CARDIOVERSION  07/04/2018   Procedure: TRANSESOPHAGEAL ECHOCARDIOGRAM (TEE);  Surgeon: Sherren Mocha, MD;  Location: [facility];  Service: Open Heart Surgery;;   TOTAL KNEE ARTHROPLASTY     bilateral   TRANSCATHETER AORTIC VALVE REPLACEMENT, TRANSFEMORAL  07/04/2018   TRANSCATHETER AORTIC VALVE REPLACEMENT, TRANSFEMORAL N/A 07/04/2018   Procedure: TRANSCATHETER AORTIC VALVE REPLACEMENT, TRANSFEMORAL;  Surgeon: Sherren Mocha, MD;  Location: [facility];  Service: Open Heart Surgery;  Laterality: N/A;   VASECTOMY      Social History:  reports that he quit smoking about 37 years ago. His smoking use included cigarettes. He has a 60.00 pack-year smoking history. He has never used smokeless tobacco. He reports current alcohol use. He reports that he does not use drugs.  Allergies  Allergen Reactions   Indocin [Indomethacin] Other (See Comments)    Antiinflammatory medication for gout ? Indocin > black out per patient ? SYNCOPE ?    Family History  Problem Relation Age of Onset    Heart attack Mother    Alzheimer's disease Father    Pancreatic cancer Sister      Prior to Admission medications   Medication Sig Start Date End Date Taking? Authorizing Provider  acetaminophen (TYLENOL) 325 MG tablet Take 2 tablets (650 mg total) by mouth every 6 (six) hours as needed for mild pain. 07/05/18   Nani Skillern, PA-C  allopurinol (ZYLOPRIM) 300 MG tablet Take 300 mg by mouth daily.  Patient not taking: Reported on 07/15/2022 05/17/12   [provider]  amLODipine (NORVASC) 10 MG tablet TAKE 1 TABLET (10 MG TOTAL) BY MOUTH DAILY. MUST SCHEDULE APPOINTMENT FOR FUTURE REFILLS Patient taking differently: Take 10 mg by mouth daily. 05/26/22   Martinique, Peter M, MD  aspirin 81 MG tablet Take 81 mg by mouth 2 (two) times a week.    [provider]  atorvastatin (LIPITOR) 80 MG tablet TAKE 1 TABLET BY MOUTH DAILY. Patient taking differently: Take 80 mg by mouth once  a week. 07/15/21   Martinique, Peter M, MD  carboxymethylcellulose (REFRESH PLUS) 0.5 % SOLN Place 1 drop into both eyes daily as needed (dry eyes).    [provider]  dorzolamide-timolol (COSOPT) 22.3-6.8 MG/ML ophthalmic solution Place 1 drop into the left eye 2 (two) times daily.    [provider]  finasteride (PROSCAR) 5 MG tablet Take 5 mg by mouth daily.    [provider]  gabapentin (NEURONTIN) 100 MG capsule Take 3 capsules (300 mg total) by mouth 3 (three) times daily for 15 days. 07/15/22 07/30/22  Dorothyann Peng, PA-C  hydrocortisone cream 1 % Apply 1 application topically daily as needed for itching.    [provider]  Multiple Vitamin (MULTIVITAMIN WITH MINERALS) TABS tablet Take 1 tablet by mouth daily.    [provider]  nitroGLYCERIN (NITROSTAT) 0.4 MG SL tablet Place 0.4 mg under the tongue every 5 (five) minutes as needed for chest pain. 02/11/18   [provider]  TRESIBA FLEXTOUCH 100 UNIT/ML FlexTouch Pen Inject 32 Units into the skin  daily. 06/08/22   [provider]  triamcinolone cream (KENALOG) 0.1 % Apply 1 Application topically 2 (two) times daily as needed (rash- itching). 04/29/22   [provider]    Physical Exam: Vitals:   07/25/22 1545 07/25/22 1546 07/25/22 1553 07/25/22 1705  BP: 110/79   126/71  Pulse: (!) 121   (!) 110  Resp: (!) 22   17  Temp:   98.9 F (37.2 C)   TempSrc:   Rectal   SpO2: 97%   95%  Weight:  99.7 kg    Height:  5' 9"$  (1.753 m)     Wt Readings from Last 3 Encounters:  07/25/22 99.7 kg  07/15/22 99.8 kg  08/14/20 104.1 kg   Body mass index is 32.46 kg/m.  General:  Average built,  elderly male, obese, appears to be in mild distress due to pain, on nonrebreather mask HENT: Normocephalic, No scleral pallor or icterus noted. Oral mucosa is moist.  Left periorbital area with some erythema. Chest: Diminished breath sounds bilaterally.  Coarse breath sounds.   CVS: S1 &S2 heard. No murmur.  Regular rate and rhythm. Abdomen: Soft, nontender, nondistended.  Bowel sounds are heard.  Abdominal hernia noted.  Extremities: No cyanosis, clubbing or edema.  Peripheral pulses are palpable. Psych: Alert, awake and oriented, anxious mood CNS: Moves all extremities.  Generalized weakness noted Skin: Warm and dry.    Labs on Admission:   CBC: Recent Labs  Lab 07/25/22 1549  WBC 11.2*  NEUTROABS 9.6*  HGB 12.6*  HCT 41.0  MCV 103.3*  PLT 143*    Basic Metabolic Panel: Recent Labs  Lab 07/25/22 1549  NA 142  K 4.5  CL 103  CO2 23  GLUCOSE 190*  BUN 43*  CREATININE 1.96*  CALCIUM 9.1    Liver Function Tests: Recent Labs  Lab 07/25/22 1549  AST 45*  ALT 25  ALKPHOS 80  BILITOT 0.9  PROT 7.1  ALBUMIN 3.1*   No results for input(s): "LIPASE", "AMYLASE" in the last 168 hours. No results for input(s): "AMMONIA" in the last 168 hours.  Cardiac Enzymes: Recent Labs  Lab 07/25/22 1630  CKTOTAL 700*    BNP (last 3 results) Recent Labs     07/25/22 1549  BNP 211.5*    ProBNP (last 3 results) No results for input(s): "PROBNP" in the last 8760 hours.  CBG: Recent Labs  Lab 07/25/22  1559  GLUCAP 182*    Lipase  No results found for: "LIPASE"   Urinalysis    Component Value Date/Time   COLORURINE YELLOW 07/04/2018 0900   APPEARANCEUR CLEAR 07/04/2018 0900   LABSPEC 1.015 07/04/2018 0900   PHURINE 5.0 07/04/2018 0900   GLUCOSEU NEGATIVE 07/04/2018 0900   HGBUR MODERATE (A) 07/04/2018 0900   BILIRUBINUR NEGATIVE 07/04/2018 0900   KETONESUR NEGATIVE 07/04/2018 0900   PROTEINUR 100 (A) 07/04/2018 0900   UROBILINOGEN 1.0 08/07/2008 0538   NITRITE NEGATIVE 07/04/2018 0900   LEUKOCYTESUR NEGATIVE 07/04/2018 0900     Drugs of Abuse  No results found for: "LABOPIA", "COCAINSCRNUR", "LABBENZ", "AMPHETMU", "THCU", "LABBARB"    Radiological Exams on Admission: DG Chest Port 1 View  Result Date: 07/25/2022 CLINICAL DATA:  Possible sepsis.  Cough. EXAM: PORTABLE CHEST 1 VIEW COMPARISON:  None Available. FINDINGS: The cardiomediastinal silhouette is unchanged. New RIGHT LOWER lung airspace opacities likely represents pneumonia. There is no evidence of pneumothorax or large pleural effusion. No acute bony abnormalities are identified. IMPRESSION: New RIGHT LOWER lung airspace opacities likely representing pneumonia. Radiographic follow-up to resolution is recommended. Electronically Signed   By: Margarette Canada M.D.   On: 07/25/2022 16:31    EKG: Not available for review.   Consultant: None  Code Status: DNR  Microbiology blood culture sent from the ED  Antibiotics: Rocephin and Zithromax  Family Communication:  Patients' condition and plan of care including tests being ordered have been discussed with the patient and the patient's daughter at bedside who indicate understanding and agree with the plan.   Status is: Inpatient Remains inpatient appropriate because: Acute hypoxia, right lower lobe pneumonia, need for IV  antibiotics, closer monitoring,   Severity of Illness: The appropriate patient status for this patient is INPATIENT. Inpatient status is judged to be reasonable and necessary in order to provide the required intensity of service to ensure the patient's safety. The patient's presenting symptoms, physical exam findings, and initial radiographic and laboratory data in the context of their chronic comorbidities is felt to place them at high risk for further clinical deterioration. Furthermore, it is not anticipated that the patient will be medically stable for discharge from the hospital within 2 midnights of admission.   I certify that at the point of admission it is my clinical judgment that the patient will require inpatient hospital care spanning beyond 2 midnights from the point of admission due to high intensity of service, high risk for further deterioration and high frequency of surveillance required.*  Signed, Flora Lipps, MD [facility] 07/25/2022

## 2022-07-25 NOTE — ED Triage Notes (Signed)
Pt BIBA from home. Pt had unwitnessed fall and was on ground for unknown amt of time. Wife found him. Pt c/o weakness for past several days. Pt also c/o severe shingle pain, and couch  Satting 88 on RA on EMS arrival. Placed on 8L simple mask  BP: 130/70 HR: 120 CBG: 250

## 2022-07-25 NOTE — Hospital Course (Signed)
Patient is a 89 years old male with past medical history of hypertension, aortic stenosis status post TAVR, sleep apnea, GERD, diabetes mellitus type 2, CKD stage III, BPH was brought into the hospital after being found on the floor by his wife after slipping from the chair and was on the ground for a while, it was an unwitnessed fall..  Patient has been having cough for a week or so and had been suffering from postherpetic facial pain.  In the ED patient was afebrile but tachycardic with  hypoxia on presentation at 88% on room air and was placed on 8 L of nonrebreather mask.    CK level was mildly elevated.  COVID and influenza was negative.  BNP at 211.  Initial lactate was 4.1.  Blood glucose level was elevated.  Patient did have mild leukocytosis at 11.2.X-ray of the chest showed right lower lobe pneumonia.  Blood cultures were sent.  Patient received 501 IV fluid bolus due to borderline low EF and was considered for admission to the hospital for further evaluation and treatment.  Assessment and plan.  Status post fall, debility, deconditioning.  Found on the floor.  Uncertain etiology.  Could be from pneumonia and weakness.  Patient was negative for COVID influenza and RSV.  Continue with Rocephin and Zithromax IV.Marland Kitchen  Follow-up blood cultures.  Will get PT evaluation.  Add incentive spirometry,  Right lower lobe pneumonia with hypoxia.  Patient had leukocytosis and tachycardia on presentation with hypoxia and right lower lobe pneumonia.  Does have impaired LV function from previous echo so received 500 mL of IV fluid bolus.  Will give a 500 mm more and continue IV fluids overnight.  Check 2D echocardiogram.  History of aortic stenosis status post TAVR.  Check 2D echocardiogram at this time.  2D echocardiogram from 07/16/2018 showed LV ejection fraction of 45 to 50% with regional wall motion abnormalities.  History of sleep apnea.   Possible mild AKI on  CKD stage IIIa.   Continue to monitor  creatinine levels.  Creatinine on presentation at 1.9.  Will continue with gentle fluids.  Check creatinine levels in AM.  Diabetes mellitus type 2 with hyperglycemia. Continue sliding scale insulin, fingerstick glucose monitoring, diabetic diet.  Closely monitor.  Posterior herpetic neuralgia Continue medications per home test

## 2022-07-25 NOTE — ED Notes (Signed)
ED TO INPATIENT HANDOFF REPORT  ED Nurse Name and Phone #: Clarise Cruz [phone]  S Name/Age/Gender [name] [name] 89 y.o. male Room/Bed: WA18/WA18  Code Status   Code Status: Full Code  Home/SNF/Other  Patient oriented to: self, place, time, and situation Is this baseline? Yes   Triage Complete: Triage complete  Chief Complaint Right lower lobe pneumonia [J18.9]  Triage Note Pt BIBA from home. Pt had unwitnessed fall and was on ground for unknown amt of time. Wife found him. Pt c/o weakness for past several days. Pt also c/o severe shingle pain, and couch  Satting 88 on RA on EMS arrival. Placed on 8L simple mask  BP: 130/70 HR: 120 CBG: 250     Allergies Allergies  Allergen Reactions   Indocin [Indomethacin] Other (See Comments)    Antiinflammatory medication for gout ? Indocin > black out per patient ? SYNCOPE ?    Level of Care/Admitting Diagnosis ED Disposition     ED Disposition  Admit   Condition  --   Comment  Hospital Area: [facility] [[id number]]  Level of Care: Progressive [102]  Admit to Progressive based on following criteria: RESPIRATORY PROBLEMS hypoxemic/hypercapnic respiratory failure that is responsive to NIPPV (BiPAP) or High Flow Nasal Cannula (6-80 lpm). Frequent assessment/intervention, no > Q2 hrs < Q4 hrs, to maintain oxygenation and pulmonary hygiene.  May [facility] patient to [facility] if equivalent level of care is available:: No  Covid Evaluation: Symptomatic Person Under Investigation (PUI) or recent exposure (last 10 days) *Testing Required*  Diagnosis: Right lower lobe pneumonia [id number]  Admitting Physician: Flora Lipps [id number]  Attending Physician: Flora Lipps [id number]  Certification:: I certify this patient will need inpatient services for at least 2 midnights  Estimated Length of Stay: 2          B Medical/Surgery History Past Medical History:  Diagnosis Date   Arthritis of back    BPH  (benign prostatic hyperplasia)    CKD (chronic kidney disease) stage 3, GFR 30-59 ml/min ([place])    Colon polyps    Diabetes mellitus, type 2 ([place])    Diabetic nephropathy ([place])    Diverticulosis    ED (erectile dysfunction)    Gallstones    GERD (gastroesophageal reflux disease)    Glaucoma    Hiatal hernia    History of gout    History of hiatal hernia    Hx of urinary tract infection    Hypercholesterolemia    Hypertension    Left groin hematoma 07/04/2018   Osteoarthritis    left knee   Pneumonia    PVD (peripheral vascular disease) ([place])    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   26 mm Edwards Sapien 3 transcatheter heart valve placed via percutaneous right transfemoral approach    Severe aortic stenosis    Sleep apnea    Sleep apnea, obstructive    Past Surgical History:  Procedure Laterality Date   ABDOMINAL AORTIC ANEURYSM REPAIR  1995   APPENDECTOMY     CATARACT EXTRACTION Bilateral 2003   FEMORAL-POPLITEAL BYPASS GRAFT     left   INTRAOPERATIVE TRANSTHORACIC ECHOCARDIOGRAM  07/04/2018   Procedure: INTRAOPERATIVE TRANSTHORACIC ECHOCARDIOGRAM;  Surgeon: Sherren Mocha, MD;  Location: [facility];  Service: Open Heart Surgery;;   LUMBAR SPINE SURGERY  03/2001   REPLACEMENT TOTAL KNEE Bilateral 2004   RIGHT/LEFT HEART CATH AND CORONARY ANGIOGRAPHY N/A 06/13/2018   Procedure: RIGHT/LEFT HEART CATH AND CORONARY ANGIOGRAPHY;  Surgeon: Martin[place],  Ander Slade, MD;  Location: [facility];  Service: Cardiovascular;  Laterality: N/A;   TEE WITHOUT CARDIOVERSION  07/04/2018   Procedure: TRANSESOPHAGEAL ECHOCARDIOGRAM (TEE);  Surgeon: Sherren Mocha, MD;  Location: [facility];  Service: Open Heart Surgery;;   TOTAL KNEE ARTHROPLASTY     bilateral   TRANSCATHETER AORTIC VALVE REPLACEMENT, TRANSFEMORAL  07/04/2018   TRANSCATHETER AORTIC VALVE REPLACEMENT, TRANSFEMORAL N/A 07/04/2018   Procedure: TRANSCATHETER AORTIC VALVE REPLACEMENT, TRANSFEMORAL;  Surgeon: Sherren Mocha, MD;  Location: [facility];  Service: Open Heart Surgery;  Laterality: N/A;   VASECTOMY       A IV Location/Drains/Wounds Patient Lines/Drains/Airways Status     Active Line/Drains/Airways     Name Placement date Placement time Site Days   Peripheral IV 07/25/22 20 G Anterior;Distal;Right Forearm 07/25/22  1541  Forearm  less than 1   Peripheral IV 07/25/22 22 G 1" Left;Posterior Hand 07/25/22  1610  Hand  less than 1            Intake/Output Last 24 hours No intake or output data in the 24 hours ending 07/25/22 1806  Labs/Imaging Results for orders placed or performed during the hospital encounter of 07/25/22 (from the past 48 hour(s))  Troponin I (High Sensitivity)     Status: Abnormal   Collection Time: 07/25/22  3:49 PM  Result Value Ref Range   Troponin I (High Sensitivity) 110 (HH) <18 ng/L    Comment: CRITICAL RESULT CALLED TO, READ BACK BY AND VERIFIED WITH SAVOIE,B RN AT 1728 07/25/22 MULLINS,T (NOTE) Elevated high sensitivity troponin I (hsTnI) values and significant  changes across serial measurements may suggest ACS but many other  chronic and acute conditions are known to elevate hsTnI results.  Refer to the "Links" section for chest pain algorithms and additional  guidance. Performed at [facility], [place] [address], [place], [place] [address]   Brain natriuretic peptide     Status: Abnormal   Collection Time: 07/25/22  3:49 PM  Result Value Ref Range   B Natriuretic Peptide 211.5 (H) 0.0 - 100.0 pg/mL    Comment: Performed at [facility], [place] [address] [place], [place] [address]  Lactic acid, plasma     Status: Abnormal   Collection Time: 07/25/22  3:49 PM  Result Value Ref Range   Lactic Acid, Venous 4.1 (HH) 0.5 - 1.9 mmol/L    Comment: CRITICAL RESULT CALLED TO, READ BACK BY AND VERIFIED WITH Darothy Courtright,S. RN AT 1702 07/25/22 MULLINS,T Performed at [facility], [place] [address],  [place], [place] [address]   Comprehensive metabolic panel     Status: Abnormal   Collection Time: 07/25/22  3:49 PM  Result Value Ref Range   Sodium 142 135 - 145 mmol/L   Potassium 4.5 3.5 - 5.1 mmol/L   Chloride 103 98 - 111 mmol/L   CO2 23 22 - 32 mmol/L   Glucose, Bld 190 (H) 70 - 99 mg/dL    Comment: Glucose reference range applies only to samples taken after fasting for at least 8 hours.   BUN 43 (H) 8 - 23 mg/dL   Creatinine, Ser 1.96 (H) 0.61 - 1.24 mg/dL   Calcium 9.1 8.9 - 10.3 mg/dL   Total Protein 7.1 6.5 - 8.1 g/dL   Albumin 3.1 (L) 3.5 - 5.0 g/dL   AST 45 (H) 15 - 41 U/L   ALT 25 0 - 44 U/L   Alkaline Phosphatase 80 38 - 126 U/L  Total Bilirubin 0.9 0.3 - 1.2 mg/dL   GFR, Estimated 32 (L) >60 mL/min    Comment: (NOTE) Calculated using the CKD-EPI Creatinine Equation (2021)    Anion gap 16 (H) 5 - 15    Comment: Performed at [facility], [place] [address], [place], [place] [address]  CBC with Differential     Status: Abnormal   Collection Time: 07/25/22  3:49 PM  Result Value Ref Range   WBC 11.2 (H) 4.0 - 10.5 K/uL   RBC 3.97 (L) 4.22 - 5.81 MIL/uL   Hemoglobin 12.6 (L) 13.0 - 17.0 g/dL   HCT 41.0 39.0 - 52.0 %   MCV 103.3 (H) 80.0 - 100.0 fL   MCH 31.7 26.0 - 34.0 pg   MCHC 30.7 30.0 - 36.0 g/dL   RDW 14.4 11.5 - 15.5 %   Platelets 143 (L) 150 - 400 K/uL   nRBC 0.0 0.0 - 0.2 %   Neutrophils Relative % 85 %   Neutro Abs 9.6 (H) 1.7 - 7.7 K/uL   Lymphocytes Relative 4 %   Lymphs Abs 0.4 (L) 0.7 - 4.0 K/uL   Monocytes Relative 10 %   Monocytes Absolute 1.1 (H) 0.1 - 1.0 K/uL   Eosinophils Relative 0 %   Eosinophils Absolute 0.0 0.0 - 0.5 K/uL   Basophils Relative 0 %   Basophils Absolute 0.0 0.0 - 0.1 K/uL   Immature Granulocytes 1 %   Abs Immature Granulocytes 0.08 (H) 0.00 - 0.07 K/uL    Comment: Performed at [facility], [place] [address] [place], [place] [address]  Protime-INR     Status: Abnormal   Collection Time:  07/25/22  3:49 PM  Result Value Ref Range   Prothrombin Time 15.6 (H) 11.4 - 15.2 seconds   INR 1.3 (H) 0.8 - 1.2    Comment: (NOTE) INR goal varies based on device and disease states. Performed at [facility], [place] [address] [place], [place] [address]   APTT     Status: None   Collection Time: 07/25/22  3:49 PM  Result Value Ref Range   aPTT 31 24 - 36 seconds    Comment: Performed at [facility], [place] [address] [place], [place] [address]  Resp panel by RT-PCR (RSV, Flu A&B, Covid) Anterior Nasal Swab     Status: None   Collection Time: 07/25/22  3:50 PM   Specimen: Anterior Nasal Swab  Result Value Ref Range   SARS Coronavirus 2 by RT PCR NEGATIVE NEGATIVE    Comment: (NOTE) SARS-CoV-2 target nucleic acids are NOT DETECTED.  The SARS-CoV-2 RNA is generally detectable in upper respiratory specimens during the acute phase of infection. The lowest concentration of SARS-CoV-2 viral copies this assay can detect is 138 copies/mL. A negative result does not preclude SARS-Cov-2 infection and should not be used as the sole basis for treatment or other patient management decisions. A negative result may occur with  improper specimen collection/handling, submission of specimen other than nasopharyngeal swab, presence of viral mutation(s) within the areas targeted by this assay, and inadequate number of viral copies(<138 copies/mL). A negative result must be combined with clinical observations, patient history, and epidemiological information. The expected result is Negative.  Fact Sheet for Patients:  EntrepreneurPulse.com.au  Fact Sheet for Healthcare Providers:  IncredibleEmployment.be  This test is no t yet approved or cleared by the [place] FDA and  has been authorized for detection and/or diagnosis of SARS-CoV-2 by FDA  under an Emergency Use Authorization (EUA). This EUA will remain  in effect  (meaning this test can be used) for the duration of the COVID-19 declaration under Section 564(b)(1) of the Act, 21 U.S.C.section 360bbb-3(b)(1), unless the authorization is terminated  or revoked sooner.       Influenza A by PCR NEGATIVE NEGATIVE   Influenza B by PCR NEGATIVE NEGATIVE    Comment: (NOTE) The Xpert Xpress SARS-CoV-2/FLU/RSV plus assay is intended as an aid in the diagnosis of influenza from Nasopharyngeal swab specimens and should not be used as a sole basis for treatment. Nasal washings and aspirates are unacceptable for Xpert Xpress SARS-CoV-2/FLU/RSV testing.  Fact Sheet for Patients: EntrepreneurPulse.com.au  Fact Sheet for Healthcare Providers: IncredibleEmployment.be  This test is not yet approved or cleared by the [place] FDA and has been authorized for detection and/or diagnosis of SARS-CoV-2 by FDA under an Emergency Use Authorization (EUA). This EUA will remain in effect (meaning this test can be used) for the duration of the COVID-19 declaration under Section 564(b)(1) of the Act, 21 U.S.C. section 360bbb-3(b)(1), unless the authorization is terminated or revoked.     Resp Syncytial Virus by PCR NEGATIVE NEGATIVE    Comment: (NOTE) Fact Sheet for Patients: EntrepreneurPulse.com.au  Fact Sheet for Healthcare Providers: IncredibleEmployment.be  This test is not yet approved or cleared by the [place] FDA and has been authorized for detection and/or diagnosis of SARS-CoV-2 by FDA under an Emergency Use Authorization (EUA). This EUA will remain in effect (meaning this test can be used) for the duration of the COVID-19 declaration under Section 564(b)(1) of the Act, 21 U.S.C. section 360bbb-3(b)(1), unless the authorization is terminated or revoked.  Performed at [facility], [place] [address] [place], [place] [address]   CBG monitoring, ED      Status: Abnormal   Collection Time: 07/25/22  3:59 PM  Result Value Ref Range   Glucose-Capillary 182 (H) 70 - 99 mg/dL    Comment: Glucose reference range applies only to samples taken after fasting for at least 8 hours.  CK     Status: Abnormal   Collection Time: 07/25/22  4:30 PM  Result Value Ref Range   Total CK 700 (H) 49 - 397 U/L    Comment: Performed at [facility], [place] [address] [place], [place] [address]   DG Chest Port 1 View  Result Date: 07/25/2022 CLINICAL DATA:  Possible sepsis.  Cough. EXAM: PORTABLE CHEST 1 VIEW COMPARISON:  None Available. FINDINGS: The cardiomediastinal silhouette is unchanged. New RIGHT LOWER lung airspace opacities likely represents pneumonia. There is no evidence of pneumothorax or large pleural effusion. No acute bony abnormalities are identified. IMPRESSION: New RIGHT LOWER lung airspace opacities likely representing pneumonia. Radiographic follow-up to resolution is recommended. Electronically Signed   By: Margarette Canada M.D.   On: 07/25/2022 16:31    Pending Labs Unresulted Labs (From admission, onward)     Start     Ordered   07/25/22 1549  Lactic acid, plasma  (Undifferentiated presentation (screening labs and basic nursing orders))  STAT Now then every 2 hours,   R      07/25/22 1549   07/25/22 1549  Blood Culture (routine x 2)  (Undifferentiated presentation (screening labs and basic nursing orders))  BLOOD CULTURE X 2,   STAT      07/25/22 1549   07/25/22 1549  Urinalysis, w/ Reflex to Culture (Infection Suspected) -Urine, Clean Catch  (Undifferentiated presentation (screening labs  and basic nursing orders))  Once,   URGENT       Question:  Specimen Source  Answer:  Urine, Clean Catch   07/25/22 1549   Signed and Held  CBC  (heparin)  Once,   R       Comments: Baseline for heparin therapy IF NOT ALREADY DRAWN.  Notify MD if PLT < 100 K.    Signed and Held   Signed and Held  Creatinine, serum  (heparin)  Once,   R        Comments: Baseline for heparin therapy IF NOT ALREADY DRAWN.    Signed and Held   Signed and Held  Comprehensive metabolic panel  Tomorrow morning,   R        Signed and Held   Signed and Held  CBC  Tomorrow morning,   R        Signed and Held            Vitals/Pain Today's Vitals   07/25/22 1545 07/25/22 1546 07/25/22 1553 07/25/22 1705  BP: 110/79   126/71  Pulse: (!) 121   (!) 110  Resp: (!) 22   17  Temp:   98.9 F (37.2 C)   TempSrc:   Rectal   SpO2: 97%   95%  Weight:  99.7 kg    Height:  5' 9"$  (1.753 m)    PainSc:  10-Worst pain ever      Isolation Precautions No active isolations  Medications Medications  cefTRIAXone (ROCEPHIN) 2 g in sodium chloride 0.9 % 100 mL IVPB (2 g Intravenous New Bag/Given 07/25/22 1642)  azithromycin (ZITHROMAX) 500 mg in sodium chloride 0.9 % 250 mL IVPB (500 mg Intravenous New Bag/Given 07/25/22 1646)  sodium chloride 0.9 % bolus 500 mL (has no administration in time range)  sodium chloride 0.9 % bolus 500 mL (500 mLs Intravenous New Bag/Given 07/25/22 1651)    Mobility walks with person assist     Focused Assessments    R Recommendations: See Admitting Provider Note  Report given to:   Additional Notes:

## 2022-07-25 NOTE — Progress Notes (Signed)
Placed pt on 55% venti mask. Suctioned copious amounts of tan thick secretions with oral suction. Spo2 95%

## 2022-07-26 ENCOUNTER — Encounter (HOSPITAL_COMMUNITY): Payer: Self-pay | Admitting: Internal Medicine

## 2022-07-26 DIAGNOSIS — I10 Essential (primary) hypertension: Secondary | ICD-10-CM | POA: Diagnosis not present

## 2022-07-26 DIAGNOSIS — J189 Pneumonia, unspecified organism: Secondary | ICD-10-CM | POA: Diagnosis not present

## 2022-07-26 DIAGNOSIS — W19XXXA Unspecified fall, initial encounter: Secondary | ICD-10-CM | POA: Diagnosis not present

## 2022-07-26 DIAGNOSIS — N179 Acute kidney failure, unspecified: Secondary | ICD-10-CM | POA: Diagnosis not present

## 2022-07-26 LAB — COMPREHENSIVE METABOLIC PANEL
ALT: 36 U/L (ref 0–44)
AST: 79 U/L — ABNORMAL HIGH (ref 15–41)
Albumin: 2.6 g/dL — ABNORMAL LOW (ref 3.5–5.0)
Alkaline Phosphatase: 69 U/L (ref 38–126)
Anion gap: 9 (ref 5–15)
BUN: 50 mg/dL — ABNORMAL HIGH (ref 8–23)
CO2: 23 mmol/L (ref 22–32)
Calcium: 8.4 mg/dL — ABNORMAL LOW (ref 8.9–10.3)
Chloride: 109 mmol/L (ref 98–111)
Creatinine, Ser: 1.88 mg/dL — ABNORMAL HIGH (ref 0.61–1.24)
GFR, Estimated: 34 mL/min — ABNORMAL LOW (ref >60)
Glucose, Bld: 129 mg/dL — ABNORMAL HIGH (ref 70–99)
Potassium: 4.5 mmol/L (ref 3.5–5.1)
Sodium: 141 mmol/L (ref 135–145)
Total Bilirubin: 0.8 mg/dL (ref 0.3–1.2)
Total Protein: 5.9 g/dL — ABNORMAL LOW (ref 6.5–8.1)

## 2022-07-26 LAB — CBC
HCT: 35.7 % — ABNORMAL LOW (ref 39.0–52.0)
Hemoglobin: 11 g/dL — ABNORMAL LOW (ref 13.0–17.0)
MCH: 32.3 pg (ref 26.0–34.0)
MCHC: 30.8 g/dL (ref 30.0–36.0)
MCV: 104.7 fL — ABNORMAL HIGH (ref 80.0–100.0)
Platelets: 129 10*3/uL — ABNORMAL LOW (ref 150–400)
RBC: 3.41 MIL/uL — ABNORMAL LOW (ref 4.22–5.81)
RDW: 14.4 % (ref 11.5–15.5)
WBC: 6.9 10*3/uL (ref 4.0–10.5)
nRBC: 0 % (ref 0.0–0.2)

## 2022-07-26 LAB — HEMOGLOBIN A1C
Hgb A1c MFr Bld: 5.2 % (ref 4.8–5.6)
Mean Plasma Glucose: 102.54 mg/dL

## 2022-07-26 LAB — GLUCOSE, CAPILLARY
Glucose-Capillary: 102 mg/dL — ABNORMAL HIGH (ref 70–99)
Glucose-Capillary: 150 mg/dL — ABNORMAL HIGH (ref 70–99)
Glucose-Capillary: 141 mg/dL — ABNORMAL HIGH (ref 70–99)
Glucose-Capillary: 97 mg/dL (ref 70–99)

## 2022-07-26 MED ORDER — ALBUTEROL SULFATE (2.5 MG/3ML) 0.083% IN NEBU: 2.5000 mg | INHALATION_SOLUTION | RESPIRATORY_TRACT | Status: DC | PRN

## 2022-07-26 MED ORDER — ALBUTEROL SULFATE HFA 108 (90 BASE) MCG/ACT IN AERS: 2.0000 | INHALATION_SPRAY | RESPIRATORY_TRACT | Status: DC | PRN

## 2022-07-26 MED ORDER — SODIUM CHLORIDE 0.9 % IV SOLN: INTRAVENOUS | Status: DC

## 2022-07-26 NOTE — Progress Notes (Addendum)
PROGRESS NOTE    [name] [name]  [id number] DOB: 1932-05-19 DOA: 07/25/2022 PCP: Donnajean Lopes, MD    Brief Narrative:  Patient is a 89 years old male with past medical history of hypertension, aortic stenosis status post TAVR, sleep apnea, GERD, diabetes mellitus type 2, CKD stage III, BPH was brought into the hospital after being found on the floor by his wife after slipping from the chair and was on the ground for a while, it was an unwitnessed fall.  Patient has been having cough for a week or so and had been suffering from postherpetic facial pain.  In the ED, patient was afebrile but tachycardic with  hypoxia on presentation at 88% on room air and was placed on 8 L of nonrebreather mask.    CK level was mildly elevated.  COVID and influenza was negative.  BNP at 211.  Initial lactate was 4.1.  Blood glucose level was elevated.  Patient did have mild leukocytosis at 11.2. X-ray of the chest showed right lower lobe pneumonia.  Blood cultures were sent.  Patient received  IV fluid bolus and was considered for admission to the hospital for further evaluation and treatment.  Assessment and plan.  Status post fall, debility, deconditioning.   Found on the floor.  Uncertain etiology.  Could be from pneumonia and weakness.  Patient was negative for COVID influenza and RSV.      PT evaluation.   Right lower lobe pneumonia with hypoxia.   Patient had leukocytosis and tachycardia on presentation with hypoxia and right lower lobe pneumonia.  Possibility of aspiration.  Will get a speech therapy evaluation.  Aspiration precautions.  Continue albuterol nebulizer..  Incentive spirometry.  Continue Rocephin and Zithromax. Follow-up blood cultures.  Elevated troponins.  EKG showed sinus tachycardia with left bundle branch block similar to previous EKG from 2020.Troponin elevated at 110 followed by 289 likely demand ischemia from a pneumonia, hypoxia.  Check 2D echocardiogram for wall motion abnormality.   EKG without any acute changes.  History of aortic stenosis status post TAVR.    2D echocardiogram from 07/16/2018 showed LV ejection fraction of 45 to 50% with regional wall motion abnormalities.  Recheck 2D echocardiogram this time   Possible mild AKI on  CKD stage IIIa.   Creatinine today at 1.8.  Creatinine on presentation at 1.9.  Received gentle IV fluids.  Diabetes mellitus type 2 with hyperglycemia. Continue long-acting insulin sliding scale insulin diabetic diet.  Closely monitor blood glucose levels.  Latest POC glucose of 150.  Posterior herpetic neuralgia with intractable pain. Intractable pain.  Continue gabapentin and oral narcotics.  Challenge has been to to balance between pain in sedation on him.  Patient has been applying combination cream and I have encouraged family to bring the cream.  Have added oxycodone at this time due to complaint of excessive pain.   BPH.  Continue finasteride     DVT prophylaxis: heparin injection 5,000 Units Start: 07/25/22 2200   Code Status:     Code Status: DNR  Disposition:  OT has recommended skilled nursing facility placement.  Will get TOC involvement.  Status is: Inpatient  Remains inpatient appropriate because: Pneumonia, IV antibiotic, intractable pain   Family Communication: Spoke with the patient's daughter at bedside on  07/25/22  Consultants:  None so far  Procedures:  None  Antimicrobials:  Rocephin and Zithromax iv 07/25/22>  Anti-infectives (From admission, onward)    Start     Dose/Rate Route Frequency Ordered Stop  07/25/22 1630  cefTRIAXone (ROCEPHIN) 2 g in sodium chloride 0.9 % 100 mL IVPB        2 g 200 mL/hr over 30 Minutes Intravenous Every 24 hours 07/25/22 1625 07/30/22 1629   07/25/22 1630  azithromycin (ZITHROMAX) 500 mg in sodium chloride 0.9 % 250 mL IVPB        500 mg 250 mL/hr over 60 Minutes Intravenous Every 24 hours 07/25/22 1625 07/30/22 1629       Subjective: Today, patient was  seen and examined at bedside.  Patient's daughter at bedside.  Patient has not having bowel movements or eating much.  Patient has been complaining of severe pain over his left for a for a long time now.  Objective: Vitals:   07/26/22 0200 07/26/22 0300 07/26/22 0400 07/26/22 0500  BP: (!) 125/56  (!) 123/47   Pulse: 81 76 78   Resp: 16 16 18   $ Temp:   (!) 97.3 F (36.3 C)   TempSrc:      SpO2: 98% 98% 90%   Weight:    88 kg  Height:        Intake/Output Summary (Last 24 hours) at 07/26/2022 0737 Last data filed at 07/26/2022 0400 Gross per 24 hour  Intake 850 ml  Output 200 ml  Net 650 ml   Filed Weights   07/25/22 1546 07/26/22 0500  Weight: 99.7 kg 88 kg    Physical Examination: Body mass index is 28.65 kg/m.  General:  Average built, not in obvious distress, elderly male, obese, in mild distress.  Closing his eyes, complains of pain.  On supplemental oxygen HENT:   No scleral pallor or icterus noted. Oral mucosa is moist.  Tenderness over the left forehead region on palpation. Chest:    Diminished breath sounds bilaterally. No crackles or wheezes.  CVS: S1 &S2 heard. No murmur.  Regular rate and rhythm. Abdomen: Soft, nontender, nondistended.  Bowel sounds are heard.   Extremities: No cyanosis, clubbing or edema.  Peripheral pulses are palpable. Psych: Alert, awake and Communicative,, anxious mood. CNS:  No cranial nerve deficits.  Generalized weakness noted Skin: Warm and dry.  Mild erythema noted over the left periorbital region.  Data Reviewed:   CBC: Recent Labs  Lab 07/25/22 1549 07/25/22 2202 07/26/22 0410  WBC 11.2* 7.1 6.9  NEUTROABS 9.6*  --   --   HGB 12.6* 11.6* 11.0*  HCT 41.0 36.8* 35.7*  MCV 103.3* 102.8* 104.7*  PLT 143* 129* 129*    Basic Metabolic Panel: Recent Labs  Lab 07/25/22 1549 07/25/22 2202 07/26/22 0410  NA 142  --  141  K 4.5  --  4.5  CL 103  --  109  CO2 23  --  23  GLUCOSE 190*  --  129*  BUN 43*  --  50*  CREATININE  1.96* 1.97* 1.88*  CALCIUM 9.1  --  8.4*    Liver Function Tests: Recent Labs  Lab 07/25/22 1549 07/26/22 0410  AST 45* 79*  ALT 25 36  ALKPHOS 80 69  BILITOT 0.9 0.8  PROT 7.1 5.9*  ALBUMIN 3.1* 2.6*     Radiology Studies: DG Chest Port 1 View  Result Date: 07/25/2022 CLINICAL DATA:  Possible sepsis.  Cough. EXAM: PORTABLE CHEST 1 VIEW COMPARISON:  None Available. FINDINGS: The cardiomediastinal silhouette is unchanged. New RIGHT LOWER lung airspace opacities likely represents pneumonia. There is no evidence of pneumothorax or large pleural effusion. No acute bony abnormalities are identified. IMPRESSION: New  RIGHT LOWER lung airspace opacities likely representing pneumonia. Radiographic follow-up to resolution is recommended. Electronically Signed   By: Margarette Canada M.D.   On: 07/25/2022 16:31      LOS: 1 day    Flora Lipps, MD [facility] Available via Epic secure chat 7am-7pm After these hours, please refer to coverage provider listed on [email or web address] 07/26/2022, 7:37 AM

## 2022-07-26 NOTE — Evaluation (Signed)
Clinical/Bedside Swallow Evaluation Patient Details  Name: [name] [name] MRN: [id number] Date of Birth: 1931-10-07  Today's Date: 07/26/2022 Time: SLP Start Time (ACUTE ONLY): 90 SLP Stop Time (ACUTE ONLY): 1100 SLP Time Calculation (min) (ACUTE ONLY): 20 min  Past Medical History:  Past Medical History:  Diagnosis Date   Arthritis of back    BPH (benign prostatic hyperplasia)    CKD (chronic kidney disease) stage 3, GFR 30-59 ml/min (HCC)    Colon polyps    Diabetes mellitus, type 2 ([place])    Diabetic nephropathy ([place])    Diverticulosis    ED (erectile dysfunction)    Gallstones    GERD (gastroesophageal reflux disease)    Glaucoma    Hiatal hernia    History of gout    History of hiatal hernia    Hx of urinary tract infection    Hypercholesterolemia    Hypertension    Left groin hematoma 07/04/2018   Osteoarthritis    left knee   Pneumonia    PVD (peripheral vascular disease) (HCC)    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   26 mm Edwards Sapien 3 transcatheter heart valve placed via percutaneous right transfemoral approach    Severe aortic stenosis    Sleep apnea    Sleep apnea, obstructive    Past Surgical History:  Past Surgical History:  Procedure Laterality Date   ABDOMINAL AORTIC ANEURYSM REPAIR  1995   APPENDECTOMY     CATARACT EXTRACTION Bilateral 2003   FEMORAL-POPLITEAL BYPASS GRAFT     left   INTRAOPERATIVE TRANSTHORACIC ECHOCARDIOGRAM  07/04/2018   Procedure: INTRAOPERATIVE TRANSTHORACIC ECHOCARDIOGRAM;  Surgeon: Sherren Mocha, MD;  Location: [facility];  Service: Open Heart Surgery;;   LUMBAR SPINE SURGERY  03/2001   REPLACEMENT TOTAL KNEE Bilateral 2004   RIGHT/LEFT HEART CATH AND CORONARY ANGIOGRAPHY N/A 06/13/2018   Procedure: RIGHT/LEFT HEART CATH AND CORONARY ANGIOGRAPHY;  Surgeon: Martinique, Peter M, MD;  Location: [facility];  Service: Cardiovascular;  Laterality: N/A;   TEE WITHOUT CARDIOVERSION  07/04/2018   Procedure:  TRANSESOPHAGEAL ECHOCARDIOGRAM (TEE);  Surgeon: Sherren Mocha, MD;  Location: [facility];  Service: Open Heart Surgery;;   TOTAL KNEE ARTHROPLASTY     bilateral   TRANSCATHETER AORTIC VALVE REPLACEMENT, TRANSFEMORAL  07/04/2018   TRANSCATHETER AORTIC VALVE REPLACEMENT, TRANSFEMORAL N/A 07/04/2018   Procedure: TRANSCATHETER AORTIC VALVE REPLACEMENT, TRANSFEMORAL;  Surgeon: Sherren Mocha, MD;  Location: [facility];  Service: Open Heart Surgery;  Laterality: N/A;   VASECTOMY     HPI:  Patient is an 89 y.o. male with PMH: HTN, aortic stenosis status post TAVR, sleep apnea, GERD, diabetes mellitus type 2, CKD stage III, BPH, hiatal hernia (daughter reports it is large but he is not surgical candidate. He presented to the hospital on 07/25/22 after being found on the floor by his wife; unwitnessed fall slipping from chair onto floor. CXR showed right LL PNA.    Assessment / Plan / Recommendation  Clinical Impression  Patient is not currently presenting with clinical s/s of oral or pharyngeal phase dysphagia as per this bedside swallow evaluation. Swallow initiation was timely and although patient did have coughing after sips of water, these were all productive, resulting in patient expectorating phlegm into oral cavity and SLP suctioning out. He has a h/o GERD and hiatal hernia (daughter reports that it is large and surgery would be necessary to correct it but he is not a candidate.) SLP not recommending further skilled intervention but recommending patient follow  reflux precautions and ensure that oral mucosa is moist and clean. SLP Visit Diagnosis: Dysphagia, unspecified (R13.10)    Aspiration Risk  Mild aspiration risk    Diet Recommendation Regular;Thin liquid;Dysphagia 3 (Mech soft)   Medication Administration: Whole meds with liquid Supervision: Patient able to self feed Compensations: Slow rate;Small sips/bites Postural Changes: Seated upright at 90 degrees;Remain upright for at least 30 minutes  after po intake    Other  Recommendations Oral Care Recommendations: Oral care BID    Recommendations for follow up therapy are one component of a multi-disciplinary discharge planning process, led by the attending physician.  Recommendations may be updated based on patient status, additional functional criteria and insurance authorization.  Follow up Recommendations No SLP follow up      Assistance Recommended at Discharge    Functional Status Assessment Patient has not had a recent decline in their functional status  Frequency and Duration            Prognosis Prognosis for improved oropharyngeal function: Good      Swallow Study   General Date of Onset: 07/25/22 HPI: Patient is an 89 y.o. male with PMH: HTN, aortic stenosis status post TAVR, sleep apnea, GERD, diabetes mellitus type 2, CKD stage III, BPH, hiatal hernia (daughter reports it is large but he is not surgical candidate. He presented to the hospital on 07/25/22 after being found on the floor by his wife; unwitnessed fall slipping from chair onto floor. CXR showed right LL PNA. Type of Study: Bedside Swallow Evaluation Previous Swallow Assessment: none found Diet Prior to this Study: Regular;Thin liquids (Level 0) Temperature Spikes Noted: No Respiratory Status: Nasal cannula History of Recent Intubation: No Behavior/Cognition: Alert;Cooperative;Pleasant mood;Lethargic/Drowsy Oral Cavity Assessment: Within Functional Limits Oral Care Completed by SLP: Yes Oral Cavity - Dentition: Adequate natural dentition Vision: Functional for self-feeding Self-Feeding Abilities: Able to feed self Patient Positioning: Upright in bed Baseline Vocal Quality: Normal Volitional Cough: Congested;Strong Volitional Swallow: Able to elicit    Oral/Motor/Sensory Function Overall Oral Motor/Sensory Function: Within functional limits   Ice Chips     Thin Liquid Thin Liquid: Within functional limits Presentation: Straw    Nectar Thick      Honey Thick     Puree Puree: Within functional limits Presentation: Spoon   Solid     Solid: Not tested      Sonia Baller, MA, CCC-SLP Speech Therapy

## 2022-07-26 NOTE — Evaluation (Signed)
Physical Therapy Evaluation Patient Details Name: [name] [name] MRN: [id number] DOB: [birth year] Today's Date: 07/26/2022  History of Present Illness  Patient is a 89 years old male with past medical history of hypertension, aortic stenosis status post TAVR, sleep apnea, GERD, diabetes mellitus type 2, CKD stage III, BPH was brought into the hospital after being found on the floor by his wife after slipping from the chair and was on the ground for a while, it was an unwitnessed fall. X-ray of the chest showed right lower lobe pneumonia.  Clinical Impression  Bed level eval only. Pt was lethargic-daughter thinks it is from medications. Multimodal repeated cueing required for UE/LE assessment and bed mobility. Pt requires +2 assist for mobility at this time. Pt presents with significant general weakness, decreased activity tolerance, and impaired gait and balance. Daughter was present during session. PT recommendation is for ST SNF.        Recommendations for follow up therapy are one component of a multi-disciplinary discharge planning process, led by the attending physician.  Recommendations may be updated based on patient status, additional functional criteria and insurance authorization.  Follow Up Recommendations Skilled nursing-short term rehab (<3 hours/day) Can patient physically be transported by private vehicle: No    Assistance Recommended at Discharge Frequent or constant Supervision/Assistance  Patient can return home with the following  Assistance with cooking/housework;Assist for transportation;Two people to help with walking and/or transfers;Two people to help with bathing/dressing/bathroom;Help with stairs or ramp for entrance    Equipment Recommendations None recommended by PT  Recommendations for Other Services       Functional Status Assessment Patient has had a recent decline in their functional status and demonstrates the ability to make significant improvements in function  in a reasonable and predictable amount of time.     Precautions / Restrictions Precautions Precautions: Fall Precaution Comments: Aspiration. Severe left eye pain. Restrictions Weight Bearing Restrictions: No      Mobility  Bed Mobility Overal bed mobility: Needs Assistance Bed Mobility: Rolling Rolling: Mod assist, +2 for physical assistance, +2 for safety/equipment         General bed mobility comments: Rolling to L side for repositioning with nursing. He did assist with flexing R knee and initiating roll. Still requires significant assistance.    Transfers                        Ambulation/Gait                  Stairs            Wheelchair Mobility    Modified Rankin (Stroke Patients Only)       Balance                                             Pertinent Vitals/Pain Pain Assessment Pain Assessment: Faces Faces Pain Scale: Hurts little more Pain Location: buttocks as well as  pain from shingles on face Pain Descriptors / Indicators: Stabbing, Penetrating, Pins and needles, Tender, Grimacing, Discomfort Pain Intervention(s): Limited activity within patient's tolerance, Monitored during session, Repositioned    Home Living Family/patient expects to be discharged to:: Unsure Living Arrangements: Spouse/significant other Available Help at Discharge: Family Type of Home: House Home Access: Stairs to enter   [employer] of Steps: 2-3.  A rail and ramp are being installed  but not yet completed Alternate Level Stairs-Number of Steps: Full flight. Home Layout: Two level;1/2 bath on main level;Bed/bath upstairs Home Equipment: Rollator (4 wheels);Rolling Walker (2 wheels);Wheelchair - manual;Wheelchair - power;Grab bars - tub/shower;BSC/3in1      Prior Function Prior Level of Function : Needs assist;History of Falls (last six months)             Mobility Comments: Daughter, Mechele Claude present to assist  with history. States that pt has been in his recliner without getting out even to void for almost 2 weeks. During this time pt had 2 falls out of chair and spouse has called 911 for help 3 times.  Pt reports that he was ambulating very short distances to the bathroom about 3 weeks ago.  Pt sleeps in his recliner at baseline.  Spouse refuses to use BSC and is resistant to consideration of ALF although both pt and dtr agree that both pt and spouse need increased help in home and all kids work or live out of town. ADLs Comments: Pt has not left his recliner in 2 weeks. Has bladder voided into a "bucket" and has not had a bowel movement in 2 weeks due to poor PO intake. Pt reports that he helps his wife more at baseline than she helps him.     Hand Dominance   Dominant Hand: Right    Extremity/Trunk Assessment   Upper Extremity Assessment Upper Extremity Assessment: Defer to OT evaluation    Lower Extremity Assessment Lower Extremity Assessment: Difficult to assess due to impaired cognition (and lethargy. LE strength at least 2/5)       Communication   Communication: Expressive difficulties (likely due to lethargy-? meds)  Cognition Arousal/Alertness: Lethargic, Suspect due to medications Behavior During Therapy: Flat affect Overall Cognitive Status: Difficult to assess                                 General Comments: Pt did not speak much. He did shake his head "no" intermittently. Eyes mostly closed during session.        General Comments      Exercises General Exercises - Lower Extremity Ankle Circles/Pumps: PROM, Both, 5 reps, Supine Heel Slides: AAROM, Both, 5 reps, Supine Straight Leg Raises: PROM, 5 reps, Supine   Assessment/Plan    PT Assessment Patient needs continued PT services  PT Problem List Decreased strength;Decreased range of motion;Decreased activity tolerance;Decreased balance;Decreased mobility;Pain;Decreased skin integrity       PT  Treatment Interventions DME instruction;Therapeutic exercise;Gait training;Balance training;Functional mobility training;Therapeutic activities;Patient/family education    PT Goals (Current goals can be found in the Care Plan section)  Acute Rehab PT Goals Patient Stated Goal: patient and wife prefer home. daughter feels they need more assistance PT Goal Formulation: With family Time For Goal Achievement: 08/09/22 Potential to Achieve Goals: Fair    Frequency Min 2X/week     Co-evaluation               AM-PAC PT "6 Clicks" Mobility  Outcome Measure Help needed turning from your back to your side while in a flat bed without using bedrails?: A Lot Help needed moving from lying on your back to sitting on the side of a flat bed without using bedrails?: Total Help needed moving to and from a bed to a chair (including a wheelchair)?: Total Help needed standing up from a chair using your arms (e.g., wheelchair or bedside chair)?:  Total Help needed to walk in hospital room?: Total Help needed climbing 3-5 steps with a railing? : Total 6 Click Score: 7    End of Session   Activity Tolerance: Patient limited by lethargy Patient left: in bed;with call bell/phone within reach;with nursing/sitter in room;with family/visitor present   PT Visit Diagnosis: History of falling (Z91.81);Pain;Difficulty in walking, not elsewhere classified (R26.2);Muscle weakness (generalized) (M62.81)    Time: [id number] PT Time Calculation (min) (ACUTE ONLY): 12 min   Charges:   PT Evaluation $PT Eval Moderate Complexity: [place], PT Acute Rehabilitation  Office: [phone]

## 2022-07-26 NOTE — Progress Notes (Signed)
Pt has not eaten or drank much at all today with repositioning & sitting in high fowlers. He has little to no appetite. Urinated only 300 ml today, bladder scanned for 54 ml, (Most amount of three scans). IVF were scheduled to be stopped @ 1815. Notified Dr. Louanne Belton. New orders to continue NS @ 100 ml/hr.

## 2022-07-26 NOTE — Progress Notes (Signed)
Attempted with OT, to get pt OOB into chair. Pt became extremely weak, & unable to progress into chair. Returned to bed, with both OT & self. Pt unable to perform any activity by self.

## 2022-07-26 NOTE — Evaluation (Signed)
Occupational Therapy Evaluation Patient Details Name: [name] [name] MRN: [id number] DOB: 15-Apr-1932 Today's Date: 07/26/2022   History of Present Illness Patient is a 89 years old male with past medical history of hypertension, aortic stenosis status post TAVR, sleep apnea, GERD, diabetes mellitus type 2, CKD stage III, BPH was brought into the hospital after being found on the floor by his wife after slipping from the chair and was on the ground for a while, it was an unwitnessed fall. X-ray of the chest showed right lower lobe pneumonia.   Clinical Impression   Patient is currently requiring assistance with ADLs including Total assist with bed level Lower body ADLs, maximum to total assist with bed level Upper body ADLs,  as well as  up to Max assist of 2 people with bed mobility and inability to functionally transfer to toilet.  Current level of function is below patient's typical baseline.  During this evaluation, patient was limited by generalized weakness, impaired activity tolerance, impaired skin integrity, and primarily pain esp to LT eye, forehead and buttocks, all of which has the potential to impact patient's and caregiver's safety and independence during functional mobility, as well as performance for ADLs.  Patient lives in a 2 story home with full bath upstairs and 2-3 steps to enter, with his wife who is unable to provide the necessary supervision and assistance that pt currently requires and has had to call 911 in the past 10-14 days for help with pt due to falls.  Patient demonstrates good rehab potential, and should benefit from continued skilled occupational therapy services while in acute care to maximize safety, independence and quality of life at home.  Continued occupational therapy services in a SNF setting prior to return home is recommended.  ?       Recommendations for follow up therapy are one component of a multi-disciplinary discharge planning process, led by the attending  physician.  Recommendations may be updated based on patient status, additional functional criteria and insurance authorization.   Follow Up Recommendations  Skilled nursing-short term rehab (<3 hours/day)     Assistance Recommended at Discharge Frequent or constant Supervision/Assistance  Patient can return home with the following Two people to help with walking and/or transfers;Assistance with cooking/housework;Assist for transportation;A lot of help with walking and/or transfers;A lot of help with bathing/dressing/bathroom    Functional Status Assessment  Patient has had a recent decline in their functional status and demonstrates the ability to make significant improvements in function in a reasonable and predictable amount of time.  Equipment Recommendations  Other (comment) Media planner. Will also defer to post-acute recommendations)    Recommendations for Other Services       Precautions / Restrictions Precautions Precautions: Fall Precaution Comments: Aspiration, Contact, Severe LT eye pain. Cannot tolerate washcloth touching. Restrictions Weight Bearing Restrictions: No      Mobility Bed Mobility Overal bed mobility: Needs Assistance Bed Mobility: Rolling, Supine to Sit, Sit to Supine Rolling: Mod assist, +2 for physical assistance   Supine to sit: Mod assist, HOB elevated Sit to supine: Max assist, +2 for physical assistance        Transfers                          Balance Overall balance assessment: History of Falls, Needs assistance   Sitting balance-Leahy Scale: Poor  ADL either performed or assessed with clinical judgement   ADL Overall ADL's : Needs assistance/impaired   Eating/Feeding Details (indicate cue type and reason): Currently refusing PO Grooming: Wash/dry face;Total assistance;Bed level Grooming Details (indicate cue type and reason): Pt refused to try, unable to tolerate  feather light touch to LT eye. Unable to tolerate cleansing RT eye. Total Assist to clean aroun mouth. Upper Body Bathing: Total assistance;Bed level Upper Body Bathing Details (indicate cue type and reason): Too much pain to actively assist with his own care Lower Body Bathing: Total assistance;Bed level;+2 for physical assistance   Upper Body Dressing : Maximal assistance;Bed level   Lower Body Dressing: Bed level;Total assistance     Toilet Transfer Details (indicate cue type and reason): Unable to safely attempt transfers this session due to pt desaturating to low 80s while EOB and due to severely kyphotic sitting at EOB. Toileting- Clothing Manipulation and Hygiene: Total assistance;+2 for physical assistance;Bed level       Functional mobility during ADLs: Moderate assistance;+2 for physical assistance;Cueing for sequencing;Cueing for safety General ADL Comments: Rolling and Supine<>Sit only. Unable to stand this session     Vision Ability to See in Adequate Light: 2 Moderately impaired Additional Comments: Poor tolerace to opening eyes due to LT eye pain with some radiation to RT. Eyes heavily crusted but pt unable to tolerate gentle cleansing.     Perception     Praxis      Pertinent Vitals/Pain Pain Assessment Pain Assessment: Faces Faces Pain Scale: Hurts worst Pain Location: Primary head, pt also mentioned buttocks pain.  RN into room to address skin breakdown throuighout peri area. Pain Descriptors / Indicators: Stabbing, Penetrating, Pins and needles, Tender     Hand Dominance Right   Extremity/Trunk Assessment Upper Extremity Assessment Upper Extremity Assessment: Overall WFL for tasks assessed;LUE deficits/detail LUE Deficits / Details: Grossly 3+/5 shoulder.       Cervical / Trunk Assessment Cervical / Trunk Assessment: Other exceptions Cervical / Trunk Exceptions: Pt stooped far forward once EOB with chest toughing upper thighs. Pt assisted to upright  sitting repeatively but unable to maintain without assistance.   Communication Communication Communication: No difficulties (Prefers soft speaking due to head pain.)   Cognition Arousal/Alertness: Awake/alert Behavior During Therapy: Restless, Agitated, Anxious (due to pain) Overall Cognitive Status: Difficult to assess                                 General Comments: Pt in so much pain, difficult to assess. Pt was responding appropriately to most questions although daugther did correct one or two things.     General Comments       Exercises     Shoulder Instructions      Home Living Family/patient expects to be discharged to:: Private residence Living Arrangements: Spouse/significant other Available Help at Discharge: Family Type of Home: House Home Access: Stairs to enter [employer] of Steps: 2-3.  A rail and ramp are being installed but not yet completed   Home Layout: Two level;1/2 bath on main level;Bed/bath upstairs Alternate Level Stairs-Number of Steps: Full flight. Alternate Level Stairs-Rails: Left Bathroom Shower/Tub: Occupational psychologist: Standard (Grab bar next to toilet on 2nd floor but not ground floor half bath)     Home Equipment: Rollator (4 wheels);Rolling Walker (2 wheels);Wheelchair - manual;Wheelchair - power;Grab bars - tub/shower;BSC/3in1  Prior Functioning/Environment Prior Level of Function : Needs assist;History of Falls (last six months)             Mobility Comments: Daughter, Mechele Claude present to assist with history. States that pt has been in his recliner without getting out even to void for almost 2 weeks. During this time pt had 2 falls out of chair and spouse has called 911 for help 3 times.  Pt reports that he was ambulating very short distances to the bathroom about 3 weeks ago.  Pt sleeps in his recliner at baseline.  Spouse refuses to use BSC and is resistant to consideration of ALF  although both pt and dtr agree that both pt and spouse need increased help in home and all kids work or live out of town. ADLs Comments: Pt has not left his recliner in 2 weeks. Has bladder voided into a "bucket" and has not had a bowel movement in 2 weeks due to poor PO intake. Pt reports that he helps his wife more at baseline than she helps him.        OT Problem List: Pain;Decreased strength;Cardiopulmonary status limiting activity;Impaired sensation;Decreased activity tolerance;Impaired balance (sitting and/or standing);Decreased knowledge of use of DME or AE;Impaired vision/perception      OT Treatment/Interventions: Self-care/ADL training;Therapeutic exercise;Therapeutic activities;DME and/or AE instruction;Patient/family education;Balance training    OT Goals(Current goals can be found in the care plan section) Acute Rehab OT Goals Patient Stated Goal: Daughter agreeable to discharge recommendations for post acute inpatient therapy. OT Goal Formulation: With patient/family Time For Goal Achievement: 08/09/22 Potential to Achieve Goals: Good ADL Goals Pt Will Perform Grooming: sitting;with set-up;with supervision (And at least fair sitting balance) Pt Will Perform Lower Body Dressing: with min guard assist;sit to/from stand;sitting/lateral leans;with set-up;with adaptive equipment Pt Will Transfer to Toilet: stand pivot transfer;with min guard assist;bedside commode Pt Will Perform Toileting - Clothing Manipulation and hygiene: with caregiver independent in assisting;with adaptive equipment;with min assist;sitting/lateral leans;sit to/from stand Pt/caregiver will Perform Home Exercise Program: Increased strength;Both right and left upper extremity;With Supervision  OT Frequency: Min 2X/week    Co-evaluation              AM-PAC OT "6 Clicks" Daily Activity     Outcome Measure Help from another person eating meals?: A Lot Help from another person taking care of personal  grooming?: A Lot Help from another person toileting, which includes using toliet, bedpan, or urinal?: Total Help from another person bathing (including washing, rinsing, drying)?: Total Help from another person to put on and taking off regular upper body clothing?: A Lot Help from another person to put on and taking off regular lower body clothing?: Total 6 Click Score: 9   End of Session Equipment Utilized During Treatment: Oxygen Nurse Communication: Other (comment) (Rn in roomwith OT for full s ession)  Activity Tolerance: Patient limited by pain Patient left: in bed;with call bell/phone within reach;with family/visitor present;with nursing/sitter in room  OT Visit Diagnosis: Unsteadiness on feet (R26.81);Pain;Repeated falls (R29.6);Muscle weakness (generalized) (M62.81);History of falling (Z91.81);Feeding difficulties (R63.3) Pain - Right/Left: Left Pain - part of body:  (Head, LT eye, buttocks)                Time: [id number] OT Time Calculation (min): 45 min Charges:  OT General Charges $OT Visit: 1 Visit OT Evaluation $OT Eval Low Complexity: 1 Low OT Treatments $Self Care/Home Management : 8-22 mins $Therapeutic Activity: 8-22 mins  Anderson Malta, [place] Office: [phone] 07/26/2022  Julien Girt 07/26/2022, 9:31 AM

## 2022-07-26 NOTE — TOC Initial Note (Signed)
Transition of Care [facility]) - Initial/Assessment Note    Patient Details  Name: [name] [name] MRN: [id number] Date of Birth: 07/26/1931  Transition of Care [facility]) CM/SW Contact:    Dessa Phi, RN Phone Number: 07/26/2022, 3:30 PM  Clinical Narrative: Await PT recc. Monitor for d/c plans.                  Expected Discharge Plan:  (TBD) Barriers to Discharge: Continued Medical Work up   Patient Goals and CMS Choice            Expected Discharge Plan and Services                                              Prior Living Arrangements/Services                       Activities of Daily Living Home Assistive Devices/Equipment: Blood pressure cuff, Eyeglasses, Walker (specify type), Other (Comment) ADL Screening (condition at time of admission) Patient's cognitive ability adequate to safely complete daily activities?: Yes Is the patient deaf or have difficulty hearing?: No Does the patient have difficulty seeing, even when wearing glasses/contacts?: No Does the patient have difficulty concentrating, remembering, or making decisions?: No Patient able to express need for assistance with ADLs?: Yes Does the patient have difficulty dressing or bathing?: Yes Independently performs ADLs?: No Communication: Needs assistance Is this a change from baseline?: Pre-admission baseline Dressing (OT): Needs assistance Is this a change from baseline?: Pre-admission baseline Grooming: Needs assistance Is this a change from baseline?: Pre-admission baseline Feeding: Needs assistance Is this a change from baseline?: Pre-admission baseline Bathing: Needs assistance Is this a change from baseline?: Pre-admission baseline Toileting: Needs assistance Is this a change from baseline?: Pre-admission baseline In/Out Bed: Needs assistance Is this a change from baseline?: Pre-admission baseline Walks in Home: Needs assistance Is this a change from baseline?: Pre-admission  baseline Does the patient have difficulty walking or climbing stairs?: Yes Weakness of Legs: Both Weakness of Arms/Hands: Both  Permission Sought/Granted                  Emotional Assessment              Admission diagnosis:  Right lower lobe pneumonia [J18.9] Acute respiratory failure with hypoxia ([place]) [J96.01] Sepsis due to pneumonia ([place]) [id number], A41.9] Patient Active Problem List   Diagnosis Date Noted   Right lower lobe pneumonia 07/25/2022   Hypoxia 07/25/2022   AKI (acute kidney injury) ([place]) 07/25/2022   Fall at home, initial encounter 07/25/2022   Post herpetic neuralgia 07/25/2022   Posterior vitreous detachment of both eyes 01/15/2021   Central retinal vein occlusion, left eye, with macular edema 01/30/2020   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   Left groin hematoma 07/04/2018   Sleep apnea    Gallstones    CKD (chronic kidney disease) stage 3, GFR 30-59 ml/min (HCC)    CAD (coronary artery disease)    Severe aortic stenosis 06/13/2018   Incisional hernia, without obstruction or gangrene 10/29/2012   Hypertension    PVD (peripheral vascular disease) ([place])    Diabetes mellitus, type 2 ([place])    Hypercholesterolemia    PCP:  Donnajean Lopes, MD Pharmacy:   [place], [address]  [place] [address] Phone: [phone] Fax: [phone]     Social Determinants of Health (SDOH) Social History: SDOH Screenings   Food Insecurity: No Food Insecurity (07/26/2022)  Housing: Low Risk  (07/25/2022)  Transportation Needs: Unmet Transportation Needs (07/25/2022)  Utilities: Not At Risk (07/25/2022)  Depression (PHQ2-9): Low Risk  (06/29/2018)  Tobacco Use: Medium Risk (02/25/2022)   SDOH Interventions:     Readmission Risk Interventions     No data to display

## 2022-07-27 ENCOUNTER — Other Ambulatory Visit (HOSPITAL_COMMUNITY): Payer: Medicare Other

## 2022-07-27 DIAGNOSIS — I10 Essential (primary) hypertension: Secondary | ICD-10-CM | POA: Diagnosis not present

## 2022-07-27 DIAGNOSIS — J189 Pneumonia, unspecified organism: Secondary | ICD-10-CM | POA: Diagnosis not present

## 2022-07-27 DIAGNOSIS — N179 Acute kidney failure, unspecified: Secondary | ICD-10-CM | POA: Diagnosis not present

## 2022-07-27 DIAGNOSIS — W19XXXA Unspecified fall, initial encounter: Secondary | ICD-10-CM | POA: Diagnosis not present

## 2022-07-27 LAB — CBC
HCT: 35.3 % — ABNORMAL LOW (ref 39.0–52.0)
Hemoglobin: 10.5 g/dL — ABNORMAL LOW (ref 13.0–17.0)
MCH: 32 pg (ref 26.0–34.0)
MCHC: 29.7 g/dL — ABNORMAL LOW (ref 30.0–36.0)
MCV: 107.6 fL — ABNORMAL HIGH (ref 80.0–100.0)
Platelets: 129 10*3/uL — ABNORMAL LOW (ref 150–400)
RBC: 3.28 MIL/uL — ABNORMAL LOW (ref 4.22–5.81)
RDW: 14.1 % (ref 11.5–15.5)
WBC: 6.7 10*3/uL (ref 4.0–10.5)
nRBC: 0 % (ref 0.0–0.2)

## 2022-07-27 LAB — MAGNESIUM: Magnesium: 2.1 mg/dL (ref 1.7–2.4)

## 2022-07-27 LAB — GLUCOSE, CAPILLARY
Glucose-Capillary: 71 mg/dL (ref 70–99)
Glucose-Capillary: 113 mg/dL — ABNORMAL HIGH (ref 70–99)
Glucose-Capillary: 116 mg/dL — ABNORMAL HIGH (ref 70–99)
Glucose-Capillary: 116 mg/dL — ABNORMAL HIGH (ref 70–99)

## 2022-07-27 LAB — BASIC METABOLIC PANEL
Anion gap: 10 (ref 5–15)
BUN: 42 mg/dL — ABNORMAL HIGH (ref 8–23)
CO2: 23 mmol/L (ref 22–32)
Calcium: 8.3 mg/dL — ABNORMAL LOW (ref 8.9–10.3)
Chloride: 110 mmol/L (ref 98–111)
Creatinine, Ser: 1.68 mg/dL — ABNORMAL HIGH (ref 0.61–1.24)
GFR, Estimated: 39 mL/min — ABNORMAL LOW (ref >60)
Glucose, Bld: 71 mg/dL (ref 70–99)
Potassium: 4.3 mmol/L (ref 3.5–5.1)
Sodium: 143 mmol/L (ref 135–145)

## 2022-07-27 LAB — PHOSPHORUS: Phosphorus: 3.9 mg/dL (ref 2.5–4.6)

## 2022-07-27 MED ORDER — SODIUM CHLORIDE 0.9 % IV SOLN: INTRAVENOUS | Status: DC

## 2022-07-27 NOTE — Plan of Care (Signed)
  Problem: Safety: Goal: Ability to remain free from injury will improve Outcome: Progressing   

## 2022-07-27 NOTE — NC FL2 (Signed)
[place] MEDICAID FL2 LEVEL OF CARE FORM     IDENTIFICATION  Patient Name: [name] [name] Birthdate: Feb 12, 1932 Sex: male Admission Date (Current Location): 07/25/2022  [facility] and [place] Number:  Herbalist and Address:  [facility],  [place] [address] [place]      Provider Number: [id number]  Attending Physician Name and Address:  Flora Lipps, MD  Relative Name and Phone Number:   Mechele Claude [phone]    Current Level of Care: Hospital Recommended Level of Care: [place] Prior Approval Number:    Date Approved/Denied:   PASRR Number:  ([id number])  Discharge Plan: SNF    Current Diagnoses: Patient Active Problem List   Diagnosis Date Noted   Right lower lobe pneumonia 07/25/2022   Hypoxia 07/25/2022   AKI (acute kidney injury) ([place]) 07/25/2022   Fall at home, initial encounter 07/25/2022   Post herpetic neuralgia 07/25/2022   Posterior vitreous detachment of both eyes 01/15/2021   Central retinal vein occlusion, left eye, with macular edema 01/30/2020   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   Left groin hematoma 07/04/2018   Sleep apnea    Gallstones    CKD (chronic kidney disease) stage 3, GFR 30-59 ml/min (HCC)    CAD (coronary artery disease)    Severe aortic stenosis 06/13/2018   Incisional hernia, without obstruction or gangrene 10/29/2012   Hypertension    PVD (peripheral vascular disease) (HCC)    Diabetes mellitus, type 2 (HCC)    Hypercholesterolemia     Orientation RESPIRATION BLADDER Height & Weight     Self, Situation, Time, Place  O2 Continent Weight: 89.3 kg Height:  5' 9"$  (175.3 cm)  BEHAVIORAL SYMPTOMS/MOOD NEUROLOGICAL BOWEL NUTRITION STATUS      Continent Diet (dysphagia 3-mechanical soft)  AMBULATORY STATUS COMMUNICATION OF NEEDS Skin   Limited Assist Verbally Skin abrasions (gen'l bruising)                       Personal Care Assistance  Level of Assistance  Bathing, Feeding, Dressing   Feeding assistance: Limited assistance Dressing Assistance: Limited assistance     Functional Limitations Info  Sight, Hearing, Speech Sight Info: Impaired Hearing Info: Impaired Speech Info: Impaired    SPECIAL CARE FACTORS FREQUENCY  PT (By licensed PT), OT (By licensed OT)     PT Frequency:  (5x week) OT Frequency:  (5x week)            Contractures Contractures Info: Not present    Additional Factors Info  Code Status, Allergies Code Status Info:  (DNR) Allergies Info:  (Indocin)           Current Medications (07/27/2022):  This is the current hospital active medication list Current Facility-Administered Medications  Medication Dose Route Frequency Provider Last Rate Last Admin   0.9 %  sodium chloride infusion  250 mL Intravenous PRN Pokhrel, Laxman, MD       0.9 %  sodium chloride infusion   Intravenous Continuous Pokhrel, Laxman, MD 75 mL/hr at 07/27/22 1349 Rate Change at 07/27/22 1349   acetaminophen (TYLENOL) tablet 650 mg  650 mg Oral Q6H PRN Pokhrel, Laxman, MD   650 mg at 07/26/22 1129   Or   acetaminophen (TYLENOL) suppository 650 mg  650 mg Rectal Q6H PRN Pokhrel, Laxman, MD       albuterol (PROVENTIL) (2.5 MG/3ML) 0.083% nebulizer solution 2.5 mg  2.5 mg Nebulization Q4H PRN Suzzanne Cloud, [facility]  amLODipine (NORVASC) tablet 10 mg  10 mg Oral Daily Pokhrel, Laxman, MD   10 mg at 07/27/22 1019   aspirin chewable tablet 81 mg  81 mg Oral Once per day on Mon Thu Pokhrel, Laxman, MD   81 mg at 07/26/22 1129   azithromycin (ZITHROMAX) 500 mg in sodium chloride 0.9 % 250 mL IVPB  500 mg Intravenous Q24H Pokhrel, Laxman, MD   Stopped at 07/26/22 1727   cefTRIAXone (ROCEPHIN) 2 g in sodium chloride 0.9 % 100 mL IVPB  2 g Intravenous Q24H Pokhrel, Laxman, MD 200 mL/hr at 07/27/22 1516 2 g at 07/27/22 1516   dorzolamide-timolol (COSOPT) 2-0.5 % ophthalmic solution 1 drop  1 drop Left Eye BID Pokhrel, Laxman, MD    1 drop at 07/27/22 1021   finasteride (PROSCAR) tablet 5 mg  5 mg Oral Daily Pokhrel, Laxman, MD   5 mg at 07/27/22 1019   gabapentin (NEURONTIN) capsule 300 mg  300 mg Oral TID Pokhrel, Laxman, MD   300 mg at 07/27/22 1510   heparin injection 5,000 Units  5,000 Units Subcutaneous Q8H Pokhrel, Laxman, MD   5,000 Units at 07/27/22 1444   hydrocortisone cream 1 % 1 Application  1 Application Topical Daily PRN Pokhrel, Laxman, MD       insulin aspart (novoLOG) injection 0-5 Units  0-5 Units Subcutaneous QHS Pokhrel, Laxman, MD       insulin aspart (novoLOG) injection 0-9 Units  0-9 Units Subcutaneous TID WC Pokhrel, Laxman, MD   1 Units at 07/26/22 1627   insulin glargine-yfgn (SEMGLEE) injection 32 Units  32 Units Subcutaneous Daily Pokhrel, Laxman, MD   32 Units at 07/27/22 1019   multivitamin with minerals tablet 1 tablet  1 tablet Oral Daily Pokhrel, Laxman, MD   1 tablet at 07/27/22 1019   nitroGLYCERIN (NITROSTAT) SL tablet 0.4 mg  0.4 mg Sublingual Q5 min PRN Pokhrel, Laxman, MD       ondansetron (ZOFRAN) tablet 4 mg  4 mg Oral Q6H PRN Pokhrel, Laxman, MD       Or   ondansetron (ZOFRAN) injection 4 mg  4 mg Intravenous Q6H PRN Pokhrel, Laxman, MD       oxyCODONE-acetaminophen (PERCOCET/ROXICET) 5-325 MG per tablet 1-2 tablet  1-2 tablet Oral Q6H PRN Pokhrel, Laxman, MD   1 tablet at 07/26/22 1129   polyvinyl alcohol (LIQUIFILM TEARS) 1.4 % ophthalmic solution 1 drop  1 drop Both Eyes Daily PRN Pokhrel, Laxman, MD       sodium chloride flush (NS) 0.9 % injection 3 mL  3 mL Intravenous Q12H Pokhrel, Laxman, MD   3 mL at 07/27/22 1020   sodium chloride flush (NS) 0.9 % injection 3 mL  3 mL Intravenous PRN Pokhrel, Laxman, MD         Discharge Medications: Please see discharge summary for a list of discharge medications.  Relevant Imaging Results:  Relevant Lab Results:   Additional Information  [phone])  Rosemaria Inabinet, Juliann Pulse, RN

## 2022-07-27 NOTE — TOC Initial Note (Signed)
Transition of Care [facility]) - Initial/Assessment Note    Patient Details  Name: [name] [name] MRN: [id number] Date of Birth: Mar 19, 1932  Transition of Care [facility]) CM/SW Contact:    Dessa Phi, RN Phone Number: 07/27/2022, 4:08 PM  Clinical Narrative:   Patient/Joann(dtr) agreed to [facility] SNF-await bed offers.               Expected Discharge Plan: Skilled Nursing Facility Barriers to Discharge: Continued Medical Work up   Patient Goals and CMS Choice            Expected Discharge Plan and Services                                              Prior Living Arrangements/Services                       Activities of Daily Living Home Assistive Devices/Equipment: Blood pressure cuff, Eyeglasses, Walker (specify type), Other (Comment) ADL Screening (condition at time of admission) Patient's cognitive ability adequate to safely complete daily activities?: Yes Is the patient deaf or have difficulty hearing?: No Does the patient have difficulty seeing, even when wearing glasses/contacts?: No Does the patient have difficulty concentrating, remembering, or making decisions?: No Patient able to express need for assistance with ADLs?: Yes Does the patient have difficulty dressing or bathing?: Yes Independently performs ADLs?: No Communication: Needs assistance Is this a change from baseline?: Pre-admission baseline Dressing (OT): Needs assistance Is this a change from baseline?: Pre-admission baseline Grooming: Needs assistance Is this a change from baseline?: Pre-admission baseline Feeding: Needs assistance Is this a change from baseline?: Pre-admission baseline Bathing: Needs assistance Is this a change from baseline?: Pre-admission baseline Toileting: Needs assistance Is this a change from baseline?: Pre-admission baseline In/Out Bed: Needs assistance Is this a change from baseline?: Pre-admission baseline Walks in Home: Needs assistance Is this a change from  baseline?: Pre-admission baseline Does the patient have difficulty walking or climbing stairs?: Yes Weakness of Legs: Both Weakness of Arms/Hands: Both  Permission Sought/Granted                  Emotional Assessment              Admission diagnosis:  Right lower lobe pneumonia [J18.9] Acute respiratory failure with hypoxia ([place]) [J96.01] Sepsis due to pneumonia ([place]) [id number], A41.9] Patient Active Problem List   Diagnosis Date Noted   Right lower lobe pneumonia 07/25/2022   Hypoxia 07/25/2022   AKI (acute kidney injury) ([place]) 07/25/2022   Fall at home, initial encounter 07/25/2022   Post herpetic neuralgia 07/25/2022   Posterior vitreous detachment of both eyes 01/15/2021   Central retinal vein occlusion, left eye, with macular edema 01/30/2020   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   Left groin hematoma 07/04/2018   Sleep apnea    Gallstones    CKD (chronic kidney disease) stage 3, GFR 30-59 ml/min (HCC)    CAD (coronary artery disease)    Severe aortic stenosis 06/13/2018   Incisional hernia, without obstruction or gangrene 10/29/2012   Hypertension    PVD (peripheral vascular disease) ([place])    Diabetes mellitus, type 2 ([place])    Hypercholesterolemia    PCP:  Donnajean Lopes, MD Pharmacy:   [place], [address]  [place] [address] Phone: [phone] Fax: [phone]     Social Determinants of Health (SDOH) Social History: SDOH Screenings   Food Insecurity: No Food Insecurity (07/26/2022)  Housing: Low Risk  (07/25/2022)  Transportation Needs: Unmet Transportation Needs (07/25/2022)  Utilities: Not At Risk (07/25/2022)  Depression (PHQ2-9): Low Risk  (06/29/2018)  Tobacco Use: Medium Risk (07/26/2022)   SDOH Interventions:     Readmission Risk Interventions     No data to display

## 2022-07-27 NOTE — Evaluation (Signed)
Clinical/Bedside Swallow Evaluation Patient Details  Name: [name] [name] MRN: [id number] Date of Birth: [birth year]  Today's Date: 07/27/2022 Time: SLP Start Time (ACUTE ONLY): [id number] SLP Stop Time (ACUTE ONLY): [id number] SLP Time Calculation (min) (ACUTE ONLY): 53 min  Past Medical History:  Past Medical History:  Diagnosis Date   Arthritis of back    BPH (benign prostatic hyperplasia)    CKD (chronic kidney disease) stage 3, GFR 30-59 ml/min (HCC)    Colon polyps    Diabetes mellitus, type 2 ([place])    Diabetic nephropathy ([place])    Diverticulosis    ED (erectile dysfunction)    Gallstones    GERD (gastroesophageal reflux disease)    Glaucoma    Hiatal hernia    History of gout    History of hiatal hernia    Hx of urinary tract infection    Hypercholesterolemia    Hypertension    Left groin hematoma 07/04/2018   Osteoarthritis    left knee   Pneumonia    PVD (peripheral vascular disease) (HCC)    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   26 mm Edwards Sapien 3 transcatheter heart valve placed via percutaneous right transfemoral approach    Severe aortic stenosis    Sleep apnea    Sleep apnea, obstructive    Past Surgical History:  Past Surgical History:  Procedure Laterality Date   ABDOMINAL AORTIC ANEURYSM REPAIR  1995   APPENDECTOMY     CATARACT EXTRACTION Bilateral 2003   FEMORAL-POPLITEAL BYPASS GRAFT     left   INTRAOPERATIVE TRANSTHORACIC ECHOCARDIOGRAM  07/04/2018   Procedure: INTRAOPERATIVE TRANSTHORACIC ECHOCARDIOGRAM;  Surgeon: Sherren Mocha, MD;  Location: [place];  Service: Open Heart Surgery;;   LUMBAR SPINE SURGERY  03/2001   REPLACEMENT TOTAL KNEE Bilateral 2004   RIGHT/LEFT HEART CATH AND CORONARY ANGIOGRAPHY N/A 06/13/2018   Procedure: RIGHT/LEFT HEART CATH AND CORONARY ANGIOGRAPHY;  Surgeon: Martinique, Peter M, MD;  Location: [facility] [facility];  Service: Cardiovascular;  Laterality: N/A;   TEE WITHOUT CARDIOVERSION  07/04/2018   Procedure:  TRANSESOPHAGEAL ECHOCARDIOGRAM (TEE);  Surgeon: Sherren Mocha, MD;  Location: [place] [facility];  Service: Open Heart Surgery;;   TOTAL KNEE ARTHROPLASTY     bilateral   TRANSCATHETER AORTIC VALVE REPLACEMENT, TRANSFEMORAL  07/04/2018   TRANSCATHETER AORTIC VALVE REPLACEMENT, TRANSFEMORAL N/A 07/04/2018   Procedure: TRANSCATHETER AORTIC VALVE REPLACEMENT, TRANSFEMORAL;  Surgeon: Sherren Mocha, MD;  Location: [facility];  Service: Open Heart Surgery;  Laterality: N/A;   VASECTOMY     HPI:  Patient is an 89 y.o. male with PMH: HTN, aortic stenosis status post TAVR, sleep apnea, GERD, diabetes mellitus type 2, CKD stage III, BPH, hiatal hernia (daughter reports it is large but he is not surgical candidate, remote smoker. He presented to the hospital on 07/25/22 after being found on the floor by his wife; unwitnessed fall slipping from chair onto floor. CXR showed right LL PNA.  Swallow eval was done 2/12 and reordered due to pt having coughing with liquids.  MBS advised - and ordered but xray unable to conduct this pm - therefore BSE completed.    Assessment / Plan / Recommendation  Clinical Impression  Pt has h/o lacunar cva - right thalamic - and know issues with dysphagia *barium tablet halted at vallecular space on UGI study 2019.  Pt admits to occasionally sensing food or pills lodging in his pharynx- pointing to vallecular space.  Chin tuck posture attempted but pt reports it caused dizziness, therefore unable  to be completed.  Patient presents with clinical indications of mild oropharyngeal dysphagia - mostly c/b overt coughing post-swallow of sequential swallows of thin liquids.    Small single sips via straw x7 were tolerated without coughing.  Pt has xerostomia - and trigeminal neuralgia impacting left opthalmic branch of left trigeminal nerve since having Shingles - potentially impacting mastication.  Mild oral retention with solid present without pt independent clearance - however cue to swallow to clear was  effective.  Daughter, [name], via phone, reports pt was coughing with each swallow of liquid to help oral clearance of solids.  Given pt's dysphagia since at least 2019, RIGHT LL pna and  lacunar CVA of right basal ganglia, MBS advised.  Pt and daughters agreeable to plan. Advised pt and daughters' that SLP goal is to mitigate dysphagia/aspiration  and maximize comfort with po.      Recommend soft/thin diet  SMALL SINGLE SIPS  Use applesauce or nectar liquids to help clear solids into pharynx  Medicine with nectar liquids - start with liquid to moisten mouth and pharynx.     Pt was to see a neurologist for his trigeminal neuralgia per daughter, but he has ended up in the hospital.  Encouraged them to follow up as an OP. SLP Visit Diagnosis: Dysphagia, oropharyngeal phase (R13.12)    Aspiration Risk  Moderate aspiration risk    Diet Recommendation Regular;Thin liquid;Dysphagia 3 (Mech soft)   Liquid Administration via: Cup;Straw Medication Administration: Other (Comment) (with nectar) Supervision: Patient able to self feed Compensations: Slow rate;Small sips/bites (use puree or nectar to clear oral retention of particles) Postural Changes: Seated upright at 90 degrees;Remain upright for at least 30 minutes after po intake    Other  Recommendations Oral Care Recommendations: Oral care BID    Recommendations for follow up therapy are one component of a multi-disciplinary discharge planning process, led by the attending physician.  Recommendations may be updated based on patient status, additional functional criteria and insurance authorization.  Follow up Recommendations  (TBD)      Assistance Recommended at Discharge    Functional Status Assessment Patient has not had a recent decline in their functional status  Frequency and Duration min 1 x/week  1 week       Prognosis Prognosis for improved oropharyngeal function: Fair Barriers to Reach Goals: Time post onset      Swallow Study    General Date of Onset: 07/27/22 HPI: Patient is an 89 y.o. male with PMH: HTN, aortic stenosis status post TAVR, sleep apnea, GERD, diabetes mellitus type 2, CKD stage III, BPH, hiatal hernia (daughter reports it is large but he is not surgical candidate, remote smoker. He presented to the hospital on 07/25/22 after being found on the floor by his wife; unwitnessed fall slipping from chair onto floor. CXR showed right LL PNA.  Swallow eval was done 2/12 and reordered due to pt having coughing with liquids.  MBS advised - and ordered but xray unable to conduct this pm - therefore BSE completed. Type of Study: Bedside Swallow Evaluation Previous Swallow Assessment: BSE 2/12, UGI study 2019 tablet halted at vallecular space Diet Prior to this Study: Regular;Thin liquids (Level 0) Temperature Spikes Noted: No Respiratory Status: Nasal cannula History of Recent Intubation: No Behavior/Cognition: Alert;Cooperative;Pleasant mood Oral Cavity Assessment: Dry;Other (comment) (coating on his tongue) Oral Care Completed by SLP: No (advised dtr who is dental assistant to help him after session with brushing) Vision: Functional for self-feeding Self-Feeding Abilities: Able to feed  self Patient Positioning: Upright in bed Baseline Vocal Quality: Normal Volitional Cough: Congested;Strong Volitional Swallow: Able to elicit    Oral/Motor/Sensory Function Overall Oral Motor/Sensory Function:  (did not test trigeminal nerve on the left due to pt's terrible neuralgia)   Ice Chips Ice chips: Within functional limits Presentation: Spoon   Thin Liquid Thin Liquid: Impaired Presentation: Straw Pharyngeal  Phase Impairments: Suspected delayed Swallow;Multiple swallows;Cough - Immediate    Nectar Thick Nectar Thick Liquid: Within functional limits Presentation: Self Fed;Straw   Honey Thick Honey Thick Liquid: Not tested   Puree Puree: Within functional limits Presentation: Spoon   Solid     Solid:  Impaired Presentation: Self Fed Oral Phase Impairments: Reduced lingual movement/coordination Oral Phase Functional Implications: Impaired mastication;Oral residue Pharyngeal Phase Impairments: Suspected delayed Swallow      [name] 07/27/2022,6:00 PM   [name], MS [place] Office [phone]

## 2022-07-27 NOTE — Progress Notes (Signed)
BSE completed, full report to follow. Daughter, Mechele Claude, present and [name], daughter, was spoken to on the telephone.  Pt has h/o lacunar cva - right thalamic - and know issues with dysphagia *barium tablet halted at vallecular space on UGI study 2019.  Pt admits to occasionally sensing food or pills lodging in his pharynx- pointing to vallecular space.  Chin tuck posture attempted but pt reports it caused dizziness, therefore unable to be completed.  Patient presents with clinical indications of mild oropharyngeal dysphagia - mostly c/b overt coughing post-swallow of sequential swallows of thin liquids.    Small single sips via straw x7 were tolerated without coughing.  Pt has xerostomia - and trigeminal neuralgia impacting left opthalmic branch of left trigeminal nerve since having Shingles - potentially impacting mastication.  Mild oral retention with solid present without pt independent clearance - however cue to swallow to clear was effective.  Daughter, [name], via phone, reports pt was coughing with each swallow of liquid to help oral clearance of solids.  Given pt's dysphagia since at least 2019, RIGHT LL pna and  lacunar CVA of right basal ganglia, MBS advised.  Pt and daughters agreeable to plan. Advised pt and daughters' that SLP goal is to mitigate dysphagia/aspiration  and maximize comfort with po.    Recommend soft/thin diet SMALL SINGLE SIPS Use applesauce or nectar liquids to help clear solids into pharynx Medicine with nectar liquids - start with liquid to moisten mouth and pharynx.   Pt was to see a neurologist for his trigeminal neuralgia per daughter, but he has ended up in the hospital.  Encouraged them to follow up as an OP.    [name], MS [place] Office [phone]

## 2022-07-27 NOTE — Progress Notes (Addendum)
PROGRESS NOTE    [name] [name]  [id number] DOB: Oct 09, 1931 DOA: 07/25/2022 PCP: Donnajean Lopes, MD    Brief Narrative:  Patient is a 89 years old male with past medical history of hypertension, aortic stenosis status post TAVR, sleep apnea, GERD, diabetes mellitus type 2, CKD stage III, BPH was brought into the hospital after being found on the floor by his wife after slipping from the chair and was on the ground for a while, it was an unwitnessed fall.  Patient has been having cough for a week or so and had been suffering from postherpetic facial pain.  In the ED, patient was afebrile but tachycardic with  hypoxia on presentation at 88% on room air and was placed on 8 L of nonrebreather mask.    CK level was mildly elevated.  COVID and influenza was negative.  BNP at 211.  Initial lactate was 4.1.  Blood glucose level was elevated.  Patient did have mild leukocytosis at 11.2. X-ray of the chest showed right lower lobe pneumonia.  Blood cultures were sent.  Patient received  IV fluid bolus and was considered for admission to the hospital for further evaluation and treatment.  Assessment and plan.  Status post fall, debility, deconditioning.   Found on the floor.  Uncertain etiology.  Could be from pneumonia and weakness.  Patient was negative for COVID influenza and RSV.  Patient was seen by physical therapy and recommended skilled nursing facility at this time.   Right lower lobe pneumonia with hypoxia.   Patient had leukocytosis and tachycardia on presentation with hypoxia and right lower lobe pneumonia.  Possibility of aspiration. Continue albuterol nebulizer.  Incentive spirometry.  Continue Rocephin and Zithromax.  Speech therapy has seen the patient at this time and recommended modified barium swallow.  Elevated troponins.  EKG showed sinus tachycardia with left bundle branch block similar to previous EKG from 2020.Troponin elevated at 110 followed by 289 likely demand ischemia from a  pneumonia, hypoxia.  Pending 2D echocardiogram for wall motion abnormality.  EKG without any acute changes.  History of aortic stenosis status post TAVR.    2D echocardiogram from 07/16/2018 showed LV ejection fraction of 45 to 50% with regional wall motion abnormalities.  Recheck 2D echocardiogram this time   Possible mild AKI on  CKD stage IIIa.   Creatinine today at 1.6 from initial level of 1.9.  Received IV fluids.  Will decrease IV fluids to 75 mill per hour for next 1 day.  Oral intake has been poor.  Encouraged oral intake.  Diabetes mellitus type 2 with hyperglycemia. Continue long-acting insulin sliding scale insulin diabetic diet.  Closely monitor blood glucose levels.  Latest POC glucose of 113.  Post-herpetic neuralgia with intractable pain. Intractable pain on presentation.  Feels much better today.  On gabapentin and oral oxycodone plus analgesic cream.   BPH.  Continue finasteride continue to monitor intake and output charting.     DVT prophylaxis: heparin injection 5,000 Units Start: 07/25/22 2200   Code Status:     Code Status: DNR  Disposition:  PT and OT has recommended skilled nursing facility placement at this time.  Likely disposition in 1 to 2 days.  Still has poor oral intake.  Status is: Inpatient  Remains inpatient appropriate because: Pneumonia, IV antibiotic, intractable pain from postherpetic neuralgia, poor oral intake.   Family Communication:  Spoke with the patient's daughter at bedside on  07/27/22  Consultants:  Speech therapy  Procedures:  None  Antimicrobials:  Rocephin and Zithromax iv 07/25/22>  Anti-infectives (From admission, onward)    Start     Dose/Rate Route Frequency Ordered Stop   07/25/22 1630  cefTRIAXone (ROCEPHIN) 2 g in sodium chloride 0.9 % 100 mL IVPB        2 g 200 mL/hr over 30 Minutes Intravenous Every 24 hours 07/25/22 1625 07/30/22 1629   07/25/22 1630  azithromycin (ZITHROMAX) 500 mg in sodium chloride 0.9 % 250  mL IVPB        500 mg 250 mL/hr over 60 Minutes Intravenous Every 24 hours 07/25/22 1625 07/30/22 1629       Subjective: Today, patient was seen and examined at bedside.  Family at bedside.  Patient did have a good night sleep for some time and complains of less pain.  Has not been eating much.  Denies fever but has some cough.  Denies any chest pain.  No noted fever.  Objective: Vitals:   07/27/22 0500 07/27/22 0600 07/27/22 0800 07/27/22 1000  BP:  132/63 (!) 129/53 (!) 115/48  Pulse:   75   Resp:  14    Temp:      TempSrc:      SpO2:   98%   Weight: 89.3 kg     Height:        Intake/Output Summary (Last 24 hours) at 07/27/2022 1258 Last data filed at 07/27/2022 1018 Gross per 24 hour  Intake 2528.18 ml  Output 1054 ml  Net 1474.18 ml    Filed Weights   07/25/22 1546 07/26/22 0500 07/27/22 0500  Weight: 99.7 kg 88 kg 89.3 kg    Physical Examination: Body mass index is 29.07 kg/m.   General: Elderly male, obese, closing his eyes, not in much distress today, mildly Communicative, HENT:   No scleral pallor or icterus noted. Oral mucosa is moist.  Tenderness on palpation of the left upper orbital region.   Chest:    Decreased breath sounds bilaterally. CVS: S1 &S2 heard. No murmur.  Regular rate and rhythm. Abdomen: Soft, nontender, nondistended.  Bowel sounds are heard.   Extremities: No cyanosis, clubbing or edema.  Peripheral pulses are palpable. Psych: Alert, awake and Communicative, answering appropriately, CNS:  No cranial nerve deficits.  Generalized weakness noted, moves all extremities. Skin: Warm and dry.    Data Reviewed:   CBC: Recent Labs  Lab 07/25/22 1549 07/25/22 2202 07/26/22 0410 07/27/22 0449  WBC 11.2* 7.1 6.9 6.7  NEUTROABS 9.6*  --   --   --   HGB 12.6* 11.6* 11.0* 10.5*  HCT 41.0 36.8* 35.7* 35.3*  MCV 103.3* 102.8* 104.7* 107.6*  PLT 143* 129* 129* 129*     Basic Metabolic Panel: Recent Labs  Lab 07/25/22 1549 07/25/22 2202  07/26/22 0410 07/27/22 0449  NA 142  --  141 143  K 4.5  --  4.5 4.3  CL 103  --  109 110  CO2 23  --  23 23  GLUCOSE 190*  --  129* 71  BUN 43*  --  50* 42*  CREATININE 1.96* 1.97* 1.88* 1.68*  CALCIUM 9.1  --  8.4* 8.3*  MG  --   --   --  2.1  PHOS  --   --   --  3.9     Liver Function Tests: Recent Labs  Lab 07/25/22 1549 07/26/22 0410  AST 45* 79*  ALT 25 36  ALKPHOS 80 69  BILITOT 0.9 0.8  PROT 7.1 5.9*  ALBUMIN 3.1*  2.6*      Radiology Studies: DG Chest Port 1 View  Result Date: 07/25/2022 CLINICAL DATA:  Possible sepsis.  Cough. EXAM: PORTABLE CHEST 1 VIEW COMPARISON:  None Available. FINDINGS: The cardiomediastinal silhouette is unchanged. New RIGHT LOWER lung airspace opacities likely represents pneumonia. There is no evidence of pneumothorax or large pleural effusion. No acute bony abnormalities are identified. IMPRESSION: New RIGHT LOWER lung airspace opacities likely representing pneumonia. Radiographic follow-up to resolution is recommended. Electronically Signed   By: Margarette Canada M.D.   On: 07/25/2022 16:31      LOS: 2 days    Flora Lipps, MD [facility] Available via Epic secure chat 7am-7pm After these hours, please refer to coverage provider listed on [email or web address] 07/27/2022, 12:58 PM

## 2022-07-28 ENCOUNTER — Inpatient Hospital Stay (HOSPITAL_COMMUNITY): Payer: Medicare Other

## 2022-07-28 DIAGNOSIS — J189 Pneumonia, unspecified organism: Secondary | ICD-10-CM | POA: Diagnosis not present

## 2022-07-28 DIAGNOSIS — R0609 Other forms of dyspnea: Secondary | ICD-10-CM | POA: Diagnosis not present

## 2022-07-28 LAB — GLUCOSE, CAPILLARY
Glucose-Capillary: 65 mg/dL — ABNORMAL LOW (ref 70–99)
Glucose-Capillary: 106 mg/dL — ABNORMAL HIGH (ref 70–99)
Glucose-Capillary: 59 mg/dL — ABNORMAL LOW (ref 70–99)
Glucose-Capillary: 99 mg/dL (ref 70–99)
Glucose-Capillary: 112 mg/dL — ABNORMAL HIGH (ref 70–99)
Glucose-Capillary: 109 mg/dL — ABNORMAL HIGH (ref 70–99)

## 2022-07-28 LAB — BASIC METABOLIC PANEL
Anion gap: 9 (ref 5–15)
BUN: 32 mg/dL — ABNORMAL HIGH (ref 8–23)
CO2: 23 mmol/L (ref 22–32)
Calcium: 8.4 mg/dL — ABNORMAL LOW (ref 8.9–10.3)
Chloride: 110 mmol/L (ref 98–111)
Creatinine, Ser: 1.58 mg/dL — ABNORMAL HIGH (ref 0.61–1.24)
GFR, Estimated: 42 mL/min — ABNORMAL LOW (ref >60)
Glucose, Bld: 75 mg/dL (ref 70–99)
Potassium: 4.1 mmol/L (ref 3.5–5.1)
Sodium: 142 mmol/L (ref 135–145)

## 2022-07-28 LAB — ECHOCARDIOGRAM COMPLETE
AR max vel: 1.3 cm2
AV Area VTI: 1.42 cm2
AV Area mean vel: 1.34 cm2
AV Mean grad: 9 mmHg
AV Peak grad: 16.6 mmHg
Ao pk vel: 2.04 m/s
Area-P 1/2: 3.08 cm2
Calc EF: 46.1 %
Height: 69 in
MV M vel: 2.41 m/s
MV Peak grad: 23.1 mmHg
S' Lateral: 3 cm
Single Plane A2C EF: 44.7 %
Single Plane A4C EF: 47.8 %
Weight: 3216.95 oz

## 2022-07-28 LAB — CBC
HCT: 32.1 % — ABNORMAL LOW (ref 39.0–52.0)
Hemoglobin: 9.7 g/dL — ABNORMAL LOW (ref 13.0–17.0)
MCH: 32 pg (ref 26.0–34.0)
MCHC: 30.2 g/dL (ref 30.0–36.0)
MCV: 105.9 fL — ABNORMAL HIGH (ref 80.0–100.0)
Platelets: 121 10*3/uL — ABNORMAL LOW (ref 150–400)
RBC: 3.03 MIL/uL — ABNORMAL LOW (ref 4.22–5.81)
RDW: 14 % (ref 11.5–15.5)
WBC: 5.5 10*3/uL (ref 4.0–10.5)
nRBC: 0 % (ref 0.0–0.2)

## 2022-07-28 LAB — MAGNESIUM: Magnesium: 2.1 mg/dL (ref 1.7–2.4)

## 2022-07-28 MED ORDER — SODIUM CHLORIDE 0.9 % IV SOLN
500.0000 mg | INTRAVENOUS | Status: DC
  Administered 2022-07-28: 500 mg via INTRAVENOUS
  Filled 2022-07-28 (×2): qty 5

## 2022-07-28 MED ORDER — AZITHROMYCIN 250 MG PO TABS: 500.0000 mg | ORAL_TABLET | Freq: Every day | ORAL | Status: DC

## 2022-07-28 MED ORDER — PERFLUTREN LIPID MICROSPHERE
1.0000 mL | INTRAVENOUS | Status: AC | PRN
  Administered 2022-07-28: 3 mL via INTRAVENOUS

## 2022-07-28 MED ORDER — COVID-19 MRNA 2023-2024 VACCINE (COMIRNATY) 0.3 ML INJECTION: 0.3000 mL | Freq: Once | INTRAMUSCULAR | Status: DC

## 2022-07-28 MED ORDER — INSULIN GLARGINE-YFGN 100 UNIT/ML ~~LOC~~ SOLN
14.0000 [IU] | Freq: Every day | SUBCUTANEOUS | Status: DC
  Filled 2022-07-28 (×2): qty 0.14

## 2022-07-28 NOTE — Progress Notes (Signed)
Hypoglycemic Event  CBG: 65  Treatment: 4 oz juice/soda  Symptoms: None  CBG Result:106  Possible Reasons for Event: Unknown  Comments/MD notified: Patient required 2-4oz containers of juice for CBG of 106. MD made aware.   [place]

## 2022-07-28 NOTE — Procedures (Signed)
Modified Barium Swallow Study  Patient Details  Name: [name] [name] MRN: [id number] Date of Birth: [birth year]  Today's Date: 07/28/2022  Modified Barium Swallow completed.  Full report located under Chart Review in the Imaging Section.  History of Present Illness Patient is an 89 y.o. male with PMH: HTN, aortic stenosis status post TAVR, sleep apnea, GERD, diabetes mellitus type 2, CKD stage III, BPH, hiatal hernia (daughter reports it is large but he is not surgical candidate, remote smoker. He presented to the hospital on 07/25/22 after being found on the floor by his wife; unwitnessed fall slipping from chair onto floor. CXR showed right LL PNA.  Swallow eval was done 2/12 and reordered due to pt having coughing with liquids.  MBS advised - and ordered but xray unable to conduct this pm - therefore BSE completed. MBS 07/28/2022.   Clinical Impression Patient presents with moderately severe oropharyngeal dysphagia with sensorimotor deficits.  His oral difficulties are characterized by disorganized and weak lingual control resulting in loss of partial bolus into pharynx/larynx and oral retention.  Pharyngeal dysphagia evidenced by weakness of pharyngeal musculature as well as obstruction due to anterior cervical spine curvature preventing adequate epiglottic deflection.  Aforementioned deficits allow mild to moderate vallecular retention as well as silent aspiration of thin liquids as well as suspected aspiration of nectar in A-P view *suboptimal* as pt overtly coughed post-nectar swallow while leaning to the left.  When returned to A-P view, he had barium in his trachea that was not present prior to A-P view.    In addition, pt is aspirating secretions as barium adheres to secretions.  Various compensations attempted but not effective included effortful swallow, head turn right *retention on right more than left in pharynx in A-P view.   Following solid with nectar thick liquid swallow and cued cough and  expectoration effective to help remove partial retention and penetrates - but not aspirates.  Recommend pt continue to cough and expectorate frequently.  Given results of MBS, if he is coughing with intake - he is aspirating.    Mr Petronio has had dysphagia dating back to at least 2009 per esophagram and information gleaned from BSE 07/27/2022.  His progressive decline with deconditioning has exacerbated baseline dysphagia and his compromised mobility likely contributed to aspiration pna.  Recommend pt diet be Dys2/Nectar and allow thin water via tsp any time *pt did not aspirate thin via tsp)    SLP will follow up for dysphagia management, exercises to improve pharyngeal motility for maximal efficiency and comfort with po intake. Recommend to consider consulting palliative care to help pt establish his [place] given report of progressive decline, weight loss, dysphagia and current illness. Factors that may increase risk of adverse event in presence of aspiration (Hercules 2021): Reduced saliva;Frail or deconditioned;Limited mobility;Reduced cognitive function;Dependence for feeding and/or oral hygiene;Inadequate oral hygiene  Swallow Evaluation Recommendations Recommendations: PO diet PO Diet Recommendation: Dysphagia 2 (Finely chopped);Mildly thick liquids (Level 2, nectar thick) (tsps thin) Liquid Administration via: Cup;Straw (thin via tsp only while acutely sick) Supervision: Staff to assist with self-feeding (family ok to assist) Swallowing strategies  : Slow rate;Small bites/sips;Follow solids with liquids (cough and expectorate prn, stop po if coughing) Oral care recommendations: Oral care before ice chips/water;Use suctioning for oral care Recommended consults: Consider Palliative care Caregiver Recommendations: Have oral suction available     [name], MS [facility] SLP Acute Rehab Services Office [phone]  [name] 07/28/2022,10:19 AM

## 2022-07-28 NOTE — Progress Notes (Addendum)
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 Zadie Rhine ?  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   Appears to have been dealing with severe headaches from shingles for about a month which was intractable his knees apparently buckled beneath him Brought to emergency room on 07/25/2022 Found to be hypoxic 88% and was placed initially on a nonrebreather Lactic acid on admission elevated 4.1, BNP 211 COVID and flu negative CXR = RLL pneumonia  Hospital-Problem based course  Aspiration R sided pneumonia with hypoxia and sepsis present on admission - Sepsis not present on admission - Speech therapy saw the patient recommended dysphagia 3 diet - Continuing at this time azithromycin ceftriaxone can likely narrow antibiotics to orals in am--complete total Abx 1`0 day course  Elevated troponins in the setting of sinus tach [likely type II demand ischemia] -Left heart cath in the past that showed mild nonobstructive CAD with 80% marginal branch stenosis and medical therapy was recommended - Echocardiogram shows EF 40 to 45% global hypokinesis-not a great candidate for invasive stratification  Fall and deconditioning - Will need skilled facility placement  Aortic stenosis with TAVR in the past - See above  AKI on admission superimposed on CKD 3A - Improved from admission, monitor trends IVF saline locked a.m. 2/14  DM TY 2 with hyperglycemia with complication of hypoglycemia this morning - Home dose cut back to 14 units of long-acting continue sliding scale alone -CBGs have improved but he is not eating  Postherpetic neuralgia with intractable pain - Continue oxycodone 1-2 every 6 as needed  moderate pain, gabapentin 300 3 times daily  OSA on CPAP -Continue CPAP at night - Continue oxygen  DVT prophylaxis: Heparin Code Status: DNR confirmed Family Communication: Discussed with son and daughter at the bedside Disposition:  Status is: Inpatient Remains inpatient appropriate because:   Requires de-escalation of antibiotics monitoring of glycemia as there was some hypoglycemia    Subjective:  Little bit sleepy awakens-coherent Still coughing some Is not eating a whole lot-does not like the food and the consistency of dysphagia diet  Objective: Vitals:   07/27/22 2019 07/28/22 0431 07/28/22 0500 07/28/22 0900  BP: 136/61 (!) 125/49    Pulse: 69 69    Resp:  20    Temp: 98.1 F (36.7 C) 98 F (36.7 C)  98.6 F (37 C)  TempSrc: Oral Oral  Axillary  SpO2: 99% 96%    Weight:   91.2 kg   Height:        Intake/Output Summary (Last 24 hours) at 07/28/2022 1251 Last data filed at 07/28/2022 0900 Gross per 24 hour  Intake 1568.83 ml  Output 700 ml  Net 868.83 ml   Filed Weights   07/26/22 0500 07/27/22 0500 07/28/22 0500  Weight: 88 kg 89.3 kg 91.2 kg    Examination:  Frail-appearing white male no distress on oxygen Coarse wheezes rales throughout lung S1-S2 no murmur Abdomen is soft no rebound with umbilical hernia Heel protectors on heels as well as punctate areas on toes No lower extremity edema   Data Reviewed: personally reviewed   CBC    Component Value Date/Time   WBC 5.5 07/28/2022 0534   RBC 3.03 (L) 07/28/2022  0534   HGB 9.7 (L) 07/28/2022 0534   HGB 10.0 (L) 07/13/2018 1455   HCT 32.1 (L) 07/28/2022 0534   HCT 30.0 (L) 07/13/2018 1455   PLT 121 (L) 07/28/2022 0534   PLT 104 (L) 07/13/2018 1455   MCV 105.9 (H) 07/28/2022 0534   MCV 98 (H) 07/13/2018 1455   MCH 32.0 07/28/2022 0534   MCHC 30.2 07/28/2022 0534   RDW 14.0 07/28/2022 0534   RDW 15.8 (H) 07/13/2018 1455   LYMPHSABS 0.4 (L) 07/25/2022 1549   LYMPHSABS 0.8 07/13/2018  1455   MONOABS 1.1 (H) 07/25/2022 1549   EOSABS 0.0 07/25/2022 1549   EOSABS 0.3 07/13/2018 1455   BASOSABS 0.0 07/25/2022 1549   BASOSABS 0.1 07/13/2018 1455      Latest Ref Rng & Units 07/28/2022    5:34 AM 07/27/2022    4:49 AM 07/26/2022    4:10 AM  CMP  Glucose 70 - 99 mg/dL 75  71  129   BUN 8 - 23 mg/dL 32  42  50   Creatinine 0.61 - 1.24 mg/dL 1.58  1.68  1.88   Sodium 135 - 145 mmol/L 142  143  141   Potassium 3.5 - 5.1 mmol/L 4.1  4.3  4.5   Chloride 98 - 111 mmol/L 110  110  109   CO2 22 - 32 mmol/L 23  23  23   $ Calcium 8.9 - 10.3 mg/dL 8.4  8.3  8.4   Total Protein 6.5 - 8.1 g/dL   5.9   Total Bilirubin 0.3 - 1.2 mg/dL   0.8   Alkaline Phos 38 - 126 U/L   69   AST 15 - 41 U/L   79   ALT 0 - 44 U/L   36      Radiology Studies: ECHOCARDIOGRAM COMPLETE  Result Date: 07/28/2022    ECHOCARDIOGRAM REPORT   Patient Name:   [name] [name] Date of Exam: 07/28/2022 Medical Rec #:  [id number]  Height:       69.0 in Accession #:    [id number] Weight:       201.1 lb Date of Birth:  11-06-1931  BSA:          2.071 m Patient Age:    23 years   BP:           136/59 mmHg Patient Gender: M          HR:           76 bpm. Exam Location:  Inpatient Procedure: 2D Echo and Intracardiac Opacification Agent Indications:    dyspnea  History:        Patient has prior history of Echocardiogram examinations, most                 recent 06/01/2019. TAVR; Risk Factors:Hypertension, Dyslipidemia                 and Diabetes.                 Aortic Valve: Sapien prosthetic, stented (TAVR) valve is present                 in the aortic position.  Sonographer:    Harvie Junior Referring Phys: [id number] [place]  Sonographer Comments: Technically difficult study due to poor echo windows and patient is obese. Image acquisition challenging due to patient body habitus and Image acquisition challenging due to respiratory motion. IMPRESSIONS  1. Left ventricular ejection fraction, by estimation,  is 45 to 50%. The left  ventricle has mildly decreased function. The left ventricle demonstrates global hypokinesis. There is mild concentric left ventricular hypertrophy. Left ventricular diastolic parameters are consistent with Grade I diastolic dysfunction (impaired relaxation).  2. Right ventricular systolic function is normal. The right ventricular size is normal.  3. The mitral valve is normal in structure. Trivial mitral valve regurgitation. No evidence of mitral stenosis.  4. The aortic valve has been repaired/replaced. Central Aortic valve regurgitation is mild. No aortic stenosis is present. There is a Sapien prosthetic (TAVR) valve present in the aortic position. Aortic valve area, by VTI measures 1.42 cm. Aortic valve mean gradient measures 9.0 mmHg. Aortic valve Vmax measures 2.04 m/s. DVI 0.41.  5. The inferior vena cava is normal in size with greater than 50% respiratory variability, suggesting right atrial pressure of 3 mmHg.  6. Compared to study dated 06/01/19 there is no significant change FINDINGS  Left Ventricle: Left ventricular ejection fraction, by estimation, is 45 to 50%. The left ventricle has mildly decreased function. The left ventricle demonstrates global hypokinesis. Definity contrast agent was given IV to delineate the left ventricular  endocardial borders. The left ventricular internal cavity size was normal in size. There is mild concentric left ventricular hypertrophy. Abnormal (paradoxical) septal motion, consistent with left bundle branch block. Left ventricular diastolic parameters are consistent with Grade I diastolic dysfunction (impaired relaxation). Normal left ventricular filling pressure. Right Ventricle: The right ventricular size is normal. No increase in right ventricular wall thickness. Right ventricular systolic function is normal. Left Atrium: Left atrial size was normal in size. Right Atrium: Right atrial size was normal in size. Pericardium: There is no evidence of pericardial effusion.  Mitral Valve: The mitral valve is normal in structure. Trivial mitral valve regurgitation. No evidence of mitral valve stenosis. Tricuspid Valve: The tricuspid valve is normal in structure. Tricuspid valve regurgitation is not demonstrated. No evidence of tricuspid stenosis. Aortic Valve: The aortic valve has been repaired/replaced. Aortic valve regurgitation is mild. No aortic stenosis is present. Aortic valve mean gradient measures 9.0 mmHg. Aortic valve peak gradient measures 16.6 mmHg. Aortic valve area, by VTI measures 1.42 cm. There is a Sapien prosthetic, stented (TAVR) valve present in the aortic position. Pulmonic Valve: The pulmonic valve was normal in structure. Pulmonic valve regurgitation is not visualized. No evidence of pulmonic stenosis. Aorta: The aortic root is normal in size and structure. Venous: The inferior vena cava is normal in size with greater than 50% respiratory variability, suggesting right atrial pressure of 3 mmHg. IAS/Shunts: No atrial level shunt detected by color flow Doppler.  LEFT VENTRICLE PLAX 2D LVIDd:         3.90 cm      Diastology LVIDs:         3.00 cm      LV e' medial:    4.68 cm/s LV PW:         1.30 cm      LV E/e' medial:  16.4 LV IVS:        1.40 cm      LV e' lateral:   8.05 cm/s LVOT diam:     2.10 cm      LV E/e' lateral: 9.5 LV SV:         59 LV SV Index:   29 LVOT Area:     3.46 cm  LV Volumes (MOD) LV vol d, MOD A2C: 153.0 ml LV vol d, MOD A4C: 155.0 ml LV vol  s, MOD A2C: 84.6 ml LV vol s, MOD A4C: 80.9 ml LV SV MOD A2C:     68.4 ml LV SV MOD A4C:     155.0 ml LV SV MOD BP:      74.2 ml RIGHT VENTRICLE TAPSE (M-mode): 2.0 cm LEFT ATRIUM             Index LA diam:        3.60 cm 1.74 cm/m LA Vol (A2C):   38.5 ml 18.59 ml/m LA Vol (A4C):   36.0 ml 17.39 ml/m LA Biplane Vol: 37.4 ml 18.06 ml/m  AORTIC VALVE                     PULMONIC VALVE AV Area (Vmax):    1.30 cm      PV Vmax:       0.59 m/s AV Area (Vmean):   1.34 cm      PV Peak grad:  1.4 mmHg AV  Area (VTI):     1.42 cm AV Vmax:           203.50 cm/s AV Vmean:          145.000 cm/s AV VTI:            0.417 m AV Peak Grad:      16.6 mmHg AV Mean Grad:      9.0 mmHg LVOT Vmax:         76.40 cm/s LVOT Vmean:        56.000 cm/s LVOT VTI:          0.171 m LVOT/AV VTI ratio: 0.41 MITRAL VALVE MV Area (PHT): 3.08 cm     SHUNTS MV Decel Time: 246 msec     Systemic VTI:  0.17 m MR Peak grad: 23.1 mmHg     Systemic Diam: 2.10 cm MR Vmax:      240.50 cm/s MV E velocity: 76.70 cm/s MV A velocity: 122.00 cm/s MV E/A ratio:  0.63 Fransico Him MD Electronically signed by Fransico Him MD Signature Date/Time: 07/28/2022/11:06:11 AM    Final    DG Swallowing Func-Speech Pathology  Result Date: 07/28/2022 Table formatting from the original result was not included. Modified Barium Swallow Study Patient Details Name: [name] [name] MRN: [id number] Date of Birth: 1931-07-21 Today's Date: 07/28/2022 HPI/PMH: HPI: Patient is an 89 y.o. male with PMH: HTN, aortic stenosis status post TAVR, sleep apnea, GERD, diabetes mellitus type 2, CKD stage III, BPH, hiatal hernia (daughter reports it is large but he is not surgical candidate, remote smoker. He presented to the hospital on 07/25/22 after being found on the floor by his wife; unwitnessed fall slipping from chair onto floor. CXR showed right LL PNA.  Swallow eval was done 2/12 and reordered due to pt having coughing with liquids.  MBS advised - and ordered but xray unable to conduct this pm - therefore BSE completed. MBS 07/28/2022.  Clinical Impression Patient presents with moderately severe oropharyngeal dysphagia with sensorimotor deficits.  His oral difficulties are characterized by disorganized and weak lingual control resulting in loss of partial bolus into pharynx/larynx and oral retention.  Pharyngeal dysphagia evidenced by weakness of pharyngeal musculature as well as obstruction due to anterior cervical spine curvature preventing adequate epiglottic deflection.   Aforementioned deficits allow mild to moderate vallecular retention as well as silent aspiration of thin liquids as well as suspected aspiration of nectar in A-P view *suboptimal* as pt overtly coughed post-nectar swallow while leaning to the  left.  When returned to A-P view, he had barium in his trachea that was not present prior to A-P view.  In addition, pt is aspirating secretions as barium adheres to secretions.  Various compensations attempted but not effective included effortful swallow, head turn right *retention on right more than left in pharynx in A-P view.   Following solid with nectar thick liquid swallow and cued cough and expectoration effective to help remove partial retention and penetrates - but not aspirates.  Recommend pt continue to cough and expectorate frequently.  Given results of MBS, if he is coughing with intake - he is aspirating.  Mr Stinson has had dysphagia dating back to at least 2009 per esophagram and information gleaned from BSE 07/27/2022.  His progressive decline with deconditioning has exacerbated baseline dysphagia and his compromised mobility likely contributed to aspiration pna.  Recommend pt diet be Dys2/Nectar and allow thin water via tsp any time *pt did not aspirate thin via tsp)  SLP will follow up for dysphagia management, exercises to improve pharyngeal motility for maximal efficiency and comfort with po intake. Recommend to consider consulting palliative care to help pt establish his [place] given report of progressive decline, weight loss, dysphagia and current illness. Factors that may increase risk of adverse event in presence of aspiration ([place] 2021): Factors that may increase risk of adverse event in presence of aspiration ([place] 2021): Reduced saliva; Frail or deconditioned; Limited mobility; Reduced cognitive function; Dependence for feeding and/or oral hygiene; Inadequate oral hygiene Recommendations/Plan: Swallowing Evaluation  Recommendations Swallowing Evaluation Recommendations Recommendations: PO diet PO Diet Recommendation: Dysphagia 2 (Finely chopped); Mildly thick liquids (Level 2, nectar thick) (tsps thin) Liquid Administration via: Cup; Straw (thin via tsp only while acutely sick) Supervision: Staff to assist with self-feeding (family ok to assist) Swallowing strategies  : Slow rate; Small bites/sips; Follow solids with liquids (cough and expectorate prn, stop po if coughing) Oral care recommendations: Oral care before ice chips/water; Use suctioning for oral care Recommended consults: Consider Palliative care Caregiver Recommendations: Have oral suction available Treatment Plan Treatment Plan Treatment recommendations: Therapy as outlined in treatment plan below Follow-up recommendations: Skilled nursing-short term rehab (<3 hours/day) Functional status assessment: Patient has had a recent decline in their functional status and/or demonstrates limited ability to make significant improvements in function in a reasonable and predictable amount of time. Treatment frequency: Min 2x/week Treatment duration: 2 weeks Interventions: Aspiration precaution training; Compensatory techniques; Patient/family education; Respiratory muscle strength training; Diet toleration management by SLP Recommendations Recommendations for follow up therapy are one component of a multi-disciplinary discharge planning process, led by the attending physician.  Recommendations may be updated based on patient status, additional functional criteria and insurance authorization. Assessment: Orofacial Exam: Orofacial Exam Oral Cavity: Oral Hygiene: WFL Oral Cavity - Dentition: Adequate natural dentition Orofacial Anatomy: WFL Oral Motor/Sensory Function: WFL Anatomy: No data recorded Thin Liquids: Thin Liquids (Level 0) Thin Liquids : Impaired Bolus delivery method: Spoon; Cup Thin Liquid - Impairment: Oral Impairment; Pharyngeal impairment Lip Closure: No labial  escape Tongue control during bolus hold: Posterior escape of less than half of bolus Bolus transport/lingual motion: Slow tongue motion Oral residue: Trace residue lining oral structures Location of oral residue : Tongue Initiation of swallow : -- (deep into larynx) Soft palate elevation: Complete Laryngeal elevation: Partial or minimal superior movement and approximation Anterior hyoid excursion: Partial Epiglottic movement: Partial Laryngeal vestibule closure: Incomplete Pharyngeal stripping wave : Present - diminished Pharyngoesophageal segment opening: Partial distention/partial  duration, partial obstruction of flow Tongue base retraction: Narrow column of contrast or air between tongue base and PPW Pharyngeal residue: Collection of residue within or on pharyngeal structures Location of pharyngeal residue: Diffuse (>3 areas) Penetration/Aspiration Scale (PAS) score: 8.  Material enters airway, passes BELOW cords without attempt by patient to eject out (silent aspiration)  Mildly Thick Liquids: Mildly thick liquids (Level 2, nectar thick) Mildly thick liquids (Level 2, nectar thick): Impaired Bolus delivery method: Spoon; Cup; Straw Mildly Thick Liquid - Impairment: Oral Impairment; Pharyngeal impairment; Esophageal impairment Lip Closure: No labial escape Tongue control during bolus hold: Cohesive bolus between tongue to palatal seal Bolus transport/lingual motion: Slow tongue motion Oral residue: Trace residue lining oral structures Location of oral residue : Tongue Initiation of swallow : Pyriform sinuses Soft palate elevation: Partial Laryngeal elevation: Partial or minimal superior movement and approximation Anterior hyoid excursion: Partial Epiglottic movement: Partial Laryngeal vestibule closure: Incomplete Pharyngeal stripping wave : Present - diminished Pharyngoesophageal segment opening: Partial distention/partial duration, partial obstruction of flow Tongue base retraction: Narrow column of contrast or  air between tongue base and PPW Pharyngeal residue: Collection of residue within or on pharyngeal structures Location of pharyngeal residue: Diffuse (>3 areas) Penetration/Aspiration Scale (PAS) score: 3.  Material enters airway, remains ABOVE vocal cords and not ejected out; 7.  Material enters airway, passes BELOW cords and not ejected out despite cough attempt by patient (suspect aspiration in A-P view) Esophageal impairment: Esophageal retention  Moderately Thick Liquids: Moderately thick liquids (Level 3, honey thick) Moderately thick liquids (Level 3, honey thick): Impaired Bolus delivery method: Spoon Lip Closure: No labial escape Tongue control during bolus hold: Not tested Bolus transport/lingual motion: Slow tongue motion; Repetitive/disorganized tongue motion Oral residue: Trace residue lining oral structures Location of oral residue : Tongue Initiation of swallow : Valleculae Soft palate elevation: Complete Laryngeal elevation: Partial or minimal superior movement and approximation Anterior hyoid excursion: Partial Epiglottic movement: Partial Laryngeal vestibule closure: Incomplete Pharyngeal stripping wave : Present - diminished Pharyngoesophageal segment opening: Partial distention/partial duration, partial obstruction of flow Tongue base retraction: Wide column of contrast or air between tongue base and PPW Pharyngeal residue: Trace residue within or on pharyngeal structures Location of pharyngeal residue: Diffuse (>3 areas) Penetration/Aspiration Scale (PAS) score: 1.  Material does not enter airway  Puree: Puree Puree: Impaired Lip Closure: No labial escape Bolus transport/lingual motion: Repetitive/disorganized tongue motion Oral residue: Trace residue lining oral structures Location of oral residue : Palate; Tongue Initiation of swallow: Valleculae Soft palate elevation: Complete Laryngeal elevation: Partial or minimal superior movement and approximation Anterior hyoid excursion: Partial  Epiglottic movement: Partial Laryngeal vestibule closure: Incomplete Pharyngeal stripping wave : Present - complete Pharyngoesophageal segment opening: Complete distension and complete duration, no obstruction of flow Tongue base retraction: Narrow column of contrast or air between tongue base and PPW Pharyngeal residue: Trace residue within or on pharyngeal structures Location of pharyngeal residue: Tongue base Penetration/Aspiration Scale (PAS) score: 1.  Material does not enter airway Esophageal impairment: Esophageal retention Solid: Solid Solid: Impaired Solid - Impairment: Oral Impairment; Pharyngeal impairment Lip Closure: No labial escape Bolus preparation/mastication: Slow prolonged chewing/mashing with complete recollection Oral residue: Residue collection on oral structures Location of oral residue : Tongue Initiation of swallow: Valleculae Soft palate elevation: Complete Laryngeal elevation: Partial or minimal superior movement and approximation Anterior hyoid excursion: Partial Epiglottic movement: Partial Laryngeal vestibule closure: Incomplete Pharyngeal stripping wave : Present - complete Pharyngeal contraction (A/P view only): N/A Pharyngoesophageal segment opening: Partial distention/partial  duration, partial obstruction of flow Tongue base retraction: Wide column of contrast or air between tongue base and PPW Pharyngeal residue: Collection of residue within or on pharyngeal structures Location of pharyngeal residue: Valleculae Penetration/Aspiration Scale (PAS) score: 1.  Material does not enter airway Pill: Pill Pill: Not Tested Compensatory Strategies: Compensatory Strategies Compensatory strategies: Yes Effortful swallow: Ineffective Ineffective Effortful Swallow: Mildly thick liquid (Level 2, nectar thick); Puree Liquid wash: Effective Effective Liquid Wash: Mildly thick liquid (Level 2, nectar thick); Solid (nectar liquid swallow after solid to help with vallecular retention) Right head turn:  Ineffective Ineffective Right Head Turn: Mildly thick liquid (Level 2, nectar thick) Other(comment): Effective Effective Other(comment): Mildly thick liquid (Level 2, nectar thick); Solid; Moderately thick liquid (Level 3, honey thick); Puree; Thin liquid (Level 0) (cough and expectorate)   General Information: No data recorded Diet Prior to this Study: Regular; Thin liquids (Level 0)   Temperature : Normal   Respiratory Status: WFL   Supplemental O2: Nasal cannula   History of Recent Intubation: No  Behavior/Cognition: Alert; Cooperative; Pleasant mood Self-Feeding Abilities: Able to self-feed Baseline vocal quality/speech: Hypophonia/low volume Volitional Cough: Able to elicit Volitional Swallow: Able to elicit Exam Limitations: Poor positioning Goal Planning: Prognosis for improved oropharyngeal function: Fair Barriers to Reach Goals: Time post onset; Severity of deficits; Overall medical prognosis (advanced age) No data recorded Patient/Family Stated Goal: daughter, [name], wants pt to get better and advised to concern for swallowing Consulted and agree with results and recommendations: Patient Pain: Pain Assessment Pain Assessment: Faces Faces Pain Scale: 4 Pain Location: left face - shingles= trigeminal neuralgia Pain Descriptors / Indicators: Stabbing; Penetrating; Pins and needles; Tender; Grimacing; Discomfort Pain Intervention(s): Limited activity within patient's tolerance End of Session: Start Time:SLP Start Time (ACUTE ONLY): [id number] Stop Time: SLP Stop Time (ACUTE ONLY): 1710 Time Calculation:SLP Time Calculation (min) (ACUTE ONLY): 53 min Charges: SLP Evaluations $ SLP Speech Visit: 1 Visit SLP Evaluations $BSS Swallow: 1 Procedure $Swallowing Treatment: 1 Procedure SLP visit diagnosis: SLP Visit Diagnosis: Dysphagia, oropharyngeal phase (R13.12); Dysphagia, pharyngoesophageal phase (R13.14) Past Medical History: Past Medical History: Diagnosis Date  Arthritis of back   BPH (benign prostatic hyperplasia)    CKD (chronic kidney disease) stage 3, GFR 30-59 ml/min (HCC)   Colon polyps   Diabetes mellitus, type 2 ([place])   Diabetic nephropathy ([place])   Diverticulosis   ED (erectile dysfunction)   Gallstones   GERD (gastroesophageal reflux disease)   Glaucoma   Hiatal hernia   History of gout   History of hiatal hernia   Hx of urinary tract infection   Hypercholesterolemia   Hypertension   Left groin hematoma 07/04/2018  Osteoarthritis   left knee  Pneumonia   PVD (peripheral vascular disease) (HCC)   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  26 mm Edwards Sapien 3 transcatheter heart valve placed via percutaneous right transfemoral approach   Severe aortic stenosis   Sleep apnea   Sleep apnea, obstructive  Past Surgical History: Past Surgical History: Procedure Laterality Date  ABDOMINAL AORTIC ANEURYSM REPAIR  1995  APPENDECTOMY    CATARACT EXTRACTION Bilateral 2003  FEMORAL-POPLITEAL BYPASS GRAFT    left  INTRAOPERATIVE TRANSTHORACIC ECHOCARDIOGRAM  07/04/2018  Procedure: INTRAOPERATIVE TRANSTHORACIC ECHOCARDIOGRAM;  Surgeon: Sherren Mocha, MD;  Location: [facility];  Service: Open Heart Surgery;;  LUMBAR SPINE SURGERY  03/2001  REPLACEMENT TOTAL KNEE Bilateral 2004  RIGHT/LEFT HEART CATH AND CORONARY ANGIOGRAPHY N/A 06/13/2018  Procedure: RIGHT/LEFT HEART CATH AND CORONARY ANGIOGRAPHY;  Surgeon: [place],  Ander Slade, MD;  Location: [facility] [facility];  Service: Cardiovascular;  Laterality: N/A;  TEE WITHOUT CARDIOVERSION  07/04/2018  Procedure: TRANSESOPHAGEAL ECHOCARDIOGRAM (TEE);  Surgeon: Sherren Mocha, MD;  Location: [facility];  Service: Open Heart Surgery;;  TOTAL KNEE ARTHROPLASTY    bilateral  TRANSCATHETER AORTIC VALVE REPLACEMENT, TRANSFEMORAL  07/04/2018  TRANSCATHETER AORTIC VALVE REPLACEMENT, TRANSFEMORAL N/A 07/04/2018  Procedure: TRANSCATHETER AORTIC VALVE REPLACEMENT, TRANSFEMORAL;  Surgeon: Sherren Mocha, MD;  Location: [place];  Service: Open Heart Surgery;  Laterality: N/A;  VASECTOMY   [name], MS  [facility] SLP Acute Rehab Services Office [phone] [name] 07/28/2022, 10:21 AM    Scheduled Meds:  amLODipine  10 mg Oral Daily   aspirin  81 mg Oral Once per day on Mon Thu   dorzolamide-timolol  1 drop Left Eye BID   finasteride  5 mg Oral Daily   gabapentin  300 mg Oral TID   heparin injection (subcutaneous)  5,000 Units Subcutaneous Q8H   insulin aspart  0-5 Units Subcutaneous QHS   insulin aspart  0-9 Units Subcutaneous TID WC   insulin glargine-yfgn  14 Units Subcutaneous Daily   multivitamin with minerals  1 tablet Oral Daily   sodium chloride flush  3 mL Intravenous Q12H   Continuous Infusions:  sodium chloride     azithromycin     cefTRIAXone (ROCEPHIN)  IV Stopped (07/27/22 1550)     LOS: 3 days   Time spent: [place], MD [facility] To contact the attending provider between 7A-7P or the covering provider during after hours 7P-7A, please log into the web site [email or web address] and access using universal [place] password for that web site. If you do not have the password, please call the hospital operator.  07/28/2022, 12:51 PM

## 2022-07-28 NOTE — Progress Notes (Signed)
  Echocardiogram 2D Echocardiogram has been performed.  [name] [name] 07/28/2022, 10:48 AM

## 2022-07-28 NOTE — Care Management Important Message (Signed)
Important Message  Patient Details IM Letter given. Name: [name] [name] MRN: [id number] Date of Birth: [birth year]   Medicare Important Message Given:  Yes     Kerin Salen 07/28/2022, 1:10 PM

## 2022-07-29 DIAGNOSIS — J189 Pneumonia, unspecified organism: Secondary | ICD-10-CM | POA: Diagnosis not present

## 2022-07-29 DIAGNOSIS — Z7189 Other specified counseling: Secondary | ICD-10-CM

## 2022-07-29 DIAGNOSIS — B0229 Other postherpetic nervous system involvement: Secondary | ICD-10-CM | POA: Diagnosis not present

## 2022-07-29 LAB — GLUCOSE, CAPILLARY
Glucose-Capillary: 68 mg/dL — ABNORMAL LOW (ref 70–99)
Glucose-Capillary: 79 mg/dL (ref 70–99)
Glucose-Capillary: 78 mg/dL (ref 70–99)
Glucose-Capillary: 91 mg/dL (ref 70–99)
Glucose-Capillary: 101 mg/dL — ABNORMAL HIGH (ref 70–99)

## 2022-07-29 LAB — RENAL FUNCTION PANEL
Albumin: 2.4 g/dL — ABNORMAL LOW (ref 3.5–5.0)
Anion gap: 10 (ref 5–15)
BUN: 25 mg/dL — ABNORMAL HIGH (ref 8–23)
CO2: 24 mmol/L (ref 22–32)
Calcium: 8.7 mg/dL — ABNORMAL LOW (ref 8.9–10.3)
Chloride: 109 mmol/L (ref 98–111)
Creatinine, Ser: 1.49 mg/dL — ABNORMAL HIGH (ref 0.61–1.24)
GFR, Estimated: 45 mL/min — ABNORMAL LOW (ref >60)
Glucose, Bld: 72 mg/dL (ref 70–99)
Phosphorus: 3 mg/dL (ref 2.5–4.6)
Potassium: 4.4 mmol/L (ref 3.5–5.1)
Sodium: 143 mmol/L (ref 135–145)

## 2022-07-29 MED ORDER — DEXTROSE 5 % IV SOLN: INTRAVENOUS | Status: DC

## 2022-07-29 MED ORDER — ORAL CARE MOUTH RINSE
15.0000 mL | OROMUCOSAL | Status: DC
  Administered 2022-07-29 – 2022-08-02 (×15): 15 mL via OROMUCOSAL

## 2022-07-29 MED ORDER — MORPHINE SULFATE (PF) 2 MG/ML IV SOLN
1.0000 mg | Freq: Four times a day (QID) | INTRAVENOUS | Status: DC | PRN
  Administered 2022-07-29: 1 mg via INTRAVENOUS
  Filled 2022-07-29 (×2): qty 1

## 2022-07-29 MED ORDER — MORPHINE SULFATE (PF) 2 MG/ML IV SOLN: 1.0000 mg | INTRAVENOUS | Status: DC | PRN

## 2022-07-29 MED ORDER — ADULT MULTIVITAMIN W/MINERALS CH
1.0000 | ORAL_TABLET | Freq: Every day | ORAL | Status: DC
  Administered 2022-07-30 – 2022-08-03 (×5): 1 via ORAL
  Filled 2022-07-29 (×5): qty 1

## 2022-07-29 MED ORDER — GABAPENTIN 100 MG PO CAPS
100.0000 mg | ORAL_CAPSULE | Freq: Three times a day (TID) | ORAL | Status: DC
  Administered 2022-07-29 – 2022-07-30 (×3): 100 mg via ORAL
  Filled 2022-07-29 (×3): qty 1

## 2022-07-29 MED ORDER — PIPERACILLIN-TAZOBACTAM 3.375 G IVPB
3.3750 g | Freq: Three times a day (TID) | INTRAVENOUS | Status: DC
  Administered 2022-07-29 – 2022-08-02 (×12): 3.375 g via INTRAVENOUS
  Filled 2022-07-29 (×12): qty 50

## 2022-07-29 MED ORDER — ORAL CARE MOUTH RINSE: 15.0000 mL | OROMUCOSAL | Status: DC | PRN

## 2022-07-29 MED ORDER — LEVOFLOXACIN 250 MG PO TABS
250.0000 mg | ORAL_TABLET | Freq: Every day | ORAL | Status: DC
  Filled 2022-07-29: qty 1

## 2022-07-29 NOTE — Progress Notes (Signed)
Physical Therapy Treatment Patient Details Name: [name] [name] MRN: [id number] DOB: Oct 11, 1931 Today's Date: 07/29/2022   History of Present Illness Patient is a 89 years old male with past medical history of hypertension, aortic stenosis status post TAVR, sleep apnea, GERD, diabetes mellitus type 2, CKD stage III, BPH was brought into the hospital after being found on the floor by his wife after slipping from the chair and was on the ground for a while, it was an unwitnessed fall. X-ray of the chest showed right lower lobe pneumonia.    PT Comments    Today's PT treatment focused on LE therapeutic exercises for promotion of strength and functional bed mobility. Pt MAX-total A+2 for bed mobility with quick fatigue while seated EOB leading to regression in seated balance- pt with forward head, rounded shoulders, heavy anterior and L lateral lean- able to lift head to forward gaze with verbal cuing, but unable to maintain. O2 sat down to 87-88% on 6 L [place] and took several minutes to recover as patient is a heavy mouth breather. Encouraged family to maintain pt arousal during day as able and for performance of therapeutic exercises outside of therapy sessions. Pt will benefit from continued skilled PT to increase his independence and maximize safety with mobility.      Recommendations for follow up therapy are one component of a multi-disciplinary discharge planning process, led by the attending physician.  Recommendations may be updated based on patient status, additional functional criteria and insurance authorization.  Follow Up Recommendations  Skilled nursing-short term rehab (<3 hours/day) Can patient physically be transported by private vehicle: No   Assistance Recommended at Discharge Frequent or constant Supervision/Assistance  Patient can return home with the following Assistance with cooking/housework;Assist for transportation;Two people to help with walking and/or transfers;Two people to  help with bathing/dressing/bathroom;Help with stairs or ramp for entrance   Equipment Recommendations  None recommended by PT    Recommendations for Other Services       Precautions / Restrictions Precautions Precautions: Fall Precaution Comments: Aspiration. Severe left eye pain. Restrictions Weight Bearing Restrictions: No     Mobility  Bed Mobility Overal bed mobility: Needs Assistance Bed Mobility: Supine to Sit, Sit to Supine Rolling: Max assist, +2 for physical assistance   Supine to sit: Total assist, +2 for physical assistance     General bed mobility comments: Transferred to edge of bed in attempts to improve arousal. Needing physical assist to maintain sitting balance due to kyphotic posture, anterior leaning, and heavy L lateral lean. Fatigued quickly.    Transfers                        Ambulation/Gait                   Stairs             Wheelchair Mobility    Modified Rankin (Stroke Patients Only)       Balance Overall balance assessment: Needs assistance Sitting-balance support: No upper extremity supported, Feet supported Sitting balance-Leahy Scale: Poor                                      Cognition Arousal/Alertness: Awake/alert Behavior During Therapy: WFL for tasks assessed/performed Overall Cognitive Status: Difficult to assess  General Comments: Improved alertness and can follow commands and answer simple questions        Exercises General Exercises - Lower Extremity Ankle Circles/Pumps: AROM, Both, 5 reps, Supine Heel Slides: AROM, Both, 10 reps, Supine Straight Leg Raises: AROM, AAROM, Both, 10 reps, Supine Other Exercises Other Exercises: Fist pumps, wrist wags, elbow flexion and shoulder flexion reps requiring various levels of assistance due to arousal. Predominantly x 10 each arm.    General Comments        Pertinent Vitals/Pain Pain  Assessment Pain Assessment: Faces Faces Pain Scale: Hurts even more Pain Location: left face - shingles= trigeminal neuralgia Pain Descriptors / Indicators: Stabbing, Penetrating, Pins and needles, Tender, Grimacing, Discomfort Pain Intervention(s): Limited activity within patient's tolerance    Home Living                          Prior Function            PT Goals (current goals can now be found in the care plan section) Acute Rehab PT Goals Patient Stated Goal: patient and wife prefer home. daughter feels they need more assistance PT Goal Formulation: With family Time For Goal Achievement: 08/09/22 Potential to Achieve Goals: Fair Progress towards PT goals: Progressing toward goals    Frequency    Min 2X/week      PT Plan Current plan remains appropriate    Co-evaluation PT/OT/SLP Co-Evaluation/Treatment: Yes Reason for Co-Treatment: For patient/therapist safety;To address functional/ADL transfers PT goals addressed during session: Mobility/safety with mobility OT goals addressed during session: Strengthening/ROM      AM-PAC PT "6 Clicks" Mobility   Outcome Measure  Help needed turning from your back to your side while in a flat bed without using bedrails?: A Lot Help needed moving from lying on your back to sitting on the side of a flat bed without using bedrails?: Total Help needed moving to and from a bed to a chair (including a wheelchair)?: Total Help needed standing up from a chair using your arms (e.g., wheelchair or bedside chair)?: Total Help needed to walk in hospital room?: Total Help needed climbing 3-5 steps with a railing? : Total 6 Click Score: 7    End of Session   Activity Tolerance: Patient limited by fatigue;Patient limited by lethargy Patient left: in bed;with call bell/phone within reach;with bed alarm set;with family/visitor present Nurse Communication: Mobility status;Other (comment) (O2 sats) PT Visit Diagnosis: History of  falling (Z91.81);Pain;Difficulty in walking, not elsewhere classified (R26.2);Muscle weakness (generalized) (M62.81) Pain - Right/Left: Left Pain - part of body:  (head)     Time: [id number] PT Time Calculation (min) (ACUTE ONLY): 32 min  Charges:  $Therapeutic Activity: 8-22 mins                    Festus Barren PT, DPT  Acute Rehabilitation Services  Office [phone]  07/29/2022, 3:24 PM

## 2022-07-29 NOTE — Progress Notes (Addendum)
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 Zadie [name] ?  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  Brought to emergency room on 07/25/2022 Found to be hypoxic 88% and was placed initially on a nonrebreather Lactic acid on admission elevated 4.1, BNP 211 COVID and flu negative CXR = RLL pneumonia  2/15: Less responsive?  Meds  Hospital-Problem based course  Toxic metabolic encephalopathy [id number] - Discontinue oxycodone, cut back gabapentin 300-->100 3 times daily -Component of hypoglycemia therefore start D5 75 cc/H  Aspiration R sided pneumonia with hypoxia and sepsis present on admission - Sepsis not present on admission - Speech therapy saw the patient recommended dysphagia 3 diet - azithromycin /ceftriaxone--> Zosyn 2/15 as worsening oxygenation and clinical state  Elevated troponins in the setting of sinus tach [likely type II demand ischemia] -Left heart cath in the past that showed mild nonobstructive CAD with 80% marginal branch stenosis and medical therapy was recommended at that time only as he was not a good catheterization candidate - Echocardiogram shows EF 40 to 45% global hypokinesis-not a great candidate for invasive stratification--- will hold on cardiology evaluation  Fall and deconditioning - Unclear if good appropriate candidate for skilled at this time -Will elicit palliative care consult-would benefit probably more from hospice referral  Aortic stenosis with TAVR in the past - See above, continue aspirin 81 twice  weekly  AKI on admission superimposed on CKD 3A - Improved from admission, monitor trends IVF had to be resumed on 2/15 as he is not eating  DM TY 2 with hyperglycemia with complication of hypoglycemia this morning - Discontinued completely long-acting insulin -CBGs continue to be low-patient will be discharged without glycemic control as this is more risky  Postherpetic neuralgia with intractable pain - See above discussion- -Will need to balance sedation with meds versus pain from lack of meds  OSA on CPAP -Continue CPAP at night - Continue oxygen  DVT prophylaxis: Heparin Code Status: DNR confirmed Family Communication: Discussed with son at the bedside today-patient has made a decline-I have explained the difference between palliative care hospice and full scope management-I think that this patient is hospice eligible given his decline Appreciative of palliative care consult and family meeting, let me know and I can be made available Disposition:  Status is: Inpatient Remains inpatient appropriate because:   Get chaplain to see for HCPOA paperwork    Subjective:  Very sleepy Today has not eaten blood sugars in the 60s this morning at around 830 I had a discussion with the patient's son at the bedside that his continued decline portends poorly consulting pallor  Objective: Vitals:   07/29/22 0439 07/29/22 1000 07/29/22 1142 07/29/22 1211  BP: (!) 131/55 (!) 144/63  (!) 112/55  Pulse: 77  73 72  Resp:  15 13 12  $ Temp: 98.6 F (37 C)   98.7 F (37.1 C)  TempSrc: Oral   Oral  SpO2: 93%  95% 95%  Weight: 92.9 kg     Height:        Intake/Output Summary (Last 24 hours)  at 07/29/2022 1416 Last data filed at 07/29/2022 0300 Gross per 24 hour  Intake 419.83 ml  Output 800 ml  Net -380.17 ml    Filed Weights   07/27/22 0500 07/28/22 0500 07/29/22 0439  Weight: 89.3 kg 91.2 kg 92.9 kg    Examination:  Frail-appearing sleepy on oxygen Coarse wheezes rales  throughout lung S1-S2 no murmur, seems sinus on exam Abdomen is soft--has umbilical hernia Heel protectors on heels as well as punctate areas on toes No lower extremity edema   Data Reviewed: personally reviewed   CBC    Component Value Date/Time   WBC 5.5 07/28/2022 0534   RBC 3.03 (L) 07/28/2022 0534   HGB 9.7 (L) 07/28/2022 0534   HGB 10.0 (L) 07/13/2018 1455   HCT 32.1 (L) 07/28/2022 0534   HCT 30.0 (L) 07/13/2018 1455   PLT 121 (L) 07/28/2022 0534   PLT 104 (L) 07/13/2018 1455   MCV 105.9 (H) 07/28/2022 0534   MCV 98 (H) 07/13/2018 1455   MCH 32.0 07/28/2022 0534   MCHC 30.2 07/28/2022 0534   RDW 14.0 07/28/2022 0534   RDW 15.8 (H) 07/13/2018 1455   LYMPHSABS 0.4 (L) 07/25/2022 1549   LYMPHSABS 0.8 07/13/2018 1455   MONOABS 1.1 (H) 07/25/2022 1549   EOSABS 0.0 07/25/2022 1549   EOSABS 0.3 07/13/2018 1455   BASOSABS 0.0 07/25/2022 1549   BASOSABS 0.1 07/13/2018 1455      Latest Ref Rng & Units 07/29/2022    5:00 AM 07/28/2022    5:34 AM 07/27/2022    4:49 AM  CMP  Glucose 70 - 99 mg/dL 72  75  71   BUN 8 - 23 mg/dL 25  32  42   Creatinine 0.61 - 1.24 mg/dL 1.49  1.58  1.68   Sodium 135 - 145 mmol/L 143  142  143   Potassium 3.5 - 5.1 mmol/L 4.4  4.1  4.3   Chloride 98 - 111 mmol/L 109  110  110   CO2 22 - 32 mmol/L 24  23  23   $ Calcium 8.9 - 10.3 mg/dL 8.7  8.4  8.3      Radiology Studies: ECHOCARDIOGRAM COMPLETE  Result Date: 07/28/2022    ECHOCARDIOGRAM REPORT   Patient Name:   [name] [name] Date of Exam: 07/28/2022 Medical Rec #:  [id number]  Height:       69.0 in Accession #:    [id number] Weight:       201.1 lb Date of Birth:  23-Dec-1931  BSA:          2.071 m Patient Age:    77 years   BP:           136/59 mmHg Patient Gender: M          HR:           76 bpm. Exam Location:  Inpatient Procedure: 2D Echo and Intracardiac Opacification Agent Indications:    dyspnea  History:        Patient has prior history of Echocardiogram examinations, most                  recent 06/01/2019. TAVR; Risk Factors:Hypertension, Dyslipidemia                 and Diabetes.                 Aortic Valve: Sapien prosthetic, stented (TAVR) valve is present  in the aortic position.  Sonographer:    Harvie Junior Referring Phys: [id number] [place]  Sonographer Comments: Technically difficult study due to poor echo windows and patient is obese. Image acquisition challenging due to patient body habitus and Image acquisition challenging due to respiratory motion. IMPRESSIONS  1. Left ventricular ejection fraction, by estimation, is 45 to 50%. The left ventricle has mildly decreased function. The left ventricle demonstrates global hypokinesis. There is mild concentric left ventricular hypertrophy. Left ventricular diastolic parameters are consistent with Grade I diastolic dysfunction (impaired relaxation).  2. Right ventricular systolic function is normal. The right ventricular size is normal.  3. The mitral valve is normal in structure. Trivial mitral valve regurgitation. No evidence of mitral stenosis.  4. The aortic valve has been repaired/replaced. Central Aortic valve regurgitation is mild. No aortic stenosis is present. There is a Sapien prosthetic (TAVR) valve present in the aortic position. Aortic valve area, by VTI measures 1.42 cm. Aortic valve mean gradient measures 9.0 mmHg. Aortic valve Vmax measures 2.04 m/s. DVI 0.41.  5. The inferior vena cava is normal in size with greater than 50% respiratory variability, suggesting right atrial pressure of 3 mmHg.  6. Compared to study dated 06/01/19 there is no significant change FINDINGS  Left Ventricle: Left ventricular ejection fraction, by estimation, is 45 to 50%. The left ventricle has mildly decreased function. The left ventricle demonstrates global hypokinesis. Definity contrast agent was given IV to delineate the left ventricular  endocardial borders. The left ventricular internal cavity size was normal in size. There is  mild concentric left ventricular hypertrophy. Abnormal (paradoxical) septal motion, consistent with left bundle branch block. Left ventricular diastolic parameters are consistent with Grade I diastolic dysfunction (impaired relaxation). Normal left ventricular filling pressure. Right Ventricle: The right ventricular size is normal. No increase in right ventricular wall thickness. Right ventricular systolic function is normal. Left Atrium: Left atrial size was normal in size. Right Atrium: Right atrial size was normal in size. Pericardium: There is no evidence of pericardial effusion. Mitral Valve: The mitral valve is normal in structure. Trivial mitral valve regurgitation. No evidence of mitral valve stenosis. Tricuspid Valve: The tricuspid valve is normal in structure. Tricuspid valve regurgitation is not demonstrated. No evidence of tricuspid stenosis. Aortic Valve: The aortic valve has been repaired/replaced. Aortic valve regurgitation is mild. No aortic stenosis is present. Aortic valve mean gradient measures 9.0 mmHg. Aortic valve peak gradient measures 16.6 mmHg. Aortic valve area, by VTI measures 1.42 cm. There is a Sapien prosthetic, stented (TAVR) valve present in the aortic position. Pulmonic Valve: The pulmonic valve was normal in structure. Pulmonic valve regurgitation is not visualized. No evidence of pulmonic stenosis. Aorta: The aortic root is normal in size and structure. Venous: The inferior vena cava is normal in size with greater than 50% respiratory variability, suggesting right atrial pressure of 3 mmHg. IAS/Shunts: No atrial level shunt detected by color flow Doppler.  LEFT VENTRICLE PLAX 2D LVIDd:         3.90 cm      Diastology LVIDs:         3.00 cm      LV e' medial:    4.68 cm/s LV PW:         1.30 cm      LV E/e' medial:  16.4 LV IVS:        1.40 cm      LV e' lateral:   8.05 cm/s LVOT diam:     2.10  cm      LV E/e' lateral: 9.5 LV SV:         59 LV SV Index:   29 LVOT Area:     3.46 cm   LV Volumes (MOD) LV vol d, MOD A2C: 153.0 ml LV vol d, MOD A4C: 155.0 ml LV vol s, MOD A2C: 84.6 ml LV vol s, MOD A4C: 80.9 ml LV SV MOD A2C:     68.4 ml LV SV MOD A4C:     155.0 ml LV SV MOD BP:      74.2 ml RIGHT VENTRICLE TAPSE (M-mode): 2.0 cm LEFT ATRIUM             Index LA diam:        3.60 cm 1.74 cm/m LA Vol (A2C):   38.5 ml 18.59 ml/m LA Vol (A4C):   36.0 ml 17.39 ml/m LA Biplane Vol: 37.4 ml 18.06 ml/m  AORTIC VALVE                     PULMONIC VALVE AV Area (Vmax):    1.30 cm      PV Vmax:       0.59 m/s AV Area (Vmean):   1.34 cm      PV Peak grad:  1.4 mmHg AV Area (VTI):     1.42 cm AV Vmax:           203.50 cm/s AV Vmean:          145.000 cm/s AV VTI:            0.417 m AV Peak Grad:      16.6 mmHg AV Mean Grad:      9.0 mmHg LVOT Vmax:         76.40 cm/s LVOT Vmean:        56.000 cm/s LVOT VTI:          0.171 m LVOT/AV VTI ratio: 0.41 MITRAL VALVE MV Area (PHT): 3.08 cm     SHUNTS MV Decel Time: 246 msec     Systemic VTI:  0.17 m MR Peak grad: 23.1 mmHg     Systemic Diam: 2.10 cm MR Vmax:      240.50 cm/s MV E velocity: 76.70 cm/s MV A velocity: 122.00 cm/s MV E/A ratio:  0.63 Fransico Him MD Electronically signed by Fransico Him MD Signature Date/Time: 07/28/2022/11:06:11 AM    Final    DG Swallowing Func-Speech Pathology  Result Date: 07/28/2022 Table formatting from the original result was not included. Modified Barium Swallow Study Patient Details Name: Schon Etheredge MRN: [id number] Date of Birth: [birth year] Today's Date: 07/28/2022 HPI/PMH: HPI: Patient is an 89 y.o. male with PMH: HTN, aortic stenosis status post TAVR, sleep apnea, GERD, diabetes mellitus type 2, CKD stage III, BPH, hiatal hernia (daughter reports it is large but he is not surgical candidate, remote smoker. He presented to the hospital on 07/25/22 after being found on the floor by his wife; unwitnessed fall slipping from chair onto floor. CXR showed right LL PNA.  Swallow eval was done 2/12 and reordered due to pt having  coughing with liquids.  MBS advised - and ordered but xray unable to conduct this pm - therefore BSE completed. MBS 07/28/2022.  Clinical Impression Patient presents with moderately severe oropharyngeal dysphagia with sensorimotor deficits.  His oral difficulties are characterized by disorganized and weak lingual control resulting in loss of partial bolus into pharynx/larynx and oral retention.  Pharyngeal dysphagia evidenced by weakness  of pharyngeal musculature as well as obstruction due to anterior cervical spine curvature preventing adequate epiglottic deflection.  Aforementioned deficits allow mild to moderate vallecular retention as well as silent aspiration of thin liquids as well as suspected aspiration of nectar in A-P view *suboptimal* as pt overtly coughed post-nectar swallow while leaning to the left.  When returned to A-P view, he had barium in his trachea that was not present prior to A-P view.  In addition, pt is aspirating secretions as barium adheres to secretions.  Various compensations attempted but not effective included effortful swallow, head turn right *retention on right more than left in pharynx in A-P view.   Following solid with nectar thick liquid swallow and cued cough and expectoration effective to help remove partial retention and penetrates - but not aspirates.  Recommend pt continue to cough and expectorate frequently.  Given results of MBS, if he is coughing with intake - he is aspirating.  Mr Lefrancois has had dysphagia dating back to at least 2009 per esophagram and information gleaned from BSE 07/27/2022.  His progressive decline with deconditioning has exacerbated baseline dysphagia and his compromised mobility likely contributed to aspiration pna.  Recommend pt diet be Dys2/Nectar and allow thin water via tsp any time *pt did not aspirate thin via tsp)  SLP will follow up for dysphagia management, exercises to improve pharyngeal motility for maximal efficiency and comfort with po  intake. Recommend to consider consulting palliative care to help pt establish his [place] given report of progressive decline, weight loss, dysphagia and current illness. Factors that may increase risk of adverse event in presence of aspiration ([place] 2021): Factors that may increase risk of adverse event in presence of aspiration ([place] 2021): Reduced saliva; Frail or deconditioned; Limited mobility; Reduced cognitive function; Dependence for feeding and/or oral hygiene; Inadequate oral hygiene Recommendations/Plan: Swallowing Evaluation Recommendations Swallowing Evaluation Recommendations Recommendations: PO diet PO Diet Recommendation: Dysphagia 2 (Finely chopped); Mildly thick liquids (Level 2, nectar thick) (tsps thin) Liquid Administration via: Cup; Straw (thin via tsp only while acutely sick) Supervision: Staff to assist with self-feeding (family ok to assist) Swallowing strategies  : Slow rate; Small bites/sips; Follow solids with liquids (cough and expectorate prn, stop po if coughing) Oral care recommendations: Oral care before ice chips/water; Use suctioning for oral care Recommended consults: Consider Palliative care Caregiver Recommendations: Have oral suction available Treatment Plan Treatment Plan Treatment recommendations: Therapy as outlined in treatment plan below Follow-up recommendations: Skilled nursing-short term rehab (<3 hours/day) Functional status assessment: Patient has had a recent decline in their functional status and/or demonstrates limited ability to make significant improvements in function in a reasonable and predictable amount of time. Treatment frequency: Min 2x/week Treatment duration: 2 weeks Interventions: Aspiration precaution training; Compensatory techniques; Patient/family education; Respiratory muscle strength training; Diet toleration management by SLP Recommendations Recommendations for follow up therapy are one component of a multi-disciplinary  discharge planning process, led by the attending physician.  Recommendations may be updated based on patient status, additional functional criteria and insurance authorization. Assessment: Orofacial Exam: Orofacial Exam Oral Cavity: Oral Hygiene: WFL Oral Cavity - Dentition: Adequate natural dentition Orofacial Anatomy: WFL Oral Motor/Sensory Function: WFL Anatomy: No data recorded Thin Liquids: Thin Liquids (Level 0) Thin Liquids : Impaired Bolus delivery method: Spoon; Cup Thin Liquid - Impairment: Oral Impairment; Pharyngeal impairment Lip Closure: No labial escape Tongue control during bolus hold: Posterior escape of less than half of bolus Bolus transport/lingual motion: Slow tongue motion Oral residue:  Trace residue lining oral structures Location of oral residue : Tongue Initiation of swallow : -- (deep into larynx) Soft palate elevation: Complete Laryngeal elevation: Partial or minimal superior movement and approximation Anterior hyoid excursion: Partial Epiglottic movement: Partial Laryngeal vestibule closure: Incomplete Pharyngeal stripping wave : Present - diminished Pharyngoesophageal segment opening: Partial distention/partial duration, partial obstruction of flow Tongue base retraction: Narrow column of contrast or air between tongue base and PPW Pharyngeal residue: Collection of residue within or on pharyngeal structures Location of pharyngeal residue: Diffuse (>3 areas) Penetration/Aspiration Scale (PAS) score: 8.  Material enters airway, passes BELOW cords without attempt by patient to eject out (silent aspiration)  Mildly Thick Liquids: Mildly thick liquids (Level 2, nectar thick) Mildly thick liquids (Level 2, nectar thick): Impaired Bolus delivery method: Spoon; Cup; Straw Mildly Thick Liquid - Impairment: Oral Impairment; Pharyngeal impairment; Esophageal impairment Lip Closure: No labial escape Tongue control during bolus hold: Cohesive bolus between tongue to palatal seal Bolus  transport/lingual motion: Slow tongue motion Oral residue: Trace residue lining oral structures Location of oral residue : Tongue Initiation of swallow : Pyriform sinuses Soft palate elevation: Partial Laryngeal elevation: Partial or minimal superior movement and approximation Anterior hyoid excursion: Partial Epiglottic movement: Partial Laryngeal vestibule closure: Incomplete Pharyngeal stripping wave : Present - diminished Pharyngoesophageal segment opening: Partial distention/partial duration, partial obstruction of flow Tongue base retraction: Narrow column of contrast or air between tongue base and PPW Pharyngeal residue: Collection of residue within or on pharyngeal structures Location of pharyngeal residue: Diffuse (>3 areas) Penetration/Aspiration Scale (PAS) score: 3.  Material enters airway, remains ABOVE vocal cords and not ejected out; 7.  Material enters airway, passes BELOW cords and not ejected out despite cough attempt by patient (suspect aspiration in A-P view) Esophageal impairment: Esophageal retention  Moderately Thick Liquids: Moderately thick liquids (Level 3, honey thick) Moderately thick liquids (Level 3, honey thick): Impaired Bolus delivery method: Spoon Lip Closure: No labial escape Tongue control during bolus hold: Not tested Bolus transport/lingual motion: Slow tongue motion; Repetitive/disorganized tongue motion Oral residue: Trace residue lining oral structures Location of oral residue : Tongue Initiation of swallow : Valleculae Soft palate elevation: Complete Laryngeal elevation: Partial or minimal superior movement and approximation Anterior hyoid excursion: Partial Epiglottic movement: Partial Laryngeal vestibule closure: Incomplete Pharyngeal stripping wave : Present - diminished Pharyngoesophageal segment opening: Partial distention/partial duration, partial obstruction of flow Tongue base retraction: Wide column of contrast or air between tongue base and PPW Pharyngeal residue:  Trace residue within or on pharyngeal structures Location of pharyngeal residue: Diffuse (>3 areas) Penetration/Aspiration Scale (PAS) score: 1.  Material does not enter airway  Puree: Puree Puree: Impaired Lip Closure: No labial escape Bolus transport/lingual motion: Repetitive/disorganized tongue motion Oral residue: Trace residue lining oral structures Location of oral residue : Palate; Tongue Initiation of swallow: Valleculae Soft palate elevation: Complete Laryngeal elevation: Partial or minimal superior movement and approximation Anterior hyoid excursion: Partial Epiglottic movement: Partial Laryngeal vestibule closure: Incomplete Pharyngeal stripping wave : Present - complete Pharyngoesophageal segment opening: Complete distension and complete duration, no obstruction of flow Tongue base retraction: Narrow column of contrast or air between tongue base and PPW Pharyngeal residue: Trace residue within or on pharyngeal structures Location of pharyngeal residue: Tongue base Penetration/Aspiration Scale (PAS) score: 1.  Material does not enter airway Esophageal impairment: Esophageal retention Solid: Solid Solid: Impaired Solid - Impairment: Oral Impairment; Pharyngeal impairment Lip Closure: No labial escape Bolus preparation/mastication: Slow prolonged chewing/mashing with complete recollection Oral residue: Residue collection  on oral structures Location of oral residue : Tongue Initiation of swallow: Valleculae Soft palate elevation: Complete Laryngeal elevation: Partial or minimal superior movement and approximation Anterior hyoid excursion: Partial Epiglottic movement: Partial Laryngeal vestibule closure: Incomplete Pharyngeal stripping wave : Present - complete Pharyngeal contraction (A/P view only): N/A Pharyngoesophageal segment opening: Partial distention/partial duration, partial obstruction of flow Tongue base retraction: Wide column of contrast or air between tongue base and PPW Pharyngeal residue:  Collection of residue within or on pharyngeal structures Location of pharyngeal residue: Valleculae Penetration/Aspiration Scale (PAS) score: 1.  Material does not enter airway Pill: Pill Pill: Not Tested Compensatory Strategies: Compensatory Strategies Compensatory strategies: Yes Effortful swallow: Ineffective Ineffective Effortful Swallow: Mildly thick liquid (Level 2, nectar thick); Puree Liquid wash: Effective Effective Liquid Wash: Mildly thick liquid (Level 2, nectar thick); Solid (nectar liquid swallow after solid to help with vallecular retention) Right head turn: Ineffective Ineffective Right Head Turn: Mildly thick liquid (Level 2, nectar thick) Other(comment): Effective Effective Other(comment): Mildly thick liquid (Level 2, nectar thick); Solid; Moderately thick liquid (Level 3, honey thick); Puree; Thin liquid (Level 0) (cough and expectorate)   General Information: No data recorded Diet Prior to this Study: Regular; Thin liquids (Level 0)   Temperature : Normal   Respiratory Status: WFL   Supplemental O2: Nasal cannula   History of Recent Intubation: No  Behavior/Cognition: Alert; Cooperative; Pleasant mood Self-Feeding Abilities: Able to self-feed Baseline vocal quality/speech: Hypophonia/low volume Volitional Cough: Able to elicit Volitional Swallow: Able to elicit Exam Limitations: Poor positioning Goal Planning: Prognosis for improved oropharyngeal function: Fair Barriers to Reach Goals: Time post onset; Severity of deficits; Overall medical prognosis (advanced age) No data recorded Patient/Family Stated Goal: daughter, [name], wants pt to get better and advised to concern for swallowing Consulted and agree with results and recommendations: Patient Pain: Pain Assessment Pain Assessment: Faces Faces Pain Scale: 4 Pain Location: left face - shingles= trigeminal neuralgia Pain Descriptors / Indicators: Stabbing; Penetrating; Pins and needles; Tender; Grimacing; Discomfort Pain Intervention(s):  Limited activity within patient's tolerance End of Session: Start Time:SLP Start Time (ACUTE ONLY): [id number] Stop Time: SLP Stop Time (ACUTE ONLY): 1710 Time Calculation:SLP Time Calculation (min) (ACUTE ONLY): 53 min Charges: SLP Evaluations $ SLP Speech Visit: 1 Visit SLP Evaluations $BSS Swallow: 1 Procedure $Swallowing Treatment: 1 Procedure SLP visit diagnosis: SLP Visit Diagnosis: Dysphagia, oropharyngeal phase (R13.12); Dysphagia, pharyngoesophageal phase (R13.14) Past Medical History: Past Medical History: Diagnosis Date  Arthritis of back   BPH (benign prostatic hyperplasia)   CKD (chronic kidney disease) stage 3, GFR 30-59 ml/min (HCC)   Colon polyps   Diabetes mellitus, type 2 ([place])   Diabetic nephropathy ([place])   Diverticulosis   ED (erectile dysfunction)   Gallstones   GERD (gastroesophageal reflux disease)   Glaucoma   Hiatal hernia   History of gout   History of hiatal hernia   Hx of urinary tract infection   Hypercholesterolemia   Hypertension   Left groin hematoma 07/04/2018  Osteoarthritis   left knee  Pneumonia   PVD (peripheral vascular disease) (HCC)   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  26 mm Edwards Sapien 3 transcatheter heart valve placed via percutaneous right transfemoral approach   Severe aortic stenosis   Sleep apnea   Sleep apnea, obstructive  Past Surgical History: Past Surgical History: Procedure Laterality Date  ABDOMINAL AORTIC ANEURYSM REPAIR  1995  APPENDECTOMY    CATARACT EXTRACTION Bilateral 2003  FEMORAL-POPLITEAL BYPASS GRAFT    left  INTRAOPERATIVE  TRANSTHORACIC ECHOCARDIOGRAM  07/04/2018  Procedure: INTRAOPERATIVE TRANSTHORACIC ECHOCARDIOGRAM;  Surgeon: Sherren Mocha, MD;  Location: [place];  Service: Open Heart Surgery;;  LUMBAR SPINE SURGERY  03/2001  REPLACEMENT TOTAL KNEE Bilateral 2004  RIGHT/LEFT HEART CATH AND CORONARY ANGIOGRAPHY N/A 06/13/2018  Procedure: RIGHT/LEFT HEART CATH AND CORONARY ANGIOGRAPHY;  Surgeon: Martinique, Peter M, MD;  Location: [place] [facility];  Service: Cardiovascular;  Laterality: N/A;  TEE WITHOUT CARDIOVERSION  07/04/2018  Procedure: TRANSESOPHAGEAL ECHOCARDIOGRAM (TEE);  Surgeon: Sherren Mocha, MD;  Location: [place];  Service: Open Heart Surgery;;  TOTAL KNEE ARTHROPLASTY    bilateral  TRANSCATHETER AORTIC VALVE REPLACEMENT, TRANSFEMORAL  07/04/2018  TRANSCATHETER AORTIC VALVE REPLACEMENT, TRANSFEMORAL N/A 07/04/2018  Procedure: TRANSCATHETER AORTIC VALVE REPLACEMENT, TRANSFEMORAL;  Surgeon: Sherren Mocha, MD;  Location: [place];  Service: Open Heart Surgery;  Laterality: N/A;  VASECTOMY   [name], MS [facility] SLP Acute Rehab Services Office [phone] [name] 07/28/2022, 10:21 AM    Scheduled Meds:  amLODipine  10 mg Oral Daily   aspirin  81 mg Oral Once per day on Mon Thu   dorzolamide-timolol  1 drop Left Eye BID   finasteride  5 mg Oral Daily   gabapentin  100 mg Oral TID   heparin injection (subcutaneous)  5,000 Units Subcutaneous Q8H   insulin aspart  0-5 Units Subcutaneous QHS   insulin aspart  0-9 Units Subcutaneous TID WC   levofloxacin  250 mg Oral Daily   [START ON 07/30/2022] multivitamin with minerals  1 tablet Oral Q lunch   mouth rinse  15 mL Mouth Rinse 4 times per day   sodium chloride flush  3 mL Intravenous Q12H   Continuous Infusions:  sodium chloride     dextrose 75 mL/hr at 07/29/22 1328     LOS: 4 days   Time spent: [place], MD [facility] To contact the attending provider between 7A-7P or the covering provider during after hours 7P-7A, please log into the web site [email or web address] and access using universal [place] password for that web site. If you do not have the password, please call the hospital operator.  07/29/2022, 2:16 PM

## 2022-07-29 NOTE — Consult Note (Signed)
Consultation Note Date: 07/29/2022   Patient Name: [name] [name]  DOB: 05/24/1932  MRN: [id number]  Age / Sex: 89 y.o., male  PCP: [name] Lopes, MD Referring Physician: Nita Sells, MD  Reason for Consultation:  goc  HPI/Patient Profile: 89 y.o. male  with past medical history of mild dementia, HTN, PVD, DM, HLD, C3, post-herpetic neuralgia, aortic stenosis s/p TAVR, GERD, hiatal hernia, BPH admitted on 07/25/2022 after a fall at home. Workup up revealed R lower lobe pneumonia. SLP consulted extensively- he is aspirating- precautions in place. Has ongoing severe facial pain from post herpetic neuralgia. He has had increasing somnolence yesterday and today and hypoglycemic events. Palliative medicine consulted for "goc".   Primary Decision Maker NEXT OF KIN- he has children- they are in process of obtaining his HCPOA from a lock box- they believe that one of [name] [name]'s daughter's is HCPOA but are looking to confirm   Discussion: Chart reviewed including labs, progress notes, imaging from this and previous encounters.   On evaluation patient is lethargic. He wakes briefly, complains of headache pain and returns to sleep.  Son, [name] [name] is at bedside. I introduced palliative medicine. Palliative medicine is specialized medical care for people living with serious illness. It focuses on providing relief from the symptoms and stress of a serious illness. The goal is to improve quality of life for both the patient and the family. Prior to admission patient was living at home with his spouse. Son notes that he was independent but probably needed higher level of care. He was able to ambulate in the home. Used a walker outside of the home. Enjoyed bird watching. He used to enjoy being outside and working in his garden, but has had decline in the last year in his function and hasn't been able to do these things.  [name] [name] has  been telling [name] [name] that he wants to die. However, [name] [name] feels that he is only saying this because he is in so much pain. We discussed the importance of advanced care planning and determining goals of care for [name] [name] within the context of what Yahweh would consider quality of life.  [name] [name] used to be [facility] but believes it was changed to his sister. Family is obtaining HCPOA document to confirm who is Psychologist, clinical- but they would like to all meet together to discuss options.    SUMMARY OF RECOMMENDATIONS -FTT in setting of pneumonia, post-herpetic neuralgia, progressive physical decline in the last year -PMT will attempt to arrange discussion with all of patient's children tomorrow -Low dose morphine '1mg'$  IV q6 hrs prn for headache pain related to postherpetic neuralgia- he was on hydrocodone '5mg'$  po q6hr prn prior to admission which equals '20mg'$  oral MME that converts to '10mg'$  IV MME; I dose reduced for somnolence and rotation of opioid    Code Status/Advance Care Planning: DNR   Prognosis:   Unable to determine  Discharge Planning: To Be Determined  Primary Diagnoses: Present on Admission:  Right lower lobe pneumonia  Hypertension  Sleep apnea  Review of Systems  Unable to perform ROS: Mental status change    Physical Exam Vitals and nursing note reviewed.  Constitutional:      Appearance: He is ill-appearing.  Neurological:     Comments: lethargic     Vital Signs: BP (!) 112/55 (BP Location: Left Arm)   Pulse 72   Temp 98.7 F (37.1 C) (Oral)   Resp 12   Ht '5\' 9"'$  (1.753 m)   Wt 92.9 kg   SpO2 95%   BMI 30.24 kg/m  Pain Scale: 0-10 POSS *See Group Information*: 1-Acceptable,Awake and alert Pain Score: 3    SpO2: SpO2: 95 % O2 Device:SpO2: 95 % O2 Flow Rate: .O2 Flow Rate (L/min): 6 L/min  IO: Intake/output summary:  Intake/Output Summary (Last 24 hours) at 07/29/2022 1625 Last data filed at 07/29/2022 0300 Gross per 24 hour  Intake 419.83 ml  Output 800  ml  Net -380.17 ml    LBM: Last BM Date : 07/25/22 Baseline Weight: Weight: 99.7 kg Most recent weight: Weight: 92.9 kg       Thank you for this consult. Palliative medicine will continue to follow and assist as needed.   Greater than 50%  of this time was spent counseling and coordinating care related to the above assessment and plan.  Signed by: Mariana Kaufman, AGNP-C Palliative Medicine    Please contact Palliative Medicine Team phone at [phone] for questions and concerns.  For individual provider: See Shea Evans

## 2022-07-29 NOTE — TOC Progression Note (Signed)
Transition of Care [facility]) - Progression Note    Patient Details  Name: [name] [name] MRN: [id number] Date of Birth: 10/23/1931  Transition of Care [facility]) CM/SW [place], [place] Phone Number: 07/29/2022, 2:44 PM  Clinical Narrative:     CSW met with pts daughter bedside; pt asleep. CSW provided SNF offers. Daughter explains they were interested in [place] but wants to discuss with pt's spouse prior to confirming choice. She calls pt's spouse in room and puts her on speaker. Spouse confirms choice for [facility]. CSW explains medicare coverage for SNF and insurance auth process. TOC will follow to coordinate DC to SNF once medically ready.   CSW confirmed with [facility] they can accept pt pending SNF auth. CSW submitted SNF auth request in [employer] portal. Auth status: pending  Expected Discharge Plan: [place] Barriers to Discharge: Continued Medical Work up  Expected Discharge Plan and Services                                               Social Determinants of Health (SDOH) Interventions SDOH Screenings   Food Insecurity: No Food Insecurity (07/26/2022)  Housing: Low Risk  (07/25/2022)  Transportation Needs: Unmet Transportation Needs (07/25/2022)  Utilities: Not At Risk (07/25/2022)  Depression (PHQ2-9): Low Risk  (06/29/2018)  Tobacco Use: Medium Risk (07/26/2022)    Readmission Risk Interventions     No data to display

## 2022-07-29 NOTE — Progress Notes (Signed)
Speech Language Pathology Treatment: Dysphagia  Patient Details Name: [name] [name] MRN: [id number] DOB: 1931-09-12 Today's Date: 07/29/2022 Time: 1250-1306 SLP Time Calculation (min) (ACUTE ONLY): 16 min  Assessment / Plan / Recommendation Clinical Impression  SLP visit focused on dysphagia goals. Son, [name] [name], present and reports dad had worked with PT/OT but was unable to get to chair. Pt was too lethargic to work with this therapist - but son desired to see MBS study. Reviewed normal MBS and pt's MBS with [name] [name] discussing both deconditioning and pt's anterior cervical osteophytes preventing epiglottic deflection contributing to dysphagia.   Discussed pt's cough/expectoration ability being helpful to compensate for pt's suspected chronic dysphagia *dating back to at least 2019 when he had UGI study* but now with current illness, advanced age, etc, he is no longer able to compensate. Reviewed concerns for pt's nutrition/hydration and aspiration concerns present. [place] that feeding tubes provide nutrition but do not prevent pt from aspirating secretions and may carry higher level of burden than benefit.   Recommend pt and family consider having a palliative consult to help elucidate goals. Discussed concept of po for pleasure - not nutritional benefits. Note diet advanced to regular/thin consistency by MD due to pt's displeasure with texture.   Will follow up to determine if active dysphagia treatment including RMST and pharyngeal tongue base retraction strengthening is indicated if family/pt desire continued aggressive treatment. [name] [name] reported appreciation for education and discussion. Reviewed that if pt is coughing with intake, he is aspirating and he should be cued to cough and expectorate to clear.     HPI HPI: Patient is an 89 y.o. male with PMH: HTN, aortic stenosis status post TAVR, sleep apnea, GERD, diabetes mellitus type 2, CKD stage III, BPH, hiatal hernia (daughter reports it is  large but he is not surgical candidate, remote smoker. He presented to the hospital on 07/25/22 after being found on the floor by his wife; unwitnessed fall slipping from chair onto floor. CXR showed right LL PNA.  Swallow eval was done 2/12 and reordered due to pt having coughing with liquids.  MBS advised - and ordered but xray unable to conduct this pm - therefore BSE completed. MBS 07/28/2022.      SLP Plan  Continue with current plan of care      Recommendations for follow up therapy are one component of a multi-disciplinary discharge planning process, led by the attending physician.  Recommendations may be updated based on patient status, additional functional criteria and insurance authorization.    Recommendations  Diet recommendations: Other(comment) (diet was advanced to regular/thin consistency) Medication Administration: Other (Comment) (with nectar or puree - crushed if not contraindicated) Compensations: Slow rate;Small sips/bites (use puree or nectar to clear particles of food into pharynx) Postural Changes and/or Swallow Maneuvers: Seated upright 90 degrees;Upright 30-60 min after meal (cough strongly to clear if reflexively coughing)                Oral Care Recommendations: Oral care BID Follow Up Recommendations: Skilled nursing-short term rehab (<3 hours/day) SLP Visit Diagnosis: Dysphagia, oropharyngeal phase (R13.12);Dysphagia, pharyngoesophageal phase (R13.14) Plan: Continue with current plan of care         [name] Lime, MS [place] Office [phone]  [name]  07/29/2022, 5:01 PM

## 2022-07-29 NOTE — Progress Notes (Signed)
Occupational Therapy Treatment Patient Details Name: [name] [name] MRN: [id number] DOB: [birth year] Today's Date: 07/29/2022   History of present illness Patient is a 89 years old male with past medical history of hypertension, aortic stenosis status post TAVR, sleep apnea, GERD, diabetes mellitus type 2, CKD stage III, BPH was brought into the hospital after being found on the floor by his wife after slipping from the chair and was on the ground for a while, it was an unwitnessed fall. X-ray of the chest showed right lower lobe pneumonia.   OT comments  Treatment focused on UE exercise to improve strength and reduce UE edema as well as functional transfers. Patient max-total assist x 2 for bed transfers and fatigued very quickly. O2 sat down to 87-88% on 6 L [place] and took several minutes to recover as patient is a heavy mouth breather. Continue to recommend short term rehab at discharge.    Recommendations for follow up therapy are one component of a multi-disciplinary discharge planning process, led by the attending physician.  Recommendations may be updated based on patient status, additional functional criteria and insurance authorization.    Follow Up Recommendations  Skilled nursing-short term rehab (<3 hours/day)     Assistance Recommended at Discharge Frequent or constant Supervision/Assistance  Patient can return home with the following  Two people to help with walking and/or transfers;Assistance with cooking/housework;Assist for transportation;A lot of help with bathing/dressing/bathroom;Two people to help with bathing/dressing/bathroom   Equipment Recommendations  None recommended by OT    Recommendations for Other Services      Precautions / Restrictions Precautions Precautions: Fall Precaution Comments: Aspiration. Severe left eye pain. Restrictions Weight Bearing Restrictions: No       Mobility Bed Mobility Overal bed mobility: Needs Assistance Bed Mobility: Supine to  Sit, Sit to Supine Rolling: Max assist, +2 for physical assistance   Supine to sit: Total assist, +2 for physical assistance     General bed mobility comments: Transferred to edge of bed. Needing physical assist to maintain sitting balance. Poor kyphotic posture and fatigued quickly.    Transfers                         Balance Overall balance assessment: Needs assistance Sitting-balance support: No upper extremity supported, Feet supported Sitting balance-Leahy Scale: Poor                                     ADL either performed or assessed with clinical judgement   ADL                                              Extremity/Trunk Assessment              Vision       Perception     Praxis      Cognition Arousal/Alertness: Awake/alert Behavior During Therapy: WFL for tasks assessed/performed Overall Cognitive Status: Difficult to assess                                 General Comments: Improved alertness and can follow commands and answer simple questions        Exercises Other Exercises Other Exercises: Fist pumps, wrist  wags, elbow flexion and shoulder flexion reps requiring various levels of assistance due to arousal. Predominantly x 10 each arm.    Shoulder Instructions       General Comments      Pertinent Vitals/ Pain       Pain Assessment Pain Assessment: Faces Faces Pain Scale: Hurts even more Pain Location: left face - shingles= trigeminal neuralgia Pain Descriptors / Indicators: Stabbing, Penetrating, Pins and needles, Tender, Grimacing, Discomfort Pain Intervention(s): Limited activity within patient's tolerance  Home Living                                          Prior Functioning/Environment              Frequency  Min 2X/week        Progress Toward Goals  OT Goals(current goals can now be found in the care plan section)  Progress towards OT  goals: Progressing toward goals  Acute Rehab OT Goals Patient Stated Goal: get stronger, do more OT Goal Formulation: With family Time For Goal Achievement: 08/09/22 Potential to Achieve Goals: [place] Discharge plan remains appropriate    Co-evaluation    PT/OT/SLP Co-Evaluation/Treatment: Yes   PT goals addressed during session: Mobility/safety with mobility OT goals addressed during session: Strengthening/ROM      AM-PAC OT "6 Clicks" Daily Activity     Outcome Measure   Help from another person eating meals?: A Little Help from another person taking care of personal grooming?: A Lot Help from another person toileting, which includes using toliet, bedpan, or urinal?: Total Help from another person bathing (including washing, rinsing, drying)?: Total Help from another person to put on and taking off regular upper body clothing?: Total Help from another person to put on and taking off regular lower body clothing?: Total 6 Click Score: 9    End of Session Equipment Utilized During Treatment: Oxygen  OT Visit Diagnosis: Unsteadiness on feet (R26.81);Pain;Repeated falls (R29.6);Muscle weakness (generalized) (M62.81);History of falling (Z91.81);Feeding difficulties (R63.3) Pain - Right/Left: Left Pain - part of body:  (head)   Activity Tolerance Patient tolerated treatment well   Patient Left in bed;with call bell/phone within reach;with family/visitor present;with nursing/sitter in room;with bed alarm set   Nurse Communication Mobility status        Time: [id number] OT Time Calculation (min): 31 min  Charges: OT General Charges $OT Visit: 1 Visit OT Treatments $Therapeutic Exercise: 8-22 mins  Gustavo Lah, OTR/L [place]  Office [phone]   Lenward Chancellor 07/29/2022, 1:02 PM

## 2022-07-29 NOTE — Progress Notes (Signed)
Hypoglycemic Event  CBG: 68  Treatment: 4 oz juice/soda  Symptoms: Nervous/irritable  Follow-up CBG: [id number] CBG Result:79  Possible Reasons for Event: Inadequate meal intake and Medication regimen: insulins  Comments/MD notified: MD aware, insulin DC'd.  Meal intake encouraged to patient

## 2022-07-29 NOTE — Progress Notes (Signed)
Pharmacy Antibiotic Note  [name] [name] is a 89 y.o. male admitted on 07/25/2022 with pneumonia.  Pharmacy has been consulted for Zosyn dosing.  Plan: Zosyn 3.375g IV q8h (4 hour infusion).  Height: 5' 9"$  (175.3 cm) Weight: 92.9 kg (204 lb 12.9 oz) IBW/kg (Calculated) : 70.7  Temp (24hrs), Avg:98.6 F (37 C), Min:98.2 F (36.8 C), Max:98.7 F (37.1 C)  Recent Labs  Lab 07/25/22 1549 07/25/22 1749 07/25/22 2202 07/26/22 0410 07/27/22 0449 07/28/22 0534 07/29/22 0500  WBC 11.2*  --  7.1 6.9 6.7 5.5  --   CREATININE 1.96*  --  1.97* 1.88* 1.68* 1.58* 1.49*  LATICACIDVEN 4.1* 2.6*  --   --   --   --   --     Estimated Creatinine Clearance: 38.6 mL/min (A) (by C-G formula based on SCr of 1.49 mg/dL (H)).    Allergies  Allergen Reactions   Indocin [Indomethacin] Other (See Comments)    Antiinflammatory medication for gout ? Indocin > black out per patient ? SYNCOPE ?     Dosage will likely remain stable at above dose and need for further dosage adjustment appears unlikely at present.    Will sign off at this time.  Please reconsult if a change in clinical status warrants re-evaluation of dosage.    Thank you for allowing pharmacy to be a part of this patient's care.   Royetta Asal, PharmD, BCPS 07/29/2022 2:58 PM

## 2022-07-30 DIAGNOSIS — J189 Pneumonia, unspecified organism: Secondary | ICD-10-CM | POA: Diagnosis not present

## 2022-07-30 LAB — CBC WITH DIFFERENTIAL/PLATELET
Abs Immature Granulocytes: 0.03 10*3/uL (ref 0.00–0.07)
Basophils Absolute: 0 10*3/uL (ref 0.0–0.1)
Basophils Relative: 0 %
Eosinophils Absolute: 0.2 10*3/uL (ref 0.0–0.5)
Eosinophils Relative: 2 %
HCT: 34 % — ABNORMAL LOW (ref 39.0–52.0)
Hemoglobin: 10.6 g/dL — ABNORMAL LOW (ref 13.0–17.0)
Immature Granulocytes: 0 %
Lymphocytes Relative: 6 %
Lymphs Abs: 0.5 10*3/uL — ABNORMAL LOW (ref 0.7–4.0)
MCH: 32.6 pg (ref 26.0–34.0)
MCHC: 31.2 g/dL (ref 30.0–36.0)
MCV: 104.6 fL — ABNORMAL HIGH (ref 80.0–100.0)
Monocytes Absolute: 0.4 10*3/uL (ref 0.1–1.0)
Monocytes Relative: 6 %
Neutro Abs: 6.7 10*3/uL (ref 1.7–7.7)
Neutrophils Relative %: 86 %
Platelets: 122 10*3/uL — ABNORMAL LOW (ref 150–400)
RBC: 3.25 MIL/uL — ABNORMAL LOW (ref 4.22–5.81)
RDW: 13.9 % (ref 11.5–15.5)
WBC: 7.8 10*3/uL (ref 4.0–10.5)
nRBC: 0 % (ref 0.0–0.2)

## 2022-07-30 LAB — BASIC METABOLIC PANEL
Anion gap: 17 — ABNORMAL HIGH (ref 5–15)
BUN: 21 mg/dL (ref 8–23)
CO2: 23 mmol/L (ref 22–32)
Calcium: 8.4 mg/dL — ABNORMAL LOW (ref 8.9–10.3)
Chloride: 100 mmol/L (ref 98–111)
Creatinine, Ser: 1.54 mg/dL — ABNORMAL HIGH (ref 0.61–1.24)
GFR, Estimated: 43 mL/min — ABNORMAL LOW (ref >60)
Glucose, Bld: 141 mg/dL — ABNORMAL HIGH (ref 70–99)
Potassium: 4.4 mmol/L (ref 3.5–5.1)
Sodium: 140 mmol/L (ref 135–145)

## 2022-07-30 LAB — CULTURE, BLOOD (ROUTINE X 2)
Culture: NO GROWTH
Special Requests: ADEQUATE

## 2022-07-30 LAB — GLUCOSE, CAPILLARY
Glucose-Capillary: 97 mg/dL (ref 70–99)
Glucose-Capillary: 138 mg/dL — ABNORMAL HIGH (ref 70–99)

## 2022-07-30 MED ORDER — MORPHINE SULFATE (PF) 2 MG/ML IV SOLN
0.5000 mg | INTRAVENOUS | Status: DC | PRN
  Administered 2022-07-30 – 2022-08-03 (×2): 0.5 mg via INTRAVENOUS
  Filled 2022-07-30 (×4): qty 1

## 2022-07-30 MED ORDER — MORPHINE SULFATE (PF) 2 MG/ML IV SOLN
0.5000 mg | Freq: Once | INTRAVENOUS | Status: AC
  Administered 2022-07-30: 0.5 mg via INTRAVENOUS
  Filled 2022-07-30: qty 1

## 2022-07-30 MED ORDER — MORPHINE SULFATE (PF) 2 MG/ML IV SOLN
0.5000 mg | Freq: Four times a day (QID) | INTRAVENOUS | Status: DC | PRN
  Administered 2022-07-30: 0.5 mg via INTRAVENOUS
  Filled 2022-07-30: qty 1

## 2022-07-30 MED ORDER — GABAPENTIN 100 MG PO CAPS
200.0000 mg | ORAL_CAPSULE | Freq: Three times a day (TID) | ORAL | Status: DC
  Administered 2022-07-30 – 2022-07-31 (×2): 200 mg via ORAL
  Filled 2022-07-30 (×2): qty 2

## 2022-07-30 NOTE — Progress Notes (Signed)
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 Zadie Rhine ?  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  Brought to emergency room on 07/25/2022 Found to be hypoxic 88% and was placed initially on a nonrebreather Lactic acid on admission elevated 4.1, BNP 211 COVID and flu negative CXR = RLL pneumonia  2/15: Less responsive?  Meds 2/16: Palliative med consulted and assisting with pain management goals of care  Hospital-Problem based course  Toxic metabolic encephalopathy 123456 - Discontinue oxycodone, cut back gabapentin 300-->200 3 times daily - Likely related to pneumonia but also hypoglycemia and therefore is on D5 75 cc/H  Postherpetic neuralgia with intractable pain - See above discussion- -Will need to balance sedation with meds versus pain from lack of meds - Have increased morphine to 0.5 every 3 as needed, patient is on gabapentin now 200 3 times daily - Await palliative care expertise  Aspiration R sided pneumonia with hypoxia and sepsis present on admission - Sepsis not present on admission - Speech therapy saw the patient recommended dysphagia 3 diet - ABX changed to broad-spectrum Zosyn 2/15 until 2/20 (10 days) given worsening symptoms, would not de-escalate for now - Repeat CXR AM two-view 2/17  Elevated troponins in the setting of sinus tach [likely type II demand ischemia] -Prior Left heart cath = mild nonobstructive CAD with 80% marginal branch stenosis  -Medical therapy  was recommended at that time only as he was not a good catheterization candidate - Echocardiogram shows EF 40 to 45% global hypokinesis-not a great candidate for invasive stratification--- hold on cardiology evaluation  Fall and deconditioning - Unclear if good appropriate candidate for skilled at this time--patient will need to be seen over the weekend to determine if needs discussion regarding hospice versus not - Appreciate palliative expertise in terms of pain management etc.  Aortic stenosis with TAVR in the past - See above, continue aspirin 81 twice weekly  AKI on admission superimposed on CKD 3A - Improved from admission-IVF had to be resumed on 2/15  -Eating only 10% of meals  DM TY 2 with hyperglycemia with complication of hypoglycemia this morning - Discontinued completely long-acting insulin as well as short acting as he does not want to be stuck and refuses insulin -CBGs 90s to 130's -I will discontinue checking  OSA on CPAP -Continue CPAP at night as able - Continue oxygen  DVT prophylaxis: Heparin Code Status: DNR confirmed Family Communication: Discussed with son in detail at the bedside-patient has made some improvement-he feels overall somewhat better than he did yesterday Patient is more coherent Disposition:  Status is: Inpatient Remains inpatient appropriate because:   Not ready for the discharge over the weekend-need to see clinical trajectory?  May be skilled facility on Monday if significant improvement but may need to keep on IV antibiotics longer-term    Subjective:   more coherent but in moderate pain to his left eyebrow, still appears quite weak Not coughing a whole lot, only eating 10%, refusing CBGs and insulin I am okay  with that Pain is pretty severe in the right temple and I gave an extra dose of IV morphine at around 4:00 Dr. Hilma Favors of palliative has been by and I am appreciated of her expertise in managing the meds for  pain  Objective: Vitals:   07/30/22 0400 07/30/22 0500 07/30/22 1256 07/30/22 1257  BP: (!) 142/54   (!) 147/48  Pulse: 67   80  Resp: 16   15  Temp: 98.3 F (36.8 C)  99 F (37.2 C)   TempSrc: Oral  Oral   SpO2: 94%   93%  Weight:  89.8 kg    Height:        Intake/Output Summary (Last 24 hours) at 07/30/2022 1713 Last data filed at 07/30/2022 1218 Gross per 24 hour  Intake 541.83 ml  Output 400 ml  Net 141.83 ml    Filed Weights   07/28/22 0500 07/29/22 0439 07/30/22 0500  Weight: 91.2 kg 92.9 kg 89.8 kg    Examination:  More coherent awake seems quite tired however, Coarse rales, poor effort at inspiration S1-S2 no murmur, seems sinus on exam Abdomen is soft--has umbilical hernia Heel protectors on heels as well --will review toes in am No lower extremity edema   Data Reviewed: personally reviewed   CBC    Component Value Date/Time   WBC 7.8 07/30/2022 0529   RBC 3.25 (L) 07/30/2022 0529   HGB 10.6 (L) 07/30/2022 0529   HGB 10.0 (L) 07/13/2018 1455   HCT 34.0 (L) 07/30/2022 0529   HCT 30.0 (L) 07/13/2018 1455   PLT 122 (L) 07/30/2022 0529   PLT 104 (L) 07/13/2018 1455   MCV 104.6 (H) 07/30/2022 0529   MCV 98 (H) 07/13/2018 1455   MCH 32.6 07/30/2022 0529   MCHC 31.2 07/30/2022 0529   RDW 13.9 07/30/2022 0529   RDW 15.8 (H) 07/13/2018 1455   LYMPHSABS 0.5 (L) 07/30/2022 0529   LYMPHSABS 0.8 07/13/2018 1455   MONOABS 0.4 07/30/2022 0529   EOSABS 0.2 07/30/2022 0529   EOSABS 0.3 07/13/2018 1455   BASOSABS 0.0 07/30/2022 0529   BASOSABS 0.1 07/13/2018 1455      Latest Ref Rng & Units 07/30/2022    6:58 AM 07/29/2022    5:00 AM 07/28/2022    5:34 AM  CMP  Glucose 70 - 99 mg/dL 141  72  75   BUN 8 - 23 mg/dL 21  25  32   Creatinine 0.61 - 1.24 mg/dL 1.54  1.49  1.58   Sodium 135 - 145 mmol/L 140  143  142   Potassium 3.5 - 5.1 mmol/L 4.4  4.4  4.1   Chloride 98 - 111 mmol/L 100  109  110   CO2 22 - 32 mmol/L 23  24  23   $ Calcium 8.9 - 10.3 mg/dL  8.4  8.7  8.4      Radiology Studies: No results found.   Scheduled Meds:  amLODipine  10 mg Oral Daily   aspirin  81 mg Oral Once per day on Mon Thu   dorzolamide-timolol  1 drop Left Eye BID   finasteride  5 mg Oral Daily   gabapentin  200 mg Oral TID   heparin injection (subcutaneous)  5,000 Units Subcutaneous Q8H   insulin aspart  0-5 Units Subcutaneous QHS   insulin aspart  0-9 Units Subcutaneous TID WC   multivitamin with minerals  1 tablet Oral Q lunch   mouth rinse  15 mL Mouth Rinse 4 times  per day   sodium chloride flush  3 mL Intravenous Q12H   Continuous Infusions:  sodium chloride     dextrose 75 mL/hr at 07/29/22 1328   piperacillin-tazobactam (ZOSYN)  IV 3.375 g (07/30/22 1218)     LOS: 5 days   Time spent: [place], MD [facility] To contact the attending provider between 7A-7P or the covering provider during after hours 7P-7A, please log into the web site [email or web address] and access using universal [place] password for that web site. If you do not have the password, please call the hospital operator.  07/30/2022, 5:13 PM

## 2022-07-30 NOTE — Plan of Care (Signed)
    Problem: Elimination: Goal: Will not experience complications related to urinary retention Outcome: Completed/Met   Problem: Pain Managment: Goal: General experience of comfort will improve Outcome: Not Progressing Plans to meet with palliative to discuss GOC and pain management   Problem: Safety: Goal: Ability to remain free from injury will improve Outcome: Progressing     Problem: Skin Integrity: Goal: Risk for impaired skin integrity will decrease Outcome: Not Progressing  Pt declines repositioning d/t pain and discomfort

## 2022-07-30 NOTE — IPAL (Signed)
  Interdisciplinary Goals of Care Family Meeting   Date carried out: 07/30/2022  Location of the meeting: Bedside  Member's involved: Physician and Family Member or next of kin  Durable Power of Attorney or acting medical decision maker: Yes-Son    Discussion: We discussed goals of care for [employer] .  He wants everything possible done at this time to help improve PNA--note, however is DNR We didn't delve into the details of this Dr. Carylon Perches plans to consolidate decisions tomorrow.  We await his sister  Code status:   Code Status: DNR   Disposition: Continue current acute care  Time spent for the meeting: 12    Nita Sells, MD  07/30/2022, 5:38 PM

## 2022-07-30 NOTE — Progress Notes (Signed)
Assumed care of patient at 1400 from Russ Halo, RN. Agree with previously documented assessment and will continue current plan of care.

## 2022-07-31 ENCOUNTER — Inpatient Hospital Stay (HOSPITAL_COMMUNITY): Payer: Medicare Other

## 2022-07-31 DIAGNOSIS — Z66 Do not resuscitate: Secondary | ICD-10-CM

## 2022-07-31 DIAGNOSIS — A419 Sepsis, unspecified organism: Principal | ICD-10-CM

## 2022-07-31 DIAGNOSIS — J189 Pneumonia, unspecified organism: Secondary | ICD-10-CM | POA: Diagnosis not present

## 2022-07-31 DIAGNOSIS — W19XXXA Unspecified fall, initial encounter: Secondary | ICD-10-CM | POA: Diagnosis not present

## 2022-07-31 DIAGNOSIS — Z7189 Other specified counseling: Secondary | ICD-10-CM | POA: Diagnosis not present

## 2022-07-31 DIAGNOSIS — J9601 Acute respiratory failure with hypoxia: Secondary | ICD-10-CM

## 2022-07-31 DIAGNOSIS — Z515 Encounter for palliative care: Secondary | ICD-10-CM

## 2022-07-31 DIAGNOSIS — M792 Neuralgia and neuritis, unspecified: Secondary | ICD-10-CM

## 2022-07-31 LAB — COMPREHENSIVE METABOLIC PANEL
ALT: 21 U/L (ref 0–44)
AST: 20 U/L (ref 15–41)
Albumin: 2.3 g/dL — ABNORMAL LOW (ref 3.5–5.0)
Alkaline Phosphatase: 74 U/L (ref 38–126)
Anion gap: 9 (ref 5–15)
BUN: 21 mg/dL (ref 8–23)
CO2: 27 mmol/L (ref 22–32)
Calcium: 8.7 mg/dL — ABNORMAL LOW (ref 8.9–10.3)
Chloride: 101 mmol/L (ref 98–111)
Creatinine, Ser: 1.51 mg/dL — ABNORMAL HIGH (ref 0.61–1.24)
GFR, Estimated: 44 mL/min — ABNORMAL LOW (ref >60)
Glucose, Bld: 145 mg/dL — ABNORMAL HIGH (ref 70–99)
Potassium: 4.2 mmol/L (ref 3.5–5.1)
Sodium: 137 mmol/L (ref 135–145)
Total Bilirubin: 0.7 mg/dL (ref 0.3–1.2)
Total Protein: 5.7 g/dL — ABNORMAL LOW (ref 6.5–8.1)

## 2022-07-31 LAB — CBC
HCT: 31.6 % — ABNORMAL LOW (ref 39.0–52.0)
Hemoglobin: 10.1 g/dL — ABNORMAL LOW (ref 13.0–17.0)
MCH: 32.2 pg (ref 26.0–34.0)
MCHC: 32 g/dL (ref 30.0–36.0)
MCV: 100.6 fL — ABNORMAL HIGH (ref 80.0–100.0)
Platelets: 119 10*3/uL — ABNORMAL LOW (ref 150–400)
RBC: 3.14 MIL/uL — ABNORMAL LOW (ref 4.22–5.81)
RDW: 13.8 % (ref 11.5–15.5)
WBC: 6.7 10*3/uL (ref 4.0–10.5)
nRBC: 0 % (ref 0.0–0.2)

## 2022-07-31 LAB — CULTURE, BLOOD (ROUTINE X 2)
Culture: NO GROWTH
Special Requests: ADEQUATE

## 2022-07-31 MED ORDER — FENTANYL 12 MCG/HR TD PT72
1.0000 | MEDICATED_PATCH | TRANSDERMAL | Status: DC
  Administered 2022-07-31: 1 via TRANSDERMAL
  Filled 2022-07-31 (×2): qty 1

## 2022-07-31 MED ORDER — GABAPENTIN 100 MG PO CAPS
200.0000 mg | ORAL_CAPSULE | Freq: Three times a day (TID) | ORAL | Status: DC
  Administered 2022-07-31 – 2022-08-04 (×14): 200 mg via ORAL
  Filled 2022-07-31 (×14): qty 2

## 2022-07-31 MED ORDER — GABAPENTIN 300 MG PO CAPS: 300.0000 mg | ORAL_CAPSULE | Freq: Three times a day (TID) | ORAL | Status: DC

## 2022-07-31 NOTE — Progress Notes (Signed)
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 Zadie Rhine ?  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  Brought to emergency room on 07/25/2022 Found to be hypoxic 88% and was placed initially on a nonrebreather Lactic acid on admission elevated 4.1, BNP 211 COVID and flu negative CXR = RLL pneumonia  2/15: Less responsive?  Meds 2/16: Palliative med consulted and assisting with pain management goals of care  Hospital-Problem based course  Toxic metabolic encephalopathy 123456 - Discontinue oxycodone, cut back gabapentin 300-->200 3 times daily - Likely related to pneumonia but also hypoglycemia and therefore is on D5 75 cc/H  Postherpetic neuralgia with intractable pain - See above discussion--appreciative of PMT expertise--thank you - Have increased morphine to 0.5 every 3 as needed, patient is on gabapentin now 200 3 times daily  Aspiration R sided pneumonia with hypoxia and sepsis present on admission - Sepsis not present on admission - Speech therapy saw the patient recommended dysphagia 3 diet - ABX changed to broad-spectrum Zosyn 2/15 until 2/20 (10 days) given worsening symptoms, would not de-escalate for now - Repeat CXR AM shows atelectasis/Infiltrate to my over-read--but on the R side looks more confluent   Elevated troponins in the setting of sinus tach [likely type II demand ischemia] -Prior Left heart cath = mild nonobstructive CAD with 80% marginal branch stenosis  -Medical  therapy was recommended at that time only as he was not a good catheterization candidate - Echocardiogram shows EF 40 to 45% global hypokinesis-not candidate for invasive stratification--- hold on cardiology evaluation  Fall and deconditioning - Unclear if good appropriate candidate for skilled at this time--patient will need to be seen over the weekend to determine if needs discussion regarding hospice vs not - Appreciate palliative expertise in terms of pain management etc.  Aortic stenosis with TAVR in the past - See above, continue aspirin 81 twice weekly  AKI on admission superimposed on CKD 3A -Improved from admission-IVF had to be resumed on 2/15  -Eating 25% of meals  DM TY 2 with hyperglycemia with complication of hypoglycemia this morning - Discontinued completely long-acting insulin as well as short acting as he does not want to be stuck and refuses insulin -CBGs 90s to 130's  OSA on CPAP -Continue CPAP at night as able - Continue oxygen  DVT prophylaxis: Heparin Code Status: DNR confirmed Family Communication: Discussed with son/daughter at bedside--they are aware of difficulties with  The balance of pain control as well as with sedation Disposition:  Status is: Inpatient Remains inpatient appropriate because:   Not ready for the discharge over the weekend-need to see clinical trajectory?      Subjective:  quite weak Sleepy-but rousable No cp fever Fielded questions from son and daughter Displayed CX and explained things to them with help of EMR  Objective: Vitals:   07/30/22 2000 07/31/22 0620 07/31/22 0923 07/31/22 1345  BP: (!) 155/56 129/64 (!) 136/52 139/66  Pulse:  81  80  Resp: 17 20  18  $ Temp:  98  F (36.7 C)  98.6 F (37 C)  TempSrc:  Oral  Oral  SpO2: 99% 95%  96%  Weight:  88.4 kg    Height:        Intake/Output Summary (Last 24 hours) at 07/31/2022 1402 Last data filed at 07/31/2022 [id number] Gross per 24 hour  Intake 1426.3 ml  Output  1100 ml  Net 326.3 ml    Filed Weights   07/29/22 0439 07/30/22 0500 07/31/22 0620  Weight: 92.9 kg 89.8 kg 88.4 kg    Examination:  Sleepy ill appearing  Coarse rales, poor effort at inspiration S1-S2 no murmur, seems sinus on exam Abdomen is soft--umbilical hernia Heel protectors on heels as well --toes a little mottled No lower extremity edema   Data Reviewed: personally reviewed   CBC    Component Value Date/Time   WBC 6.7 07/31/2022 0443   RBC 3.14 (L) 07/31/2022 0443   HGB 10.1 (L) 07/31/2022 0443   HGB 10.0 (L) 07/13/2018 1455   HCT 31.6 (L) 07/31/2022 0443   HCT 30.0 (L) 07/13/2018 1455   PLT 119 (L) 07/31/2022 0443   PLT 104 (L) 07/13/2018 1455   MCV 100.6 (H) 07/31/2022 0443   MCV 98 (H) 07/13/2018 1455   MCH 32.2 07/31/2022 0443   MCHC 32.0 07/31/2022 0443   RDW 13.8 07/31/2022 0443   RDW 15.8 (H) 07/13/2018 1455   LYMPHSABS 0.5 (L) 07/30/2022 0529   LYMPHSABS 0.8 07/13/2018 1455   MONOABS 0.4 07/30/2022 0529   EOSABS 0.2 07/30/2022 0529   EOSABS 0.3 07/13/2018 1455   BASOSABS 0.0 07/30/2022 0529   BASOSABS 0.1 07/13/2018 1455      Latest Ref Rng & Units 07/31/2022    4:43 AM 07/30/2022    6:58 AM 07/29/2022    5:00 AM  CMP  Glucose 70 - 99 mg/dL 145  141  72   BUN 8 - 23 mg/dL 21  21  25   $ Creatinine 0.61 - 1.24 mg/dL 1.51  1.54  1.49   Sodium 135 - 145 mmol/L 137  140  143   Potassium 3.5 - 5.1 mmol/L 4.2  4.4  4.4   Chloride 98 - 111 mmol/L 101  100  109   CO2 22 - 32 mmol/L 27  23  24   $ Calcium 8.9 - 10.3 mg/dL 8.7  8.4  8.7   Total Protein 6.5 - 8.1 g/dL 5.7     Total Bilirubin 0.3 - 1.2 mg/dL 0.7     Alkaline Phos 38 - 126 U/L 74     AST 15 - 41 U/L 20     ALT 0 - 44 U/L 21        Radiology Studies: DG Chest 2 View  Result Date: 07/31/2022 CLINICAL DATA:  Pneumonia EXAM: CHEST - 2 VIEW COMPARISON:  07/25/2022 FINDINGS: The heart size and mediastinal contours are within normal limits. New, bandlike atelectasis or consolidation at the  right lung base, with similar appearance of heterogeneous airspace opacity. The visualized skeletal structures are unremarkable. IMPRESSION: New, bandlike atelectasis or consolidation at the right lung base, with similar appearance of heterogeneous airspace opacity. Electronically Signed   By: Delanna Ahmadi M.D.   On: 07/31/2022 13:58     Scheduled Meds:  amLODipine  10 mg Oral Daily   aspirin  81 mg Oral Once per day on Mon Thu   dorzolamide-timolol  1 drop Left Eye BID   finasteride  5 mg Oral Daily   gabapentin  300  mg Oral TID   heparin injection (subcutaneous)  5,000 Units Subcutaneous Q8H   multivitamin with minerals  1 tablet Oral Q lunch   mouth rinse  15 mL Mouth Rinse 4 times per day   sodium chloride flush  3 mL Intravenous Q12H   Continuous Infusions:  sodium chloride     dextrose 75 mL/hr at 07/31/22 0340   piperacillin-tazobactam (ZOSYN)  IV 3.375 g (07/31/22 1354)     LOS: 6 days   Time spent: [place], MD [facility] To contact the attending provider between 7A-7P or the covering provider during after hours 7P-7A, please log into the web site [email or web address] and access using universal [place] password for that web site. If you do not have the password, please call the hospital operator.  07/31/2022, 2:02 PM

## 2022-07-31 NOTE — Plan of Care (Signed)
  Problem: Coping: Goal: Level of anxiety will decrease Outcome: Progressing   Problem: Pain Managment: Goal: General experience of comfort will improve Outcome: Progressing   Problem: Safety: Goal: Ability to remain free from injury will improve Outcome: Progressing   Problem: Skin Integrity: Goal: Risk for impaired skin integrity will decrease Outcome: Progressing   

## 2022-07-31 NOTE — Progress Notes (Signed)
Palliative Medicine Inpatient Follow Up Note     Chart Reviewed. Patient assessed at the bedside. Patient's son [name] [name]), daughter [name] [name]) and her husband present at bedside. Patient's wife [name] [name]) and [name] [name]'s daughters are engaged via [place].  He is awake and alert able to engage in discussions.  [name] [name] is resting in bed. Appears uncomfortable. Is grimacing and complaining of his bilateral leg and feet pain. Describes pain as stabbing and cramping. Covers lifted off of toes which he shared provided some relief. Son at the bedside rubbing feet. Daughter encouraging patient to wiggle toes and move feet around for some relief. [name] [name] states his pain is severe and becomes tearful. Rates pain "a million" when asked.   Dr. Verlon Au also at bedside to provided detailed updates to family and review recent imaging. Collaborative goals of care discussions.   Family provided with updates and all questions answered. We discussed extensively patient's overall state of health and prognosis. Family is remaining hopeful for some improvement. They are expressing concerns about patient's intermittent somnolence and lack of engagement. They reference this to administration of pain medications. Discussed at length patient's ongoing pain and the complexity of balancing his comfort while also promoting awakeness with consideration of severity of pain and age. Patient continues to express his severe pain throughout my visit. Education provided on use of medications to try to decrease patient's discomfort and not allow for him to suffer which he is clear he does not wish to encounter.   Extensive medication review completed and discussed with patient and family.  Patient has not received oxycodone since 2/15.  Is receiving gabapentin 200 mg 3 times daily.  Has morphine 0.5 mg available as needed.  He has received as needed morphine once over the past 24 hours.  Family has been reluctant for patient to take with  fear of increased drowsiness.  Given significance of pain and obvious physical distress advised  family it would be appropriate to administer as needed morphine at this time to provide him some relief.  Patient verbalizes understanding and appreciation stating just "help me with the pain".  It was mutually agreed upon per family and providers to continue with gabapentin as this will be most effective for his neuropathic described pain.  We will continue with as needed morphine with awareness he is not receiving around-the-clock.  Family did not appreciate patient's response to oxycodone when taking at home.  States he was more asleep then awake which was not their desire.  Education provided on use of fentanyl patch which will provide slow release medication compared to immediate release allow for some hopeful improvement in his pain in conjunction with gabapentin.  Family verbalized agreement as well as patient.  Education provided on potential side effects and with awareness we will continue to monitor closely over the next 24 hours for his ability to tolerate.  Son expresses hopes that patient can participate in physical therapy as his father has expressed this is his goal.  I had an open and honest conversation with patient and family expressed send concerns that at this time due to his pain and decreased functional state his inability to participate in physical therapy and or rehab.  Education provided on the goal of rehabilitation with expected goal of not only participation but some improvement over time.  Family verbalized understanding.  I created space and opportunity for family to be able to discuss their feelings and thoughts.  Wife is tearful in conversation as we discussed best  case and worst-case scenario.  Extensive education provided on outpatient hospice as well as their inpatient facility.  Son shares his concerns with previous experience with inpatient hospice.  Education provided with  attempts to redirect his previous concerns acknowledging each patient's situation is different.  He and family verbalized understanding.  Daughter shares her son passed away from leukemia however because of sudden decline he was unable to enroll in hospice but she is familiar with their services.  Patient would like to be at home.  Discussed at length philosophy and goals of hospice and what care could potentially look like at home.  Family is clear they wish to allow patient every opportunity to continue to thrive however also acknowledging his decline, inability to perform as he once did at home, increased need for hands-on care, and minimal improvement over the past week.  They wish to continue discussions as a family.  I encourage ongoing discussions with the main focus centering around patient's quality of life and what is best for him with consideration of his overall health state.   Discussed the importance of continued conversation with family and their  medical providers regarding overall plan of care and treatment options, ensuring decisions are within the context of the patients values and GOCs.   Questions addressed and support provided.    Objective Assessment: Vital Signs Vitals:   07/31/22 0923 07/31/22 1345  BP: (!) 136/52 139/66  Pulse:  80  Resp:  18  Temp:  98.6 F (37 C)  SpO2:  96%    Intake/Output Summary (Last 24 hours) at 07/31/2022 1628 Last data filed at 07/31/2022 0800 Gross per 24 hour  Intake 1546.3 ml  Output 1100 ml  Net 446.3 ml   Last Weight  Most recent update: 07/31/2022  6:23 AM    Weight  88.4 kg (194 lb 14.2 oz)            Gen: Somnolent but easily aroused, chronically ill-appearing CV: Regular rate and rhythm, no murmurs rubs or gallops PULM: Coarse bilaterally ABD: soft/nontender/nondistended/normal bowel sounds, hernia EXT: No edema SKIN: Thin, dry, bilateral heel protectors, toes tender to touch Neuro: Alert and oriented x3, able to  somewhat engage in discussions  SUMMARY OF RECOMMENDATIONS   Continue with current plan of care per medical team  Extensive goals of care discussions.  Patient and family clear and expressed wishes to continue to treat the treatable at this time allowing family time to engage in ongoing discussions amongst themselves.  We discussed best case and worst-case scenario.  Extensive education provided on patient's overall state of health.  Encouraged family to consider patient's quality of life and wishes allowing [place] to be the center and driving factor of their decisions. Discussions regarding balancing patient's pain regimen allow for comfort and ability to remain awake and participate in therapies per family wishes. PMT will continue to support and follow on as needed basis. Please secure chat for urgent needs.   SYMPTOM MANAGEMENT Pain Management  Fentanyl 12 mcg patch Gabapentin 200 mg 3 times daily Morphine 0.5 mg every 3 hours as needed for severe pain (x 1 dose over the past 24 hours). Constipation  MiraLAX daily Senna S as needed  Discussed with Dr. Verlon Au  Time Total: 65 min   Visit consisted of counseling and education dealing with the complex and emotionally intense issues of symptom management and palliative care in the setting of serious and potentially life-threatening illness.Greater than 50%  of this time was spent  counseling and coordinating care related to the above assessment and plan.  Alda Lea, AGPCNP-BC  [place]  [phone]  Palliative Medicine Team providers are available by phone from 7am to 7pm daily and can be reached through the team cell phone. Should this patient require assistance outside of these hours, please call the patient's attending physician.

## 2022-08-01 DIAGNOSIS — J9601 Acute respiratory failure with hypoxia: Secondary | ICD-10-CM | POA: Diagnosis not present

## 2022-08-01 DIAGNOSIS — W19XXXA Unspecified fall, initial encounter: Secondary | ICD-10-CM | POA: Diagnosis not present

## 2022-08-01 DIAGNOSIS — Z7189 Other specified counseling: Secondary | ICD-10-CM | POA: Diagnosis not present

## 2022-08-01 DIAGNOSIS — J189 Pneumonia, unspecified organism: Secondary | ICD-10-CM | POA: Diagnosis not present

## 2022-08-01 LAB — COMPREHENSIVE METABOLIC PANEL
ALT: 22 U/L (ref 0–44)
AST: 26 U/L (ref 15–41)
Albumin: 2.4 g/dL — ABNORMAL LOW (ref 3.5–5.0)
Alkaline Phosphatase: 88 U/L (ref 38–126)
Anion gap: 8 (ref 5–15)
BUN: 20 mg/dL (ref 8–23)
CO2: 28 mmol/L (ref 22–32)
Calcium: 8.8 mg/dL — ABNORMAL LOW (ref 8.9–10.3)
Chloride: 100 mmol/L (ref 98–111)
Creatinine, Ser: 1.47 mg/dL — ABNORMAL HIGH (ref 0.61–1.24)
GFR, Estimated: 46 mL/min — ABNORMAL LOW (ref >60)
Glucose, Bld: 152 mg/dL — ABNORMAL HIGH (ref 70–99)
Potassium: 3.9 mmol/L (ref 3.5–5.1)
Sodium: 136 mmol/L (ref 135–145)
Total Bilirubin: 0.7 mg/dL (ref 0.3–1.2)
Total Protein: 6.1 g/dL — ABNORMAL LOW (ref 6.5–8.1)

## 2022-08-01 LAB — CBC
HCT: 33.4 % — ABNORMAL LOW (ref 39.0–52.0)
Hemoglobin: 10.4 g/dL — ABNORMAL LOW (ref 13.0–17.0)
MCH: 31.5 pg (ref 26.0–34.0)
MCHC: 31.1 g/dL (ref 30.0–36.0)
MCV: 101.2 fL — ABNORMAL HIGH (ref 80.0–100.0)
Platelets: 118 10*3/uL — ABNORMAL LOW (ref 150–400)
RBC: 3.3 MIL/uL — ABNORMAL LOW (ref 4.22–5.81)
RDW: 13.9 % (ref 11.5–15.5)
WBC: 5.4 10*3/uL (ref 4.0–10.5)
nRBC: 0 % (ref 0.0–0.2)

## 2022-08-01 MED ORDER — PNEUMOCOCCAL 20-VAL CONJ VACC 0.5 ML IM SUSY
0.5000 mL | PREFILLED_SYRINGE | INTRAMUSCULAR | Status: AC
  Administered 2022-08-02: 0.5 mL via INTRAMUSCULAR
  Filled 2022-08-01: qty 0.5

## 2022-08-01 NOTE — Progress Notes (Signed)
Palliative Medicine Inpatient Follow Up Note     Chart Reviewed. Patient assessed at the bedside. He is resting comfortably. Easily aroused. Patient's son [name]) at bedside. Shares patient has been alert today and lucid. Family is appreciative of this.   Fentanyl patch initiated on yesterday. He is tolerating well. No increase drowsiness or confusion. Son reports patient seems somewhat more comfortable today. Has not complained as much. Topical cream being applied.   [name] expresses family is remaining hopeful, He is remaining hopeful for some improvement/stability. His hope that Mr. Kazee will be able to participate comfortably in therapy and be a candidate for SNF rehab placement. He becomes tearful. Emotional support provided. Wayne cannot "give up" on his father. Is praying he pulls through. He does acknowledge tearfully that he knows their desires are a stretch and it may not be reality however not prepared to consider other options yet. Emotional support provided.   Discussed the importance of continued conversation with family and their  medical providers regarding overall plan of care and treatment options, ensuring decisions are within the context of the patients values and GOCs.   Questions addressed and support provided.    Objective Assessment: Vital Signs Vitals:   08/01/22 0312 08/01/22 1333  BP: 138/66 136/75  Pulse: 93 97  Resp: 20 18  Temp: 98.9 F (37.2 C) 97.9 F (36.6 C)  SpO2: 96% 92%    Intake/Output Summary (Last 24 hours) at 08/01/2022 1705 Last data filed at 08/01/2022 1300 Gross per 24 hour  Intake 1167.83 ml  Output 2100 ml  Net -932.17 ml    Last Weight  Most recent update: 08/01/2022  5:46 AM    Weight  88.9 kg (195 lb 15.8 oz)            Gen: Somnolent but easily aroused, chronically ill-appearing CV: Regular rate and rhythm, no murmurs rubs or gallops PULM: Coarse bilaterally ABD: soft/nontender/nondistended/normal bowel sounds,  hernia EXT: No edema SKIN: Thin, dry, bilateral heel protectors Neuro: Alert and oriented x3, able to somewhat engage in discussions when awake  SUMMARY [place] with current plan of care per medical team  Extensive goals of care discussions on 1/17.  Patient and family clear and expressed wishes to continue to treat the treatable at this time allowing family time to engage in ongoing discussions amongst themselves.  We discussed best case and worst-case scenario.  Extensive education provided on patient's overall state of health.  Encouraged family to consider patient's quality of life and wishes allowing [place] to be the center and driving factor of their decisions. Son express family is remaining hopeful with understanding patient may not show significant improvement.  Discussions regarding balancing patient's pain regimen allow for comfort and ability to remain awake and participate in therapies per family wishes. Fentanyl patch started on 1/17. Patient tolerating well with no adverse reactions. Per RN and family seems more comfortable today.  PMT will continue to support and follow on as needed basis. Please secure chat for urgent needs.   SYMPTOM MANAGEMENT Pain Management  Fentanyl 12 mcg patch (tolerating) Gabapentin 200 mg 3 times daily Morphine 0.5 mg every 3 hours as needed for severe pain (x 1 dose over the past 24 hours). Constipation  MiraLAX daily (bowel movement today)  Senna S as needed  Discussed with RN.   Time Total: 45 min.   Visit consisted of counseling and education dealing with the complex and emotionally intense issues of symptom management and  palliative care in the setting of serious and potentially life-threatening illness.Greater than 50%  of this time was spent counseling and coordinating care related to the above assessment and plan.  Alda Lea, AGPCNP-BC  Palliative Medicine Team/[facility]    Palliative  Medicine Team providers are available by phone from 7am to 7pm daily and can be reached through the team cell phone. Should this patient require assistance outside of these hours, please call the patient's attending physician.

## 2022-08-01 NOTE — Plan of Care (Signed)
  Problem: Clinical Measurements: Goal: Respiratory complications will improve Outcome: Progressing Goal: Cardiovascular complication will be avoided Outcome: Progressing   Problem: Pain Managment: Goal: General experience of comfort will improve Outcome: Progressing   Problem: Safety: Goal: Ability to remain free from injury will improve Outcome: Progressing   Problem: Skin Integrity: Goal: Risk for impaired skin integrity will decrease Outcome: Progressing   Problem: Nutrition: Goal: Adequate nutrition will be maintained Outcome: Not Progressing

## 2022-08-01 NOTE — Progress Notes (Signed)
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 Zadie Rhine ?  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  Brought to emergency room on 07/25/2022 Found to be hypoxic 88% and was placed initially on a nonrebreather Lactic acid on admission elevated 4.1, BNP 211 COVID and flu negative CXR = RLL pneumonia  2/15: Less responsive?  Meds 2/16: Palliative med consulted and assisting with pain management goals of care 2/17: CXR shows persistent Pna 2/18: Palliative med consulted, now DNR  Hospital-Problem based course  Toxic metabolic encephalopathy 123456 - now on Fentanyl 12 mcg/h, gabapentin 300-->200 3 times daily - Likely related to pneumonia but also hypoglycemia and therefore is on D5 50 cc/H  Postherpetic neuralgia with intractable pain - See above discussion--appreciative of PMT expertise--thank you - Have increased morphine to 0.5 every 3 as needed, patient is on gabapentin now 200 3 times daily  Sepsis not present on admission Aspiration R sided pneumonia with hypoxia and sepsis present on admission - Speech therapy saw the patient recommended dysphagia 3 diet. Nectar thick - ABX changed to broad-spectrum Zosyn 2/15 until 2/20 (10 days) given worsening symptoms, de-escalate in a few days when better overall - Repeat CXR 2/17--as above, Use I-S and Flutter  Elevated troponins in the setting of sinus tach [likely type II demand ischemia] -Prior Left heart cath = mild nonobstructive CAD with  80% marginal branch stenosis  -Medical therapy was recommended at that time only as he was not a good catheterization candidate - Echocardiogram shows EF 40 to 45% global hypokinesis-not candidate for invasive stratification--- hold on cardiology evaluation  Fall and deconditioning - ? SNF - Appreciate palliative expertise in terms of pain management etc.  Aortic stenosis with TAVR in the past - See above, continue aspirin 81 twice weekly  AKI on admission superimposed on CKD 3A -Improved from admission-IVF had to be resumed on 2/15 -see above -Eating 25% of meals  DM TY 2 with hyperglycemia with complication of hypoglycemia this morning - Discontinued completely long-acting insulin as well as short acting as he does not want to be stuck and refuses insulin -CBGs 90s to 130's  OSA on CPAP -Continue CPAP at night as able - Continue oxygen  DVT prophylaxis: Heparin Code Status: DNR confirmed Family Communication: Discussed with sonin detail bedside Await recevoery in the next several days--[facility] SNf with Pallaitve to follow? Disposition:  Status is: Inpatient Remains inpatient appropriate because:   Not ready for the discharge over the weekend-need to see clinical trajectory?      Subjective:  Less sleepy Leg pains>eye No fever chills n/v cp No cough cold chills  Eating minimally but slight improved  Objective: Vitals:   07/31/22 2148 08/01/22 0312 08/01/22 0500 08/01/22 1333  BP: (!) 137/58 138/66  136/75  Pulse: 82 93  97  Resp: 20 20  18  $ Temp: 97.7 F (36.5 C) 98.9 F (37.2 C)  97.9 F (36.6 C)  TempSrc: Oral Axillary  Oral  SpO2: 97% 96%  92%  Weight:  88.9 kg   Height:        Intake/Output Summary (Last 24 hours) at 08/01/2022 1719 Last data filed at 08/01/2022 1300 Gross per 24 hour  Intake 1167.83 ml  Output 2100 ml  Net -932.17 ml    Filed Weights   07/30/22 0500 07/31/22 0620 08/01/22 0500  Weight: 89.8 kg 88.4 kg 88.9 kg     Examination:  Sleepy coherent, dry mucosa -no ict no pallor Coarse rales, poor effort at inspiration S1-S2 no murmur, PVC, 1 deg AVB Abdomen is soft--umbilical hernia Heel protectors on heels  No lower extremity edema   Data Reviewed: personally reviewed   CBC    Component Value Date/Time   WBC 5.4 08/01/2022 0453   RBC 3.30 (L) 08/01/2022 0453   HGB 10.4 (L) 08/01/2022 0453   HGB 10.0 (L) 07/13/2018 1455   HCT 33.4 (L) 08/01/2022 0453   HCT 30.0 (L) 07/13/2018 1455   PLT 118 (L) 08/01/2022 0453   PLT 104 (L) 07/13/2018 1455   MCV 101.2 (H) 08/01/2022 0453   MCV 98 (H) 07/13/2018 1455   MCH 31.5 08/01/2022 0453   MCHC 31.1 08/01/2022 0453   RDW 13.9 08/01/2022 0453   RDW 15.8 (H) 07/13/2018 1455   LYMPHSABS 0.5 (L) 07/30/2022 0529   LYMPHSABS 0.8 07/13/2018 1455   MONOABS 0.4 07/30/2022 0529   EOSABS 0.2 07/30/2022 0529   EOSABS 0.3 07/13/2018 1455   BASOSABS 0.0 07/30/2022 0529   BASOSABS 0.1 07/13/2018 1455      Latest Ref Rng & Units 08/01/2022    4:53 AM 07/31/2022    4:43 AM 07/30/2022    6:58 AM  CMP  Glucose 70 - 99 mg/dL 152  145  141   BUN 8 - 23 mg/dL 20  21  21   $ Creatinine 0.61 - 1.24 mg/dL 1.47  1.51  1.54   Sodium 135 - 145 mmol/L 136  137  140   Potassium 3.5 - 5.1 mmol/L 3.9  4.2  4.4   Chloride 98 - 111 mmol/L 100  101  100   CO2 22 - 32 mmol/L 28  27  23   $ Calcium 8.9 - 10.3 mg/dL 8.8  8.7  8.4   Total Protein 6.5 - 8.1 g/dL 6.1  5.7    Total Bilirubin 0.3 - 1.2 mg/dL 0.7  0.7    Alkaline Phos 38 - 126 U/L 88  74    AST 15 - 41 U/L 26  20    ALT 0 - 44 U/L 22  21       Radiology Studies: DG Chest 2 View  Result Date: 07/31/2022 CLINICAL DATA:  Pneumonia EXAM: CHEST - 2 VIEW COMPARISON:  07/25/2022 FINDINGS: The heart size and mediastinal contours are within normal limits. New, bandlike atelectasis or consolidation at the right lung base, with similar appearance of heterogeneous airspace opacity. The visualized skeletal structures are  unremarkable. IMPRESSION: New, bandlike atelectasis or consolidation at the right lung base, with similar appearance of heterogeneous airspace opacity. Electronically Signed   By: Delanna Ahmadi M.D.   On: 07/31/2022 13:58     Scheduled Meds:  amLODipine  10 mg Oral Daily   aspirin  81 mg Oral Once per day on Mon Thu   dorzolamide-timolol  1 drop Left Eye BID   fentaNYL  1 patch Transdermal Q72H   finasteride  5 mg Oral Daily   gabapentin  200 mg Oral TID   heparin injection (subcutaneous)  5,000 Units Subcutaneous  Q8H   multivitamin with minerals  1 tablet Oral Q lunch   mouth rinse  15 mL Mouth Rinse 4 times per day   sodium chloride flush  3 mL Intravenous Q12H   Continuous Infusions:  sodium chloride     dextrose 75 mL/hr at 08/01/22 0928   piperacillin-tazobactam (ZOSYN)  IV 3.375 g (08/01/22 1530)     LOS: 7 days   Time spent: [place], MD [facility] To contact the attending provider between 7A-7P or the covering provider during after hours 7P-7A, please log into the web site [email or web address] and access using universal [place] password for that web site. If you do not have the password, please call the hospital operator.  08/01/2022, 5:19 PM

## 2022-08-02 DIAGNOSIS — J189 Pneumonia, unspecified organism: Secondary | ICD-10-CM | POA: Diagnosis not present

## 2022-08-02 LAB — CBC
HCT: 37.8 % — ABNORMAL LOW (ref 39.0–52.0)
Hemoglobin: 12 g/dL — ABNORMAL LOW (ref 13.0–17.0)
MCH: 32.2 pg (ref 26.0–34.0)
MCHC: 31.7 g/dL (ref 30.0–36.0)
MCV: 101.3 fL — ABNORMAL HIGH (ref 80.0–100.0)
Platelets: 138 10*3/uL — ABNORMAL LOW (ref 150–400)
RBC: 3.73 MIL/uL — ABNORMAL LOW (ref 4.22–5.81)
RDW: 13.9 % (ref 11.5–15.5)
WBC: 5.9 10*3/uL (ref 4.0–10.5)
nRBC: 0 % (ref 0.0–0.2)

## 2022-08-02 LAB — COMPREHENSIVE METABOLIC PANEL
ALT: 24 U/L (ref 0–44)
AST: 28 U/L (ref 15–41)
Albumin: 2.5 g/dL — ABNORMAL LOW (ref 3.5–5.0)
Alkaline Phosphatase: 92 U/L (ref 38–126)
Anion gap: 11 (ref 5–15)
BUN: 17 mg/dL (ref 8–23)
CO2: 29 mmol/L (ref 22–32)
Calcium: 9.1 mg/dL (ref 8.9–10.3)
Chloride: 100 mmol/L (ref 98–111)
Creatinine, Ser: 1.47 mg/dL — ABNORMAL HIGH (ref 0.61–1.24)
GFR, Estimated: 46 mL/min — ABNORMAL LOW (ref >60)
Glucose, Bld: 144 mg/dL — ABNORMAL HIGH (ref 70–99)
Potassium: 4.1 mmol/L (ref 3.5–5.1)
Sodium: 140 mmol/L (ref 135–145)
Total Bilirubin: 0.7 mg/dL (ref 0.3–1.2)
Total Protein: 6.4 g/dL — ABNORMAL LOW (ref 6.5–8.1)

## 2022-08-02 LAB — GLUCOSE, CAPILLARY
Glucose-Capillary: 142 mg/dL — ABNORMAL HIGH (ref 70–99)
Glucose-Capillary: 125 mg/dL — ABNORMAL HIGH (ref 70–99)
Glucose-Capillary: 129 mg/dL — ABNORMAL HIGH (ref 70–99)

## 2022-08-02 MED ORDER — AMOXICILLIN-POT CLAVULANATE 250-62.5 MG/5ML PO SUSR
500.0000 mg | Freq: Three times a day (TID) | ORAL | Status: DC
  Administered 2022-08-02 – 2022-08-04 (×7): 500 mg via ORAL
  Filled 2022-08-02 (×9): qty 10

## 2022-08-02 MED ORDER — AMOXICILLIN-POT CLAVULANATE 875-125 MG PO TABS: 1.0000 | ORAL_TABLET | Freq: Two times a day (BID) | ORAL | Status: DC

## 2022-08-02 NOTE — Progress Notes (Signed)
PT Cancellation Note  Patient Details Name: Dois Billing MRN: [id number] DOB: 28-Jun-1931   Cancelled Treatment:    Reason Eval/Treat Not Completed:  Attempted PT tx session-pt declined participation with PT at this time-he wanted to rest and he stated "gabapentin is just kicking in." Will check back another day.    [facility]  Office: [phone]

## 2022-08-02 NOTE — Progress Notes (Addendum)
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 Zadie Rhine ?  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  Brought to emergency room on 07/25/2022 Found to be hypoxic 88% and was placed initially on a nonrebreather Lactic acid on admission elevated 4.1, BNP 211 COVID and flu negative CXR = RLL pneumonia  2/15: Less responsive?  Meds 2/16: Palliative med consulted and assisting with pain management goals of care 2/17: CXR shows persistent Pna 2/18: Palliative med consulted, now DNR  Hospital-Problem based course  Toxic metabolic encephalopathy 123456 - now on Fentanyl 12 mcg/h, gabapentin 300-->200 3 times daily - Likely related to pneumonia  -Stop D5 08/02/2022 and see how he does with diet  Postherpetic neuralgia with intractable pain - See above discussion--appreciative of PMT expertise--thank you - Current meds: Fentanyl patch 12 mcg/h as needed IV morphine low-dose, gabapentin now 200 3 times daily  Sepsis not present on admission Aspiration R sided pneumonia with hypoxia and sepsis present on admission - Speech therapy saw the patient recommended dysphagia 3 diet. Nectar thick - Zosyn --->Augmentin until 2/20 (10 days) - Repeat CXR 2/17--as above, Use I-S and Flutter  Elevated troponins in the setting of sinus tach [likely type II demand ischemia] -Prior Left heart cath = mild nonobstructive CAD with 80% marginal branch stenosis  -Medical therapy was recommended at that time only as he was  not a good catheterization candidate - Echocardiogram shows EF 40 to 45% global hypokinesis-not candidate for invasive stratification--- hold on cardiology evaluation  Fall and deconditioning - ? SNF - Appreciate palliative expertise in terms of pain management etc.  Aortic stenosis with TAVR in the past - See above, continue aspirin 81 twice weekly  AKI on admission superimposed on CKD 3A -Improved from admission- -Eating 25% of meals  DM TY 2 with hyperglycemia with complication of hypoglycemia secondary to not eating through this hospital stay - Discontinued completely long-acting insulin as well as short acting as he does not want to be stuck and refuses insulin -CBGs 90s to 130's no further hypoglycemia -D5 discontinued 2/19  OSA on CPAP -Continue CPAP at night as able - Continue oxygen  DVT prophylaxis: Heparin Code Status: DNR confirmed Family Communication: Discussed with son  Disposition:  Status is: Inpatient Remains inpatient appropriate because:   Need to see that patient can maintain his blood sugar Watch for worsening fever curve now switching to orals  Subjective:  Less sleepy Leg pains>eye No fever chills n/v cp No cough cold chills  Eating minimally but slight improved  Objective: Vitals:   08/01/22 1333 08/01/22 2111 08/02/22 0630 08/02/22 0633  BP: 136/75 (!) 149/85  118/80  Pulse: 97 96  85  Resp: 18 20  20  $ Temp: 97.9 F (36.6 C) 97.7 F (36.5 C)  98.2 F (36.8 C)  TempSrc: Oral Oral  Oral  SpO2: 92% 98%  96%  Weight:   85.4 kg   Height:        Intake/Output Summary (Last 24 hours) at 08/02/2022 1335  Last data filed at 08/02/2022 0700 Gross per 24 hour  Intake 1312.37 ml  Output 1200 ml  Net 112.37 ml    Filed Weights   07/31/22 0620 08/01/22 0500 08/02/22 0630  Weight: 88.4 kg 88.9 kg 85.4 kg    Examination:  More coherent seems more comfortable Anterior laterally no rales rhonchi-seems to be less in distress S1-S2 no  murmur, PVC's Abdomen is soft--umbilical hernia Heel protectors on heels  Trace lower extremity edema   Data Reviewed: personally reviewed   CBC    Component Value Date/Time   WBC 5.9 08/02/2022 0436   RBC 3.73 (L) 08/02/2022 0436   HGB 12.0 (L) 08/02/2022 0436   HGB 10.0 (L) 07/13/2018 1455   HCT 37.8 (L) 08/02/2022 0436   HCT 30.0 (L) 07/13/2018 1455   PLT 138 (L) 08/02/2022 0436   PLT 104 (L) 07/13/2018 1455   MCV 101.3 (H) 08/02/2022 0436   MCV 98 (H) 07/13/2018 1455   MCH 32.2 08/02/2022 0436   MCHC 31.7 08/02/2022 0436   RDW 13.9 08/02/2022 0436   RDW 15.8 (H) 07/13/2018 1455   LYMPHSABS 0.5 (L) 07/30/2022 0529   LYMPHSABS 0.8 07/13/2018 1455   MONOABS 0.4 07/30/2022 0529   EOSABS 0.2 07/30/2022 0529   EOSABS 0.3 07/13/2018 1455   BASOSABS 0.0 07/30/2022 0529   BASOSABS 0.1 07/13/2018 1455      Latest Ref Rng & Units 08/02/2022    4:36 AM 08/01/2022    4:53 AM 07/31/2022    4:43 AM  CMP  Glucose 70 - 99 mg/dL 144  152  145   BUN 8 - 23 mg/dL 17  20  21   $ Creatinine 0.61 - 1.24 mg/dL 1.47  1.47  1.51   Sodium 135 - 145 mmol/L 140  136  137   Potassium 3.5 - 5.1 mmol/L 4.1  3.9  4.2   Chloride 98 - 111 mmol/L 100  100  101   CO2 22 - 32 mmol/L 29  28  27   $ Calcium 8.9 - 10.3 mg/dL 9.1  8.8  8.7   Total Protein 6.5 - 8.1 g/dL 6.4  6.1  5.7   Total Bilirubin 0.3 - 1.2 mg/dL 0.7  0.7  0.7   Alkaline Phos 38 - 126 U/L 92  88  74   AST 15 - 41 U/L 28  26  20   $ ALT 0 - 44 U/L 24  22  21      $ Radiology Studies: No results found.   Scheduled Meds:  amLODipine  10 mg Oral Daily   amoxicillin-clavulanate  1 tablet Oral Q12H   aspirin  81 mg Oral Once per day on Mon Thu   dorzolamide-timolol  1 drop Left Eye BID   fentaNYL  1 patch Transdermal Q72H   finasteride  5 mg Oral Daily   gabapentin  200 mg Oral TID   heparin injection (subcutaneous)  5,000 Units Subcutaneous Q8H   multivitamin with minerals  1 tablet Oral Q lunch   mouth rinse  15 mL Mouth Rinse 4  times per day   pneumococcal 20-valent conjugate vaccine  0.5 mL Intramuscular Tomorrow-1000   sodium chloride flush  3 mL Intravenous Q12H   Continuous Infusions:  sodium chloride     dextrose 50 mL/hr at 08/01/22 2205     LOS: 8 days   Time spent: [place], MD [facility] To contact the attending provider between 7A-7P or the covering provider during after  hours 7P-7A, please log into the web site [email or web address] and access using universal [place] password for that web site. If you do not have the password, please call the hospital operator.  08/02/2022, 1:35 PM

## 2022-08-02 NOTE — Progress Notes (Signed)
Occupational Therapy Treatment Patient Details Name: [name] [name] MRN: [id number] DOB: 1932-01-13 Today's Date: 08/02/2022   History of present illness Patient is a 89 years old male with past medical history of hypertension, aortic stenosis status post TAVR, sleep apnea, GERD, diabetes mellitus type 2, CKD stage III, BPH was brought into the hospital after being found on the floor by his wife after slipping from the chair and was on the ground for a while, it was an unwitnessed fall. X-ray of the chest showed right lower lobe pneumonia.   OT comments  Patient unfortunately not able to show progress this session compared to previous session due to being too lethargic and falling asleep mid-activity. Per son request, OT remained in room to answer questions and assist son with ideas to help pt increase his activity level when son is in room with pt while he is awake. Patient remains limited by pain, profound generalized weakness and very poor activity tolerance along with deficits noted below. Pt continues to demonstrate fair/guarded rehab potential and would benefit from continued skilled OT to increase safety and independence with ADLs and functional transfers to allow pt to return home safely and reduce caregiver burden and fall risk.    Recommendations for follow up therapy are one component of a multi-disciplinary discharge planning process, led by the attending physician.  Recommendations may be updated based on patient status, additional functional criteria and insurance authorization.    Follow Up Recommendations  Skilled nursing-short term rehab (<3 hours/day) (vs home hospice with 24/7 care depending on family/pt wishes)     Assistance Recommended at Discharge Frequent or constant Supervision/Assistance  Patient can return home with the following  Two people to help with walking and/or transfers;Assistance with cooking/housework;Assist for transportation;A lot of help with  bathing/dressing/bathroom;Two people to help with bathing/dressing/bathroom   Equipment Recommendations  None recommended by OT    Recommendations for Other Services      Precautions / Restrictions Precautions Precautions: Fall Precaution Comments: Aspiration. Severe left eye pain. Restrictions Weight Bearing Restrictions: No                                                           ADL either performed or assessed with clinical judgement   ADL         Grooming Details (indicate cue type and reason): Pt declined due to sleeping. Son asking for ways pt may be able to increase his activity.  Recommended encouraging pt to wash his own hands/chest/ anything within easy reach, fold towels or washclothes or cath and throw soft things with son from the bed. Son agreeable to trying this.                               General ADL Comments: Pt refused. 20 min spent with son per son's reques tto answer son's questions re: activity and rehab potential.    Extremity/Trunk Assessment              Vision Ability to See in Adequate Light: 2 Moderately impaired                Cognition Arousal/Alertness: Lethargic  General Comments      Pertinent Vitals/ Pain       Pain Assessment Pain Assessment: Faces Faces Pain Scale: Hurts little more Pain Location: OT held pt's RT hand initially to awaken him. Pt reported that the touch to his hand radiated to his head and hurt for a moment but resolved. Pain Intervention(s): Limited activity within patient's tolerance  Home Living                                          Prior Functioning/Environment              Frequency  Min 2X/week        Progress Toward Goals  OT Goals(current goals can now be found in the care plan section)  Progress towards OT goals: Not progressing toward  goals - comment  Acute Rehab OT Goals Patient Stated Goal: Per son: Give pt a chance to get stronger before moving forward with hospice. OT Goal Formulation: With family Time For Goal Achievement: 08/09/22 Potential to Achieve Goals: [place] Discharge plan remains appropriate    Co-evaluation                 AM-PAC OT "6 Clicks" Daily Activity     Outcome Measure   Help from another person eating meals?: A Lot Help from another person taking care of personal grooming?: A Lot Help from another person toileting, which includes using toliet, bedpan, or urinal?: Total Help from another person bathing (including washing, rinsing, drying)?: Total Help from another person to put on and taking off regular upper body clothing?: Total Help from another person to put on and taking off regular lower body clothing?: Total 6 Click Score: 8    End of Session Equipment Utilized During Treatment: Oxygen  OT Visit Diagnosis: Unsteadiness on feet (R26.81);Pain;Repeated falls (R29.6);Muscle weakness (generalized) (M62.81);History of falling (Z91.81);Feeding difficulties (R63.3) Pain - Right/Left: Left   Activity Tolerance Patient limited by fatigue;Patient limited by pain;Patient limited by lethargy   Patient Left in bed;with call bell/phone within reach;with family/visitor present;with bed alarm set   Nurse Communication  (Discharge plans for possibly tomorrow.)        Time: [id number] OT Time Calculation (min): 22 min  Charges: OT General Charges $OT Visit: 1 Visit OT Treatments $Self Care/Home Management : 8-22 mins  Anderson [place], [place] Office: [phone] 08/02/2022  Julien Girt 08/02/2022, 2:43 PM

## 2022-08-02 NOTE — TOC Progression Note (Addendum)
Transition of Care [facility]) - Progression Note    Patient Details  Name: [name] [name] MRN: [id number] Date of Birth: Oct 27, 1931  Transition of Care [facility]) CM/SW Contact  Graciela Plato, Juliann Pulse, RN Phone Number: 08/02/2022, 11:57 AM  Clinical Narrative:spoke to [facility]) addressed concerns about [facility]-rep t call him with his concerns about their services. Received auth till 08/03/22 [id number].  -12:52p-[facility] declined to accept [facility] aware & will check w/other accepted choices.      Expected Discharge Plan: [place] Barriers to Discharge: Continued Medical Work up  Expected Discharge Plan and Services                                               Social Determinants of Health (SDOH) Interventions SDOH Screenings   Food Insecurity: No Food Insecurity (07/26/2022)  Housing: Low Risk  (07/25/2022)  Transportation Needs: Unmet Transportation Needs (07/25/2022)  Utilities: Not At Risk (07/25/2022)  Depression (PHQ2-9): Low Risk  (06/29/2018)  Tobacco Use: Medium Risk (07/26/2022)    Readmission Risk Interventions     No data to display

## 2022-08-02 NOTE — Care Management Important Message (Signed)
Important Message  Patient Details  Name: [name] [name] Letter given. MRN: [id number] Date of Birth: 19-Jun-1931   Medicare Important Message Given:  Yes     Kerin Salen 08/02/2022, 12:02 PM

## 2022-08-02 NOTE — Progress Notes (Signed)
   08/02/22 1300  Spiritual Encounters  Type of Visit Initial  Care provided to: Pt and family  Referral source Clinical staff  Reason for visit End-of-life  OnCall Visit No  Spiritual Framework  Presenting Themes Meaning/purpose/sources of inspiration;Goals in life/care;Values and beliefs;Significant life change;Caregiving needs;Impactful experiences and emotions  Community/Connection Family  Patient Stress Factors Health changes;Loss;Loss of control;Major life changes  Family Stress Factors Lack of knowledge;Loss of control;Major life changes  Interventions  Spiritual Care Interventions Made Established relationship of care and support;Compassionate presence;Reflective listening;Normalization of emotions;Decision-making support/facilitation;Reconciliation with self/others;Narrative/life review;Explored values/beliefs/practices/strengths;Meaning making;Mindfulness intervention  Intervention Outcomes  Outcomes Connection to spiritual care;Awareness around self/spiritual resourses;Connection to values and goals of care;Awareness of support;Reduced fear;Reduced anxiety;Patient family open to resources

## 2022-08-03 DIAGNOSIS — J189 Pneumonia, unspecified organism: Secondary | ICD-10-CM | POA: Diagnosis not present

## 2022-08-03 LAB — GLUCOSE, CAPILLARY
Glucose-Capillary: 120 mg/dL — ABNORMAL HIGH (ref 70–99)
Glucose-Capillary: 128 mg/dL — ABNORMAL HIGH (ref 70–99)
Glucose-Capillary: 133 mg/dL — ABNORMAL HIGH (ref 70–99)
Glucose-Capillary: 137 mg/dL — ABNORMAL HIGH (ref 70–99)
Glucose-Capillary: 146 mg/dL — ABNORMAL HIGH (ref 70–99)
Glucose-Capillary: 159 mg/dL — ABNORMAL HIGH (ref 70–99)

## 2022-08-03 MED ORDER — ALBUTEROL SULFATE (2.5 MG/3ML) 0.083% IN NEBU: 2.5000 mg | INHALATION_SOLUTION | RESPIRATORY_TRACT | 12 refills | Status: DC | PRN

## 2022-08-03 MED ORDER — HYDROMORPHONE HCL 1 MG/ML IJ SOLN
0.5000 mg | INTRAMUSCULAR | Status: DC | PRN
  Filled 2022-08-03: qty 0.5

## 2022-08-03 MED ORDER — FENTANYL 25 MCG/HR TD PT72: 1.0000 | MEDICATED_PATCH | TRANSDERMAL | 0 refills | Status: DC

## 2022-08-03 MED ORDER — AMOXICILLIN-POT CLAVULANATE 250-62.5 MG/5ML PO SUSR: 500.0000 mg | Freq: Three times a day (TID) | ORAL | 0 refills | Status: DC

## 2022-08-03 MED ORDER — FENTANYL 25 MCG/HR TD PT72
1.0000 | MEDICATED_PATCH | TRANSDERMAL | Status: DC
  Administered 2022-08-03: 1 via TRANSDERMAL
  Filled 2022-08-03 (×2): qty 1

## 2022-08-03 MED ORDER — GABAPENTIN 100 MG PO CAPS: 200.0000 mg | ORAL_CAPSULE | Freq: Three times a day (TID) | ORAL | 0 refills | Status: DC

## 2022-08-03 MED ORDER — FENTANYL 12 MCG/HR TD PT72: 1.0000 | MEDICATED_PATCH | TRANSDERMAL | 0 refills | Status: DC

## 2022-08-03 NOTE — Progress Notes (Signed)
Chaplain met with [name] [name]'s son, [name] [name], to provide support.  While it was difficult to hear, [name] [name] felt very comforted by the conversation with Dr. Hilma Favors and the potential of moving towards [place] and potentially inpatient hospice.  He asked me to speak with his mother by phone to provide support.  She has a difficult time with mobility, but they want to bring her to spend time with him. When I returned to the room, [name] [name]'s daughter, [name] [name], was at bedside and [name] [name] was speaking with her dad by phone.  [name] [name] shared that he is in such significant pain that it is like "going through hell."  He was initially upset that it took the staff a long time to bring him pain medication when he requests, but [name] [name] and [name] [name] shared that they had requested to limit pain control so that he would be more alert and they could have more meaningful time together.  He was clear that he loved them very much and that they were all "[place] children" but that he was ready to "Check out" and not be in pain anymore.  Chaplain acknowledged the grief of his children and their desire to have more time but affirmed that there would never be enough time to hear all the stories and say all the things but that the love between them spoke the most important things that need to be spoken. Chaplain affirmed with [name] [name] that he understood that keeping his pain well managed might make him less alert.  He very much understands this and affirmed that his main goal right now is pain control. He reported that he was hungry, but that he couldn't eat.  He also reported that he was seeing faces in the room--they were not faces that he recognized--but he began to tell stories of friends with whom he had served in the [employer] who died in service. The family has had significant loss in the past, including 2 of [name] [name]'s grandchildren.  Chaplain provided reflected listening and facilitated difficult decision making as well as grief support for family.  [address], [place] Pager, [phone]

## 2022-08-03 NOTE — Progress Notes (Signed)
[employer] [facility]) Hospital Liaison Note:  Received request for bedside visit with family to discuss Hospice services. Chart reviewed then met with patient and patient daughter Mechele Claude to confirm interest in [place].  Answered questions, supported and gave Mechele Claude our [facility] contact and services information packet. Per request of family, Another bedside meeting was set up for tomorrow at noon with one of our [place] to discuss hospice philosophy, hospice services with the entire family present so they all could be involved in the discussion and decision to precede with hospice support upon discharge.  Hospital Team updated via EPIC chat on above.  Please reach out with any hospice related concerns,  Gar Ponto, RN [place] [facility] (on [place]) [phone]

## 2022-08-03 NOTE — Care Management Important Message (Signed)
Important Message  Patient Details IM Letter given to Patient's Son. Name: [name] [name] MRN: [id number] Date of Birth: 1932/03/11   Medicare Important Message Given:  Yes     Kerin Salen 08/03/2022, 10:33 AM

## 2022-08-03 NOTE — Progress Notes (Signed)
Occupational Therapy Treatment Patient Details Name: [name] [name] MRN: [id number] DOB: [birth year] Today's Date: 08/03/2022   History of present illness Patient is a 89 years old male with past medical history of hypertension, aortic stenosis status post TAVR, sleep apnea, GERD, diabetes mellitus type 2, CKD stage III, BPH was brought into the hospital after being found on the floor by his wife after slipping from the chair and was on the ground for a while, it was an unwitnessed fall. X-ray of the chest showed right lower lobe pneumonia.   OT comments  Patient progressing slowly and showed improved sitting balance to fair with ability to keep his trunk partially upright without assistance, compared to previous session when pt was so stooped that his chest almost touched his thighs while EOB with almost zero balance.  Pt tolerated 7 min EOB sitting and 10 reps each LE HEP while balancing EOB.  Pt required increased assistance for supine to sit today to Dependent of 2 due to hip pain, likely from extended time in bed.   Patient remains limited by pain, profound generalized weakness and decreased activity tolerance along with deficits noted below. Pt continues to demonstrate fair rehab potential with encoruagement and would benefit from continued skilled OT to increase safety and independence with ADLs and functional transfers to allow pt to return home safely and reduce caregiver burden and fall risk.    Recommendations for follow up therapy are one component of a multi-disciplinary discharge planning process, led by the attending physician.  Recommendations may be updated based on patient status, additional functional criteria and insurance authorization.    Follow Up Recommendations  Skilled nursing-short term rehab (<3 hours/day)     Assistance Recommended at Discharge Frequent or constant Supervision/Assistance  Patient can return home with the following  Two people to help with walking and/or  transfers;Assistance with cooking/housework;Assist for transportation;A lot of help with bathing/dressing/bathroom;Two people to help with bathing/dressing/bathroom   Equipment Recommendations  None recommended by OT    Recommendations for Other Services      Precautions / Restrictions Precautions Precautions: Fall Precaution Comments: Aspiration. Severe left eye pain. HOB 45* Restrictions Weight Bearing Restrictions: No       Mobility Bed Mobility Overal bed mobility: Needs Assistance Bed Mobility: Supine to Sit, Sit to Supine     Supine to sit: +2 for physical assistance, Total assist, HOB elevated Sit to supine: Max assist, +2 for physical assistance, HOB elevated        Transfers                         Balance Overall balance assessment: Needs assistance Sitting-balance support: Bilateral upper extremity supported, Feet supported Sitting balance-Leahy Scale: Fair Sitting balance - Comments: Pt tolerated sitting EOB for 7 min and able to keep trunk upright with BUE support on bed. Pt attempted anterior scooting to EOB but unable to mobilize or clear hiops without Total As using draw sheet. Pt repeatedly asking to lie back down but agreeable to keep sitting with encouragement. After 7 min pt insisted "Lay me back down." Postural control: Right lateral lean                                 ADL either performed or assessed with clinical judgement   ADL Overall ADL's : Needs assistance/impaired Eating/Feeding: Bed level;Minimal assistance Eating/Feeding Details (indicate cue type and reason): HOB elevated  to 45 degrees per orders. Pt needs encouragement to feed self, but now participating. Grooming: Moderate assistance;Bed level Grooming Details (indicate cue type and reason): Pt now able to raise hands to face and rub face. Refusing washcloth due to continued face pain.             Lower Body Dressing: Bed level;Total assistance     Toilet  Transfer Details (indicate cue type and reason): Unable to stand. Pt sat EOB for pre-transfer strengthening. Toileting- Clothing Manipulation and Hygiene: Total assistance;+2 for physical assistance;Bed level Toileting - Clothing Manipulation Details (indicate cue type and reason): Using pure wick            Extremity/Trunk Assessment              Vision Ability to See in Adequate Light: 2 Moderately impaired Additional Comments: Improving with ability to open eyes but still guarded due to pain.   Perception     Praxis      Cognition Arousal/Alertness: Lethargic (more alert once EOB) Behavior During Therapy: WFL for tasks assessed/performed Overall Cognitive Status: Within Functional Limits for tasks assessed                                          Exercises Other Exercises Other Exercises: Sitting EOB pt performed long arc quad to each LE x 10 reps. Refused further exercise after this due to fatigue. Pt did agree to one trunk upright stretch to improve postrural control but very quick to fatigue.    Shoulder Instructions       General Comments      Pertinent Vitals/ Pain       Pain Assessment Pain Assessment: Faces Faces Pain Scale: Hurts even more Pain Location: Hip once mobilizing. Pain Descriptors / Indicators: Grimacing, Guarding, Moaning Pain Intervention(s): Limited activity within patient's tolerance, Monitored during session, Repositioned  Home Living                                          Prior Functioning/Environment              Frequency  Min 2X/week        Progress Toward Goals  OT Goals(current goals can now be found in the care plan section)  Progress towards OT goals: Progressing toward goals  Acute Rehab OT Goals Patient Stated Goal: Per son: Continue therapy OT Goal Formulation: With family Time For Goal Achievement: 08/09/22 Potential to Achieve Goals: [place]  Plan Discharge plan remains  appropriate    Co-evaluation                 AM-PAC OT "6 Clicks" Daily Activity     Outcome Measure   Help from another person eating meals?: A Lot Help from another person taking care of personal grooming?: A Lot Help from another person toileting, which includes using toliet, bedpan, or urinal?: Total Help from another person bathing (including washing, rinsing, drying)?: Total Help from another person to put on and taking off regular upper body clothing?: A Lot Help from another person to put on and taking off regular lower body clothing?: Total 6 Click Score: 9    End of Session Equipment Utilized During Treatment: Oxygen  OT Visit Diagnosis: Unsteadiness on feet (R26.81);Pain;Repeated falls (R29.6);Muscle weakness (generalized) (  M62.81);History of falling (Z91.81);Feeding difficulties (R63.3) Pain - part of body: Hip   Activity Tolerance Patient limited by fatigue;Patient limited by pain   Patient Left in bed;with call bell/phone within reach;with family/visitor present;with bed alarm set   Nurse Communication Other (comment) (RN in room for much of session)        Time: [phone] OT Time Calculation (min): 26 min  Charges: OT General Charges $OT Visit: 1 Visit OT Treatments $Self Care/Home Management : 8-22 mins $Therapeutic Activity: 8-22 mins  Anderson [place], [place] Office: [phone] 08/03/2022  Julien Girt 08/03/2022, 9:51 AM

## 2022-08-03 NOTE — Discharge Summary (Addendum)
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 Zadie Rhine ?  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--he was diagnosed according to his son with dysphagia in 2019   Appears to have been dealing with severe headaches from shingles for about a month which was intractable his knees apparently buckled beneath him.   Brought to emergency room on 07/25/2022 Found to be hypoxic 88% and was placed initially on a nonrebreather Lactic acid on admission elevated 4.1, BNP 211 COVID and flu negative CXR = RLL pneumonia   2/15: Less responsive?  Meds 2/16: Palliative med consulted and assisting with pain management goals of care 2/17: CXR shows persistent Pna 2/18: Palliative med consulted, now DNR  Hospital Course:  -Patient had sepsis on admission with right-sided pneumonia - He was recommended  dysphagia 3 diet and was given antibiotics for total of 10 days - He also had toxic metabolic encephalopathy on 2/15 a, curtailed pain meds as it was felt that oxycodone as well as gabapentin are synergistic and causing confusion - He had elevated troponins during hospital stay secondary to demand ischemia--prior cath showed mildly nonobstructive CAD and he was only candidate for medical management ---an echocardiogram this admission showing EF 40-45% - He was quite hypoglycemic during hospital stay unable to keep his sugars up and was placed on D5 until 2/19  Palliative care had a consolidative discussion with the patient on 2/20-it was noted that the patient was responsive and cognizant of decision-making capabilities and that he wanted to "stop all aggressive measures" as per palliative medicine Dr. Hilma Favors  As such hospice agency was contracted they will come and talk with the patient discussed goals of care with the entire family and consolidate the plan to discharge to freestanding hospice for symptom management  Hospice liaison is to follow-up with family 2/21 to discuss referral and what hospice services entails  Portable DNR form has been signed prescription written for controlled substances fentanyl and gabapentin and patient is stable for transfer and discharge to hospice facility for end-of-life care discharge for end-of-life care    Discharge Exam: Vitals:   08/03/22 0413 08/03/22 1522  BP: 121/67 (!) 130/58  Pulse: (!) 105 93  Resp: 16 19  Temp: (!) 97.5 F (36.4 C) 98 F (36.7 C)  SpO2: 96% 91%    Subj on day of d/c   Awake coherent pleasant Wheeze across posterior aspect of lung fields S1-S2 no murmur slight tachycardia  Abdomen soft no rebound no guarding No lower extremity edema, removal of heel protectors showed no denudation of skin I did not examine sacrum He is still on oxygen about 2 L   Discharge Instructions   Discharge Instructions     Diet - low  sodium heart healthy   Complete by: As directed    Increase activity slowly   Complete by: As directed    Increase activity slowly   Complete by: As directed    No wound care   Complete by: As directed    No wound care   Complete by: As directed       Allergies as of 08/03/2022       Reactions   Indocin [indomethacin] Other (See Comments)   Antiinflammatory medication for gout ? Indocin > black out per patient ? SYNCOPE ?        Medication List     STOP taking these medications    amLODipine 10 MG tablet Commonly known as: NORVASC   aspirin 81 MG tablet   atorvastatin 80 MG tablet Commonly known as: LIPITOR   HYDROcodone-acetaminophen 5-325 MG tablet Commonly known as: NORCO/VICODIN   multivitamin with minerals Tabs tablet   nitroGLYCERIN 0.4 MG SL tablet Commonly known as: NITROSTAT   Tresiba FlexTouch 100 UNIT/ML FlexTouch Pen Generic drug: insulin degludec       TAKE these medications    acetaminophen 325 MG tablet Commonly known as: TYLENOL Take 2 tablets (650 mg total) by mouth every 6 (six) hours as needed for mild pain.   albuterol (2.5 MG/3ML) 0.083% nebulizer solution Commonly known as: PROVENTIL Take 3 mLs (2.5 mg total) by nebulization every 4 (four) hours as needed for wheezing or shortness of breath.   carboxymethylcellulose 0.5 % Soln Commonly known as: REFRESH PLUS Place 1 drop into both eyes daily as needed (dry eyes).   dorzolamide-timolol 2-0.5 % ophthalmic solution Commonly known as: COSOPT Place 1 drop into the left eye 2 (two) times daily.   fentaNYL 25 MCG/HR Commonly known as: Carbon 1 patch onto the skin every 3 (three) days. Start taking on: August 06, 2022   finasteride 5 MG tablet Commonly known as: PROSCAR Take 5 mg by mouth daily.   gabapentin 100 MG capsule Commonly known as: NEURONTIN Take 2 capsules (200 mg total) by mouth 3 (three) times daily. What changed: how much to take   hydrocortisone  cream 1 % Apply 1 application topically daily as needed for itching.   triamcinolone cream 0.1 % Commonly known as: KENALOG Apply 1 Application topically 2 (two) times daily as needed (rash- itching).       Allergies  Allergen Reactions   Indocin [Indomethacin] Other (See Comments)    Antiinflammatory medication for gout ? Indocin > black out per patient ? SYNCOPE ?      The results of significant diagnostics from this hospitalization (including imaging, microbiology, ancillary and laboratory) are listed below for reference.    Significant Diagnostic Studies: DG Chest 2 View  Result Date: 07/31/2022 CLINICAL DATA:  Pneumonia EXAM: CHEST - 2 VIEW COMPARISON:  07/25/2022 FINDINGS: The heart size and mediastinal contours are within normal limits. New, bandlike atelectasis or consolidation at the right lung base, with similar appearance of heterogeneous airspace opacity. The visualized skeletal structures are unremarkable. IMPRESSION: New, bandlike atelectasis or consolidation at the right lung base, with similar appearance of heterogeneous airspace opacity. Electronically Signed   By: Delanna Ahmadi M.D.   On: 07/31/2022 13:58  ECHOCARDIOGRAM COMPLETE  Result Date: 07/28/2022    ECHOCARDIOGRAM REPORT   Patient Name:   Bee Reeb Date of Exam: 07/28/2022 Medical Rec #:  [id number]  Height:       69.0 in Accession #:    [id number] Weight:       201.1 lb Date of Birth:  1932-04-20  BSA:          2.071 m Patient Age:    35 years   BP:           136/59 mmHg Patient Gender: M          HR:           76 bpm. Exam Location:  Inpatient Procedure: 2D Echo and Intracardiac Opacification Agent Indications:    dyspnea  History:        Patient has prior history of Echocardiogram examinations, most                 recent 06/01/2019. TAVR; Risk Factors:Hypertension, Dyslipidemia                 and Diabetes.                 Aortic Valve: Sapien prosthetic, stented (TAVR) valve is present                 in the  aortic position.  Sonographer:    Harvie Junior Referring Phys: [id number] [place]  Sonographer Comments: Technically difficult study due to poor echo windows and patient is obese. Image acquisition challenging due to patient body habitus and Image acquisition challenging due to respiratory motion. IMPRESSIONS  1. Left ventricular ejection fraction, by estimation, is 45 to 50%. The left ventricle has mildly decreased function. The left ventricle demonstrates global hypokinesis. There is mild concentric left ventricular hypertrophy. Left ventricular diastolic parameters are consistent with Grade I diastolic dysfunction (impaired relaxation).  2. Right ventricular systolic function is normal. The right ventricular size is normal.  3. The mitral valve is normal in structure. Trivial mitral valve regurgitation. No evidence of mitral stenosis.  4. The aortic valve has been repaired/replaced. Central Aortic valve regurgitation is mild. No aortic stenosis is present. There is a Sapien prosthetic (TAVR) valve present in the aortic position. Aortic valve area, by VTI measures 1.42 cm. Aortic valve mean gradient measures 9.0 mmHg. Aortic valve Vmax measures 2.04 m/s. DVI 0.41.  5. The inferior vena cava is normal in size with greater than 50% respiratory variability, suggesting right atrial pressure of 3 mmHg.  6. Compared to study dated 06/01/19 there is no significant change FINDINGS  Left Ventricle: Left ventricular ejection fraction, by estimation, is 45 to 50%. The left ventricle has mildly decreased function. The left ventricle demonstrates global hypokinesis. Definity contrast agent was given IV to delineate the left ventricular  endocardial borders. The left ventricular internal cavity size was normal in size. There is mild concentric left ventricular hypertrophy. Abnormal (paradoxical) septal motion, consistent with left bundle branch block. Left ventricular diastolic parameters are consistent with Grade I  diastolic dysfunction (impaired relaxation). Normal left ventricular filling pressure. Right Ventricle: The right ventricular size is normal. No increase in right ventricular wall thickness. Right ventricular systolic function is normal. Left Atrium: Left atrial size was normal in size. Right Atrium: Right atrial size was normal in size. Pericardium: There is no evidence of pericardial effusion. Mitral Valve: The mitral valve is normal in structure. Trivial mitral valve regurgitation. No evidence of mitral valve stenosis.  Tricuspid Valve: The tricuspid valve is normal in structure. Tricuspid valve regurgitation is not demonstrated. No evidence of tricuspid stenosis. Aortic Valve: The aortic valve has been repaired/replaced. Aortic valve regurgitation is mild. No aortic stenosis is present. Aortic valve mean gradient measures 9.0 mmHg. Aortic valve peak gradient measures 16.6 mmHg. Aortic valve area, by VTI measures 1.42 cm. There is a Sapien prosthetic, stented (TAVR) valve present in the aortic position. Pulmonic Valve: The pulmonic valve was normal in structure. Pulmonic valve regurgitation is not visualized. No evidence of pulmonic stenosis. Aorta: The aortic root is normal in size and structure. Venous: The inferior vena cava is normal in size with greater than 50% respiratory variability, suggesting right atrial pressure of 3 mmHg. IAS/Shunts: No atrial level shunt detected by color flow Doppler.  LEFT VENTRICLE PLAX 2D LVIDd:         3.90 cm      Diastology LVIDs:         3.00 cm      LV e' medial:    4.68 cm/s LV PW:         1.30 cm      LV E/e' medial:  16.4 LV IVS:        1.40 cm      LV e' lateral:   8.05 cm/s LVOT diam:     2.10 cm      LV E/e' lateral: 9.5 LV SV:         59 LV SV Index:   29 LVOT Area:     3.46 cm  LV Volumes (MOD) LV vol d, MOD A2C: 153.0 ml LV vol d, MOD A4C: 155.0 ml LV vol s, MOD A2C: 84.6 ml LV vol s, MOD A4C: 80.9 ml LV SV MOD A2C:     68.4 ml LV SV MOD A4C:     155.0 ml LV SV  MOD BP:      74.2 ml RIGHT VENTRICLE TAPSE (M-mode): 2.0 cm LEFT ATRIUM             Index LA diam:        3.60 cm 1.74 cm/m LA Vol (A2C):   38.5 ml 18.59 ml/m LA Vol (A4C):   36.0 ml 17.39 ml/m LA Biplane Vol: 37.4 ml 18.06 ml/m  AORTIC VALVE                     PULMONIC VALVE AV Area (Vmax):    1.30 cm      PV Vmax:       0.59 m/s AV Area (Vmean):   1.34 cm      PV Peak grad:  1.4 mmHg AV Area (VTI):     1.42 cm AV Vmax:           203.50 cm/s AV Vmean:          145.000 cm/s AV VTI:            0.417 m AV Peak Grad:      16.6 mmHg AV Mean Grad:      9.0 mmHg LVOT Vmax:         76.40 cm/s LVOT Vmean:        56.000 cm/s LVOT VTI:          0.171 m LVOT/AV VTI ratio: 0.41 MITRAL VALVE MV Area (PHT): 3.08 cm     SHUNTS MV Decel Time: 246 msec     Systemic VTI:  0.17 m MR Peak grad: 23.1 mmHg  Systemic Diam: 2.10 cm MR Vmax:      240.50 cm/s MV E velocity: 76.70 cm/s MV A velocity: 122.00 cm/s MV E/A ratio:  0.63 Fransico Him MD Electronically signed by Fransico Him MD Signature Date/Time: 07/28/2022/11:06:11 AM    Final    DG Swallowing Func-Speech Pathology  Result Date: 07/28/2022 Table formatting from the original result was not included. Modified Barium Swallow Study Patient Details Name: Benancio  MRN: [id number] Date of Birth: Apr 21, 1932 Today's Date: 07/28/2022 HPI/PMH: HPI: Patient is an 89 y.o. male with PMH: HTN, aortic stenosis status post TAVR, sleep apnea, GERD, diabetes mellitus type 2, CKD stage III, BPH, hiatal hernia (daughter reports it is large but he is not surgical candidate, remote smoker. He presented to the hospital on 07/25/22 after being found on the floor by his wife; unwitnessed fall slipping from chair onto floor. CXR showed right LL PNA.  Swallow eval was done 2/12 and reordered due to pt having coughing with liquids.  MBS advised - and ordered but xray unable to conduct this pm - therefore BSE completed. MBS 07/28/2022.  Clinical Impression Patient presents with moderately severe  oropharyngeal dysphagia with sensorimotor deficits.  His oral difficulties are characterized by disorganized and weak lingual control resulting in loss of partial bolus into pharynx/larynx and oral retention.  Pharyngeal dysphagia evidenced by weakness of pharyngeal musculature as well as obstruction due to anterior cervical spine curvature preventing adequate epiglottic deflection.  Aforementioned deficits allow mild to moderate vallecular retention as well as silent aspiration of thin liquids as well as suspected aspiration of nectar in A-P view *suboptimal* as pt overtly coughed post-nectar swallow while leaning to the left.  When returned to A-P view, he had barium in his trachea that was not present prior to A-P view.  In addition, pt is aspirating secretions as barium adheres to secretions.  Various compensations attempted but not effective included effortful swallow, head turn right *retention on right more than left in pharynx in A-P view.   Following solid with nectar thick liquid swallow and cued cough and expectoration effective to help remove partial retention and penetrates - but not aspirates.  Recommend pt continue to cough and expectorate frequently.  Given results of MBS, if he is coughing with intake - he is aspirating.  Mr Nanna has had dysphagia dating back to at least 2009 per esophagram and information gleaned from BSE 07/27/2022.  His progressive decline with deconditioning has exacerbated baseline dysphagia and his compromised mobility likely contributed to aspiration pna.  Recommend pt diet be Dys2/Nectar and allow thin water via tsp any time *pt did not aspirate thin via tsp)  SLP will follow up for dysphagia management, exercises to improve pharyngeal motility for maximal efficiency and comfort with po intake. Recommend to consider consulting palliative care to help pt establish his [place] given report of progressive decline, weight loss, dysphagia and current illness. Factors that may  increase risk of adverse event in presence of aspiration ([place] 2021): Factors that may increase risk of adverse event in presence of aspiration ([place] 2021): Reduced saliva; Frail or deconditioned; Limited mobility; Reduced cognitive function; Dependence for feeding and/or oral hygiene; Inadequate oral hygiene Recommendations/Plan: Swallowing Evaluation Recommendations Swallowing Evaluation Recommendations Recommendations: PO diet PO Diet Recommendation: Dysphagia 2 (Finely chopped); Mildly thick liquids (Level 2, nectar thick) (tsps thin) Liquid Administration via: Cup; Straw (thin via tsp only while acutely sick) Supervision: Staff to assist with self-feeding (family ok to assist) Swallowing strategies  :  Slow rate; Small bites/sips; Follow solids with liquids (cough and expectorate prn, stop po if coughing) Oral care recommendations: Oral care before ice chips/water; Use suctioning for oral care Recommended consults: Consider Palliative care Caregiver Recommendations: Have oral suction available Treatment Plan Treatment Plan Treatment recommendations: Therapy as outlined in treatment plan below Follow-up recommendations: Skilled nursing-short term rehab (<3 hours/day) Functional status assessment: Patient has had a recent decline in their functional status and/or demonstrates limited ability to make significant improvements in function in a reasonable and predictable amount of time. Treatment frequency: Min 2x/week Treatment duration: 2 weeks Interventions: Aspiration precaution training; Compensatory techniques; Patient/family education; Respiratory muscle strength training; Diet toleration management by SLP Recommendations Recommendations for follow up therapy are one component of a multi-disciplinary discharge planning process, led by the attending physician.  Recommendations may be updated based on patient status, additional functional criteria and insurance authorization. Assessment:  Orofacial Exam: Orofacial Exam Oral Cavity: Oral Hygiene: WFL Oral Cavity - Dentition: Adequate natural dentition Orofacial Anatomy: WFL Oral Motor/Sensory Function: WFL Anatomy: No data recorded Thin Liquids: Thin Liquids (Level 0) Thin Liquids : Impaired Bolus delivery method: Spoon; Cup Thin Liquid - Impairment: Oral Impairment; Pharyngeal impairment Lip Closure: No labial escape Tongue control during bolus hold: Posterior escape of less than half of bolus Bolus transport/lingual motion: Slow tongue motion Oral residue: Trace residue lining oral structures Location of oral residue : Tongue Initiation of swallow : -- (deep into larynx) Soft palate elevation: Complete Laryngeal elevation: Partial or minimal superior movement and approximation Anterior hyoid excursion: Partial Epiglottic movement: Partial Laryngeal vestibule closure: Incomplete Pharyngeal stripping wave : Present - diminished Pharyngoesophageal segment opening: Partial distention/partial duration, partial obstruction of flow Tongue base retraction: Narrow column of contrast or air between tongue base and PPW Pharyngeal residue: Collection of residue within or on pharyngeal structures Location of pharyngeal residue: Diffuse (>3 areas) Penetration/Aspiration Scale (PAS) score: 8.  Material enters airway, passes BELOW cords without attempt by patient to eject out (silent aspiration)  Mildly Thick Liquids: Mildly thick liquids (Level 2, nectar thick) Mildly thick liquids (Level 2, nectar thick): Impaired Bolus delivery method: Spoon; Cup; Straw Mildly Thick Liquid - Impairment: Oral Impairment; Pharyngeal impairment; Esophageal impairment Lip Closure: No labial escape Tongue control during bolus hold: Cohesive bolus between tongue to palatal seal Bolus transport/lingual motion: Slow tongue motion Oral residue: Trace residue lining oral structures Location of oral residue : Tongue Initiation of swallow : Pyriform sinuses Soft palate elevation: Partial  Laryngeal elevation: Partial or minimal superior movement and approximation Anterior hyoid excursion: Partial Epiglottic movement: Partial Laryngeal vestibule closure: Incomplete Pharyngeal stripping wave : Present - diminished Pharyngoesophageal segment opening: Partial distention/partial duration, partial obstruction of flow Tongue base retraction: Narrow column of contrast or air between tongue base and PPW Pharyngeal residue: Collection of residue within or on pharyngeal structures Location of pharyngeal residue: Diffuse (>3 areas) Penetration/Aspiration Scale (PAS) score: 3.  Material enters airway, remains ABOVE vocal cords and not ejected out; 7.  Material enters airway, passes BELOW cords and not ejected out despite cough attempt by patient (suspect aspiration in A-P view) Esophageal impairment: Esophageal retention  Moderately Thick Liquids: Moderately thick liquids (Level 3, honey thick) Moderately thick liquids (Level 3, honey thick): Impaired Bolus delivery method: Spoon Lip Closure: No labial escape Tongue control during bolus hold: Not tested Bolus transport/lingual motion: Slow tongue motion; Repetitive/disorganized tongue motion Oral residue: Trace residue lining oral structures Location of oral residue : Tongue Initiation of swallow : Valleculae Soft palate  elevation: Complete Laryngeal elevation: Partial or minimal superior movement and approximation Anterior hyoid excursion: Partial Epiglottic movement: Partial Laryngeal vestibule closure: Incomplete Pharyngeal stripping wave : Present - diminished Pharyngoesophageal segment opening: Partial distention/partial duration, partial obstruction of flow Tongue base retraction: Wide column of contrast or air between tongue base and PPW Pharyngeal residue: Trace residue within or on pharyngeal structures Location of pharyngeal residue: Diffuse (>3 areas) Penetration/Aspiration Scale (PAS) score: 1.  Material does not enter airway  Puree: Puree Puree:  Impaired Lip Closure: No labial escape Bolus transport/lingual motion: Repetitive/disorganized tongue motion Oral residue: Trace residue lining oral structures Location of oral residue : Palate; Tongue Initiation of swallow: Valleculae Soft palate elevation: Complete Laryngeal elevation: Partial or minimal superior movement and approximation Anterior hyoid excursion: Partial Epiglottic movement: Partial Laryngeal vestibule closure: Incomplete Pharyngeal stripping wave : Present - complete Pharyngoesophageal segment opening: Complete distension and complete duration, no obstruction of flow Tongue base retraction: Narrow column of contrast or air between tongue base and PPW Pharyngeal residue: Trace residue within or on pharyngeal structures Location of pharyngeal residue: Tongue base Penetration/Aspiration Scale (PAS) score: 1.  Material does not enter airway Esophageal impairment: Esophageal retention Solid: Solid Solid: Impaired Solid - Impairment: Oral Impairment; Pharyngeal impairment Lip Closure: No labial escape Bolus preparation/mastication: Slow prolonged chewing/mashing with complete recollection Oral residue: Residue collection on oral structures Location of oral residue : Tongue Initiation of swallow: Valleculae Soft palate elevation: Complete Laryngeal elevation: Partial or minimal superior movement and approximation Anterior hyoid excursion: Partial Epiglottic movement: Partial Laryngeal vestibule closure: Incomplete Pharyngeal stripping wave : Present - complete Pharyngeal contraction (A/P view only): N/A Pharyngoesophageal segment opening: Partial distention/partial duration, partial obstruction of flow Tongue base retraction: Wide column of contrast or air between tongue base and PPW Pharyngeal residue: Collection of residue within or on pharyngeal structures Location of pharyngeal residue: Valleculae Penetration/Aspiration Scale (PAS) score: 1.  Material does not enter airway Pill: Pill Pill: Not  Tested Compensatory Strategies: Compensatory Strategies Compensatory strategies: Yes Effortful swallow: Ineffective Ineffective Effortful Swallow: Mildly thick liquid (Level 2, nectar thick); Puree Liquid wash: Effective Effective Liquid Wash: Mildly thick liquid (Level 2, nectar thick); Solid (nectar liquid swallow after solid to help with vallecular retention) Right head turn: Ineffective Ineffective Right Head Turn: Mildly thick liquid (Level 2, nectar thick) Other(comment): Effective Effective Other(comment): Mildly thick liquid (Level 2, nectar thick); Solid; Moderately thick liquid (Level 3, honey thick); Puree; Thin liquid (Level 0) (cough and expectorate)   General Information: No data recorded Diet Prior to this Study: Regular; Thin liquids (Level 0)   Temperature : Normal   Respiratory Status: WFL   Supplemental O2: Nasal cannula   History of Recent Intubation: No  Behavior/Cognition: Alert; Cooperative; Pleasant mood Self-Feeding Abilities: Able to self-feed Baseline vocal quality/speech: Hypophonia/low volume Volitional Cough: Able to elicit Volitional Swallow: Able to elicit Exam Limitations: Poor positioning Goal Planning: Prognosis for improved oropharyngeal function: Fair Barriers to Reach Goals: Time post onset; Severity of deficits; Overall medical prognosis (advanced age) No data recorded Patient/Family Stated Goal: daughter, [name], wants pt to get better and advised to concern for swallowing Consulted and agree with results and recommendations: Patient Pain: Pain Assessment Pain Assessment: Faces Faces Pain Scale: 4 Pain Location: left face - shingles= trigeminal neuralgia Pain Descriptors / Indicators: Stabbing; Penetrating; Pins and needles; Tender; Grimacing; Discomfort Pain Intervention(s): Limited activity within patient's tolerance End of Session: Start Time:SLP Start Time (ACUTE ONLY): [id number] Stop Time: SLP Stop Time (ACUTE ONLY): 1710 Time Calculation:SLP  Time Calculation (min) (ACUTE  ONLY): 53 min Charges: SLP Evaluations $ SLP Speech Visit: 1 Visit SLP Evaluations $BSS Swallow: 1 Procedure $Swallowing Treatment: 1 Procedure SLP visit diagnosis: SLP Visit Diagnosis: Dysphagia, oropharyngeal phase (R13.12); Dysphagia, pharyngoesophageal phase (R13.14) Past Medical History: Past Medical History: Diagnosis Date  Arthritis of back   BPH (benign prostatic hyperplasia)   CKD (chronic kidney disease) stage 3, GFR 30-59 ml/min (HCC)   Colon polyps   Diabetes mellitus, type 2 ([place])   Diabetic nephropathy ([place])   Diverticulosis   ED (erectile dysfunction)   Gallstones   GERD (gastroesophageal reflux disease)   Glaucoma   Hiatal hernia   History of gout   History of hiatal hernia   Hx of urinary tract infection   Hypercholesterolemia   Hypertension   Left groin hematoma 07/04/2018  Osteoarthritis   left knee  Pneumonia   PVD (peripheral vascular disease) (HCC)   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  26 mm Edwards Sapien 3 transcatheter heart valve placed via percutaneous right transfemoral approach   Severe aortic stenosis   Sleep apnea   Sleep apnea, obstructive  Past Surgical History: Past Surgical History: Procedure Laterality Date  ABDOMINAL AORTIC ANEURYSM REPAIR  1995  APPENDECTOMY    CATARACT EXTRACTION Bilateral 2003  FEMORAL-POPLITEAL BYPASS GRAFT    left  INTRAOPERATIVE TRANSTHORACIC ECHOCARDIOGRAM  07/04/2018  Procedure: INTRAOPERATIVE TRANSTHORACIC ECHOCARDIOGRAM;  Surgeon: Sherren Mocha, MD;  Location: [facility];  Service: Open Heart Surgery;;  LUMBAR SPINE SURGERY  03/2001  REPLACEMENT TOTAL KNEE Bilateral 2004  RIGHT/LEFT HEART CATH AND CORONARY ANGIOGRAPHY N/A 06/13/2018  Procedure: RIGHT/LEFT HEART CATH AND CORONARY ANGIOGRAPHY;  Surgeon: Martinique, Peter M, MD;  Location: [facility];  Service: Cardiovascular;  Laterality: N/A;  TEE WITHOUT CARDIOVERSION  07/04/2018  Procedure: TRANSESOPHAGEAL ECHOCARDIOGRAM (TEE);  Surgeon: Sherren Mocha, MD;  Location: [facility];   Service: Open Heart Surgery;;  TOTAL KNEE ARTHROPLASTY    bilateral  TRANSCATHETER AORTIC VALVE REPLACEMENT, TRANSFEMORAL  07/04/2018  TRANSCATHETER AORTIC VALVE REPLACEMENT, TRANSFEMORAL N/A 07/04/2018  Procedure: TRANSCATHETER AORTIC VALVE REPLACEMENT, TRANSFEMORAL;  Surgeon: Sherren Mocha, MD;  Location: [facility];  Service: Open Heart Surgery;  Laterality: N/A;  VASECTOMY   [name], MS [facility] SLP Acute Rehab Services Office [phone] [name] 07/28/2022, 10:21 AM  DG Chest Port 1 View  Result Date: 07/25/2022 CLINICAL DATA:  Possible sepsis.  Cough. EXAM: PORTABLE CHEST 1 VIEW COMPARISON:  None Available. FINDINGS: The cardiomediastinal silhouette is unchanged. New RIGHT LOWER lung airspace opacities likely represents pneumonia. There is no evidence of pneumothorax or large pleural effusion. No acute bony abnormalities are identified. IMPRESSION: New RIGHT LOWER lung airspace opacities likely representing pneumonia. Radiographic follow-up to resolution is recommended. Electronically Signed   By: Margarette Canada M.D.   On: 07/25/2022 16:31   CT Head Wo Contrast  Result Date: 07/15/2022 CLINICAL DATA:  Provided history: Headache, new onset EXAM: CT HEAD WITHOUT CONTRAST TECHNIQUE: Contiguous axial images were obtained from the base of the skull through the vertex without intravenous contrast. RADIATION DOSE REDUCTION: This exam was performed according to the departmental dose-optimization program which includes automated exposure control, adjustment of the mA and/or kV according to patient size and/or use of iterative reconstruction technique. COMPARISON:  No pertinent prior exams available for comparison. FINDINGS: Brain: Mild cerebral atrophy. Chronic lacunar infarct within the right basal ganglia. Mild patchy and ill-defined hypoattenuation within the cerebral white matter, nonspecific but compatible with chronic small vessel disease. There is no acute  intracranial hemorrhage. No demarcated cortical  infarct. No extra-axial fluid collection. No evidence of an intracranial mass. No midline shift. Vascular: No hyperdense vessel. Atherosclerotic calcifications. Skull: No fracture or aggressive osseous lesion. Sinuses/Orbits: No mass or acute finding within the imaged orbits. 10 mm mucous retention cyst within a posterior left ethmoid air cell. Minimal mucosal thickening elsewhere within the bilateral ethmoid sinuses. 9 mm mucous retention cyst within the left maxillary sinus. IMPRESSION: 1. No evidence of acute intracranial abnormality. 2. Parenchymal atrophy and chronic small vessel disease. 3. Paranasal sinus disease, as described. Electronically Signed   By: Kellie Simmering D.O.   On: 07/15/2022 14:32    Microbiology: Recent Results (from the past 240 hour(s))  Blood Culture (routine x 2)     Status: None   Collection Time: 07/25/22  3:49 PM   Specimen: BLOOD  Result Value Ref Range Status   Specimen Description   Final    BLOOD BLOOD RIGHT HAND Performed at [facility], [place] [address], [place], [place] [address]    Special Requests   Final    BOTTLES DRAWN AEROBIC AND ANAEROBIC Blood Culture adequate volume Performed at [facility] [address], [place], [place] [address]    Culture   Final    NO GROWTH 5 DAYS Performed at [facility], [place] [address], [place], [place] [address]    Report Status 07/30/2022 FINAL  Final  Resp panel by RT-PCR (RSV, Flu A&B, Covid) Anterior Nasal Swab     Status: None   Collection Time: 07/25/22  3:50 PM   Specimen: Anterior Nasal Swab  Result Value Ref Range Status   SARS Coronavirus 2 by RT PCR NEGATIVE NEGATIVE Final    Comment: (NOTE) SARS-CoV-2 target nucleic acids are NOT DETECTED.  The SARS-CoV-2 RNA is generally detectable in upper respiratory specimens during the acute phase of infection. The lowest concentration of SARS-CoV-2 viral copies this assay can detect is 138 copies/mL. A negative  result does not preclude SARS-Cov-2 infection and should not be used as the sole basis for treatment or other patient management decisions. A negative result may occur with  improper specimen collection/handling, submission of specimen other than nasopharyngeal swab, presence of viral mutation(s) within the areas targeted by this assay, and inadequate number of viral copies(<138 copies/mL). A negative result must be combined with clinical observations, patient history, and epidemiological information. The expected result is Negative.  Fact Sheet for Patients:  EntrepreneurPulse.com.au  Fact Sheet for Healthcare Providers:  IncredibleEmployment.be  This test is no t yet approved or cleared by the [place] FDA and  has been authorized for detection and/or diagnosis of SARS-CoV-2 by FDA under an Emergency Use Authorization (EUA). This EUA will remain  in effect (meaning this test can be used) for the duration of the COVID-19 declaration under Section 564(b)(1) of the Act, 21 U.S.C.section 360bbb-3(b)(1), unless the authorization is terminated  or revoked sooner.       Influenza A by PCR NEGATIVE NEGATIVE Final   Influenza B by PCR NEGATIVE NEGATIVE Final    Comment: (NOTE) The Xpert Xpress SARS-CoV-2/FLU/RSV plus assay is intended as an aid in the diagnosis of influenza from Nasopharyngeal swab specimens and should not be used as a sole basis for treatment. Nasal washings and aspirates are unacceptable for Xpert Xpress SARS-CoV-2/FLU/RSV testing.  Fact Sheet for Patients: EntrepreneurPulse.com.au  Fact Sheet for Healthcare Providers: IncredibleEmployment.be  This test is not yet approved or cleared by the [place]  FDA and has been authorized for detection and/or diagnosis of SARS-CoV-2 by FDA under an Emergency Use Authorization (EUA). This EUA will remain in effect (meaning this test can be used)  for the duration of the COVID-19 declaration under Section 564(b)(1) of the Act, 21 U.S.C. section 360bbb-3(b)(1), unless the authorization is terminated or revoked.     Resp Syncytial Virus by PCR NEGATIVE NEGATIVE Final    Comment: (NOTE) Fact Sheet for Patients: EntrepreneurPulse.com.au  Fact Sheet for Healthcare Providers: IncredibleEmployment.be  This test is not yet approved or cleared by the [place] FDA and has been authorized for detection and/or diagnosis of SARS-CoV-2 by FDA under an Emergency Use Authorization (EUA). This EUA will remain in effect (meaning this test can be used) for the duration of the COVID-19 declaration under Section 564(b)(1) of the Act, 21 U.S.C. section 360bbb-3(b)(1), unless the authorization is terminated or revoked.  Performed at [facility], [place] [address], [place], [place] [address]   Blood Culture (routine x 2)     Status: None   Collection Time: 07/25/22 10:02 PM   Specimen: BLOOD  Result Value Ref Range Status   Specimen Description   Final    BLOOD BLOOD LEFT ARM Performed at [facility] [address] [place], [place] [address]    Special Requests   Final    BOTTLES DRAWN AEROBIC AND ANAEROBIC Blood Culture adequate volume Performed at [facility] [address] [place], [place] [address]    Culture   Final    NO GROWTH 5 DAYS Performed at [facility], [place] [address] [place], [place] [address]    Report Status 07/31/2022 FINAL  Final     Labs: Basic Metabolic Panel: Recent Labs  Lab 07/28/22 0534 07/29/22 0500 07/30/22 0658 07/31/22 0443 08/01/22 0453 08/02/22 0436  NA 142 143 140 137 136 140  K 4.1 4.4 4.4 4.2 3.9 4.1  CL 110 109 100 101 100 100  CO2 23 24 23 27 28 29  $ GLUCOSE 75 72 141* 145* 152* 144*  BUN 32* 25* 21 21 20 17  $ CREATININE 1.58* 1.49* 1.54* 1.51* 1.47* 1.47*  CALCIUM 8.4* 8.7* 8.4*  8.7* 8.8* 9.1  MG 2.1  --   --   --   --   --   PHOS  --  3.0  --   --   --   --    Liver Function Tests: Recent Labs  Lab 07/29/22 0500 07/31/22 0443 08/01/22 0453 08/02/22 0436  AST  --  20 26 28  $ ALT  --  21 22 24  $ ALKPHOS  --  74 88 92  BILITOT  --  0.7 0.7 0.7  PROT  --  5.7* 6.1* 6.4*  ALBUMIN 2.4* 2.3* 2.4* 2.5*   No results for input(s): "LIPASE", "AMYLASE" in the last 168 hours. No results for input(s): "AMMONIA" in the last 168 hours. CBC: Recent Labs  Lab 07/28/22 0534 07/30/22 0529 07/31/22 0443 08/01/22 0453 08/02/22 0436  WBC 5.5 7.8 6.7 5.4 5.9  NEUTROABS  --  6.7  --   --   --   HGB 9.7* 10.6* 10.1* 10.4* 12.0*  HCT 32.1* 34.0* 31.6* 33.4* 37.8*  MCV 105.9* 104.6* 100.6* 101.2* 101.3*  PLT 121* 122* 119* 118* 138*   Cardiac Enzymes: No results for input(s): "CKTOTAL", "CKMB", "CKMBINDEX", "TROPONINI" in the last 168 hours. BNP: BNP (last 3 results) Recent Labs    07/25/22 1549  BNP  211.5*    ProBNP (last 3 results) No results for input(s): "PROBNP" in the last 8760 hours.  CBG: Recent Labs  Lab 08/03/22 0024 08/03/22 0409 08/03/22 0727 08/03/22 1137 08/03/22 1555  GLUCAP 128* 120* 137* 159* 133*       Signed:  Nita Sells MD   [facility] 08/03/2022, 5:24 PM

## 2022-08-03 NOTE — TOC Progression Note (Addendum)
Transition of Care [facility]) - Progression Note    Patient Details  Name: [name] [name] MRN: [id number] Date of Birth: 1932-03-13  [facility] CM/SW Contact  Adlynn Lowenstein, Juliann Pulse, RN Phone Number: 08/03/2022, 9:25 AM  Clinical Narrative: Left vm w/both Wayne(son), & Joanne(dtr) awaiting choice of another ST SNF facility-auth ends today for [facility] PG_facility declined to accept.   -12:46p-Patient appropriate for Residential hospice-Will await time for Wayne(Son) to process & spend time w/patient/famiy prior discussing residential hospice-Nsg to let me know appropriate time.    Expected Discharge Plan: [place] Barriers to Discharge: Continued Medical Work up  Expected Discharge Plan and Services                                               Social Determinants of Health (SDOH) Interventions SDOH Screenings   Food Insecurity: No Food Insecurity (07/26/2022)  Housing: Low Risk  (07/25/2022)  Transportation Needs: Unmet Transportation Needs (07/25/2022)  Utilities: Not At Risk (07/25/2022)  Depression (PHQ2-9): Low Risk  (06/29/2018)  Tobacco Use: Medium Risk (07/26/2022)    Readmission Risk Interventions     No data to display

## 2022-08-03 NOTE — Progress Notes (Signed)
Physical Therapy Treatment/Discharge Note Patient Details Name: [name] [name] MRN: [id number] DOB: September 03, 1931 Today's Date: 08/03/2022   History of Present Illness Patient is a 89 years old male with past medical history of hypertension, aortic stenosis status post TAVR, sleep apnea, GERD, diabetes mellitus type 2, CKD stage III, BPH was brought into the hospital after being found on the floor by his wife after slipping from the chair and was on the ground for a while, it was an unwitnessed fall. X-ray of the chest showed right lower lobe pneumonia.    PT Comments    Pt agreeable to ROM exercises in bed with min encouragement. He did c/o L calf pain with exercises-made RN aware. He reported he was medicated before my arrival-RN confirmed.  Unfortunately, I did not see palliative's progress note before I worked with patient. I reviewed his chart earlier this morning. Will discharge patient from PT caseload-plan is now comfort care.     Recommendations for follow up therapy are one component of a multi-disciplinary discharge planning process, led by the attending physician.  Recommendations may be updated based on patient status, additional functional criteria and insurance authorization.  Follow Up Recommendations  No PT follow up (plan is for comfort care) Can patient physically be transported by private vehicle: No   Assistance Recommended at Discharge Frequent or constant Supervision/Assistance  Patient can return home with the following     Equipment Recommendations  None recommended by PT    Recommendations for Other Services       Precautions / Restrictions Precautions Precautions: Fall Precaution Comments: Aspiration. Severe left eye pain. HOB 45* Restrictions Weight Bearing Restrictions: No     Mobility  Bed Mobility               General bed mobility comments: NT    Transfers                        Ambulation/Gait                    Stairs             Wheelchair Mobility    Modified Rankin (Stroke Patients Only)       Balance                                            Cognition Arousal/Alertness: Lethargic Behavior During Therapy: WFL for tasks assessed/performed Overall Cognitive Status: Within Functional Limits for tasks assessed                                 General Comments: Improved alertness and can follow commands and answer simple questions        Exercises General Exercises - Lower Extremity Ankle Circles/Pumps: AROM, Both, 10 reps Heel Slides: AAROM (L 5 reps, R 10 reps) Hip ABduction/ADduction: AAROM, Both, 10 reps Straight Leg Raises: AAROM, Right, 5 reps    General Comments        Pertinent Vitals/Pain Pain Assessment Pain Assessment: Faces Pain Location: L calf Pain Descriptors / Indicators: Grimacing, Guarding, Moaning Pain Intervention(s): Limited activity within patient's tolerance, Monitored during session    Home Living  Prior Function            PT Goals (current goals can now be found in the care plan section) Progress towards PT goals: Not progressing toward goals - comment (pt is now comfort care)    Frequency           PT Plan Discharge plan needs to be updated (plan is now for comfort care-will d/c from PT caseload)    Co-evaluation              AM-PAC PT "6 Clicks" Mobility   Outcome Measure  Help needed turning from your back to your side while in a flat bed without using bedrails?: Total Help needed moving from lying on your back to sitting on the side of a flat bed without using bedrails?: Total Help needed moving to and from a bed to a chair (including a wheelchair)?: Total   Help needed to walk in hospital room?: Total Help needed climbing 3-5 steps with a railing? : Total 6 Click Score: 5    End of Session   Activity Tolerance: Patient limited by  fatigue;Patient limited by pain Patient left: in bed;with call bell/phone within reach;with bed alarm set         Time: 1147-1155 PT Time Calculation (min) (ACUTE ONLY): 8 min  Charges:  $Therapeutic Exercise: 8-22 mins                         Doreatha Massed, PT Acute Rehabilitation  Office: [phone]

## 2022-08-03 NOTE — Progress Notes (Signed)
Palliative Care Follow-up note  I met with Mr. [name] [name] son [name] and also spoke with his wife by phone today regarding goals of care.  Since admission the patient and his family were hopeful that he would have a meaningful recovery.  However, his mental status has cleared and he is cognizant and decisional today but his body continues to be very weak deconditioned and he has excruciating pain related to neuropathy and degenerative joints.  He is unable to rehab due to pain and symptoms.  He is also mentally and emotionally not at a point where he wants to continue on with aggressive interventions medically and from a rehabilitation standpoint.  I was able to host a conversation between the patient and son at bedside the patient is clearly stating he would like comfort is his primary goal even if that means that he is approaching the end of his life.  At point in the conversation he said that he was ready to die and could no longer continue with his current suffering and loss of function.  He said he knew his family would have a hard time with this but that he himself did not want to continue on his current condition.  We talked about a transition to comfort including hospice care the severity of his symptoms is to the degree that I believe that he would benefit from a inpatient hospice unit for aggressive symptom management explained to the family that in the event that he does stabilize out after that is achieved he may benefit from going home with hospice services following been him there however in his current state I believe that a discharge home would be difficult and that he would be best served with the symptom management that comes along with a hospice inpatient unit.  They live in the [facility] [place] [facility] area and have requested [facility] will be made and that [place] is the most convenient location for them.  I explained to them that a hospice liaison will be reaching out to them to discuss  further aspects of the referral and care that they could receive from hospice services.  The family is in agreement with this plan and this plan is in line with the patient's clearly stated wishes for comfort care and to make the time that he has left the best it can possibly be given the situation with his declining health and loss of function.  Goals: Comfort Care Referral to [facility] referral   Lane Hacker, DO Palliative Medicine

## 2022-08-03 NOTE — Progress Notes (Signed)
Note from palliative medicine greatly appreciated as well as interface between chaplain and hospice liaison with family - Patient is stable for transfer to freestanding hospice when able and [facility] etc.   discharge summary from 2/20 to be updated by rounding physician on 2/21  Verneita Griffes, MD [facility] 5:31 PM

## 2022-08-04 DIAGNOSIS — I10 Essential (primary) hypertension: Secondary | ICD-10-CM | POA: Diagnosis not present

## 2022-08-04 DIAGNOSIS — E1169 Type 2 diabetes mellitus with other specified complication: Secondary | ICD-10-CM

## 2022-08-04 DIAGNOSIS — J189 Pneumonia, unspecified organism: Secondary | ICD-10-CM | POA: Diagnosis not present

## 2022-08-04 LAB — GLUCOSE, CAPILLARY
Glucose-Capillary: 111 mg/dL — ABNORMAL HIGH (ref 70–99)
Glucose-Capillary: 119 mg/dL — ABNORMAL HIGH (ref 70–99)

## 2022-08-04 MED ORDER — SENNOSIDES-DOCUSATE SODIUM 8.6-50 MG PO TABS: 2.0000 | ORAL_TABLET | Freq: Two times a day (BID) | ORAL | Status: DC

## 2022-08-04 MED ORDER — BISACODYL 10 MG RE SUPP
10.0000 mg | Freq: Every day | RECTAL | Status: DC | PRN
  Administered 2022-08-04: 10 mg via RECTAL
  Filled 2022-08-04: qty 1

## 2022-08-04 MED ORDER — POLYETHYLENE GLYCOL 3350 17 G PO PACK: 17.0000 g | PACK | Freq: Every day | ORAL | 0 refills | Status: DC

## 2022-08-04 MED ORDER — SENNOSIDES-DOCUSATE SODIUM 8.6-50 MG PO TABS
2.0000 | ORAL_TABLET | Freq: Two times a day (BID) | ORAL | Status: DC
  Administered 2022-08-04 (×2): 2 via ORAL
  Filled 2022-08-04 (×2): qty 2

## 2022-08-04 MED ORDER — MORPHINE SULFATE 10 MG/5ML PO SOLN
5.0000 mg | ORAL | Status: DC | PRN
  Filled 2022-08-04: qty 0.5

## 2022-08-04 MED ORDER — POLYETHYLENE GLYCOL 3350 17 G PO PACK
17.0000 g | PACK | Freq: Every day | ORAL | Status: DC
  Filled 2022-08-04: qty 1

## 2022-08-04 NOTE — Progress Notes (Signed)
SLP Cancellation Note  Patient Details Name: [name] [name] MRN: [id number] DOB: 29-Oct-1931   Cancelled treatment:       Reason Eval/Treat Not Completed: Other (comment) (pt is now comfort care, will sign off)  [name], MS [facility] SLP Acute Rehab Services Office [phone]   [name] 08/04/2022, 7:53 AM

## 2022-08-04 NOTE — Progress Notes (Signed)
[facility]                                                                               [name] [name], is a 89 y.o. male, DOB - April 05, 1932, [id number] Admit date - 07/25/2022    Outpatient Primary MD for the patient is Donnajean Lopes, MD  LOS - 10  days    Brief summary   Patient is a 89 years old male with past medical history of hypertension, aortic stenosis status post TAVR, sleep apnea, GERD, diabetes mellitus type 2, CKD stage III, BPH was brought into the hospital after being found on the floor by his wife after slipping from the chair and was on the ground for a while, it was an unwitnessed fall..  Patient has been having cough for a week or so and had been suffering from postherpetic facial pain.  In the ED patient was afebrile but tachycardic with  hypoxia on presentation at 88% on room air and was placed on 8 L of nonrebreather mask.    CK level was mildly elevated.  COVID and influenza was negative.  BNP at 211.  Initial lactate was 4.1.  Blood glucose level was elevated.  Patient did have mild leukocytosis at 11.2.X-ray of the chest showed right lower lobe pneumonia.  Blood cultures were sent.  Patient received 501 IV fluid bolus due to borderline low EF and was considered for admission to the hospital for further evaluation and treatment.    Assessment & Plan    Assessment and Plan:   Toxic metabolic encephalopathy:     Post herpetic neuralgia with intractable pain:  Pain control.     Aspiration pneumonia on the right side .  Sepsis present on admission SLP eval recommending dysphagia 3 diet.  Currently on oral antibiotics to complete the course.   Elevated troponins in the setting of sinus tach [likely type II demand ischemia] Prior Left heart cath = mild nonobstructive CAD with 80% marginal branch stenosis  Medical therapy was recommended at that time only as he was not a good catheterization candidate  Echocardiogram shows EF 40 to 45%  global hypokinesis-not candidate for invasive stratification   Palliative care consulted and after further discussions, transitioned to comfort care.  Currently waiting for residential placement.    Estimated body mass index is 28.29 kg/m as calculated from the following:   Height as of this encounter: 5' 9"$  (1.753 m).   Weight as of this encounter: 86.9 kg.  Code Status: DNR DVT Prophylaxis:     Level of Care: Level of care: Progressive Family Communication: family at bedside.   Disposition Plan:     Remains inpatient appropriate:  awaiting residential hospice placement.   Procedures:  None.   Consultants:   Palliative care.   Antimicrobials:   Anti-infectives (From admission, onward)    Start     Dose/Rate Route Frequency Ordered Stop   08/03/22 0000  amoxicillin-clavulanate (AUGMENTIN) 250-62.5 MG/5ML suspension  Status:  Discontinued        500 mg Oral Every 8 hours 08/03/22 1027 08/03/22    08/02/22 1430  amoxicillin-clavulanate (AUGMENTIN) 250-62.5 MG/5ML suspension 500 mg  500 mg Oral Every 8 hours 08/02/22 1339     08/02/22 1315  amoxicillin-clavulanate (AUGMENTIN) 875-125 MG per tablet 1 tablet  Status:  Discontinued        1 tablet Oral Every 12 hours 08/02/22 1217 08/02/22 1339   07/29/22 1500  piperacillin-tazobactam (ZOSYN) IVPB 3.375 g  Status:  Discontinued        3.375 g 12.5 mL/hr over 240 Minutes Intravenous Every 8 hours 07/29/22 1457 08/02/22 1217   07/29/22 1100  levofloxacin (LEVAQUIN) tablet 250 mg  Status:  Discontinued        250 mg Oral Daily 07/29/22 0911 07/29/22 1449   07/28/22 1700  azithromycin (ZITHROMAX) tablet 500 mg  Status:  Discontinued        500 mg Oral Daily 07/28/22 1003 07/28/22 1004   07/28/22 1630  azithromycin (ZITHROMAX) 500 mg in sodium chloride 0.9 % 250 mL IVPB  Status:  Discontinued        500 mg 250 mL/hr over 60 Minutes Intravenous Every 24 hours 07/28/22 1004 07/29/22 0911   07/25/22 1630  cefTRIAXone  (ROCEPHIN) 2 g in sodium chloride 0.9 % 100 mL IVPB  Status:  Discontinued        2 g 200 mL/hr over 30 Minutes Intravenous Every 24 hours 07/25/22 1625 07/29/22 0911   07/25/22 1630  azithromycin (ZITHROMAX) 500 mg in sodium chloride 0.9 % 250 mL IVPB  Status:  Discontinued        500 mg 250 mL/hr over 60 Minutes Intravenous Every 24 hours 07/25/22 1625 07/28/22 1003        Medications  Scheduled Meds:  amLODipine  10 mg Oral Daily   amoxicillin-clavulanate  500 mg Oral Q8H   aspirin  81 mg Oral Once per day on Mon Thu   dorzolamide-timolol  1 drop Left Eye BID   fentaNYL  1 patch Transdermal Q72H   finasteride  5 mg Oral Daily   gabapentin  200 mg Oral TID   polyethylene glycol  17 g Oral Daily   senna-docusate  2 tablet Oral BID   sodium chloride flush  3 mL Intravenous Q12H   Continuous Infusions:  sodium chloride     PRN Meds:.sodium chloride, acetaminophen **OR** acetaminophen, albuterol, bisacodyl, hydrocortisone cream, HYDROmorphone (DILAUDID) injection, morphine CONCENTRATE, nitroGLYCERIN, ondansetron **OR** ondansetron (ZOFRAN) IV, mouth rinse, polyvinyl alcohol, sodium chloride flush    Subjective:   [name] [name] was seen and examined today.  Requesting pain meds and meds for constipation.   Objective:   Vitals:   08/03/22 1522 08/03/22 1953 08/04/22 0456 08/04/22 1258  BP: (!) 130/58 122/66 119/65 116/70  Pulse: 93 92 93 93  Resp: 19 20 20 20  $ Temp: 98 F (36.7 C) 98.5 F (36.9 C) 98.1 F (36.7 C) 97.7 F (36.5 C)  TempSrc: Axillary Oral Oral Axillary  SpO2: 91% 91% 94% 91%  Weight:   86.9 kg   Height:        Intake/Output Summary (Last 24 hours) at 08/04/2022 1352 Last data filed at 08/04/2022 1046 Gross per 24 hour  Intake 150 ml  Output 100 ml  Net 50 ml   Filed Weights   08/02/22 0630 08/03/22 0413 08/04/22 0456  Weight: 85.4 kg 90.3 kg 86.9 kg     Exam General: Alert and oriented x 3, NAD Cardiovascular: S1 S2 auscultated, no murmurs,  RRR Respiratory: Clear to auscultation bilaterally,  Gastrointestinal: Soft, nontender, nondistended, + bowel sounds Ext: no pedal edema bilaterally Neuro: AAOx3,  Skin:  No rashes    Data Reviewed:  I have personally reviewed following labs and imaging studies   CBC Lab Results  Component Value Date   WBC 5.9 08/02/2022   RBC 3.73 (L) 08/02/2022   HGB 12.0 (L) 08/02/2022   HCT 37.8 (L) 08/02/2022   MCV 101.3 (H) 08/02/2022   MCH 32.2 08/02/2022   PLT 138 (L) 08/02/2022   MCHC 31.7 08/02/2022   RDW 13.9 08/02/2022   LYMPHSABS 0.5 (L) 07/30/2022   MONOABS 0.4 07/30/2022   EOSABS 0.2 07/30/2022   BASOSABS 0.0 [id number]     Last metabolic panel Lab Results  Component Value Date   NA 140 08/02/2022   K 4.1 08/02/2022   CL 100 08/02/2022   CO2 29 08/02/2022   BUN 17 08/02/2022   CREATININE 1.47 (H) 08/02/2022   GLUCOSE 144 (H) 08/02/2022   GFRNONAA 46 (L) 08/02/2022   GFRAA 38 (L) 07/13/2018   CALCIUM 9.1 08/02/2022   PHOS 3.0 07/29/2022   PROT 6.4 (L) 08/02/2022   ALBUMIN 2.5 (L) 08/02/2022   BILITOT 0.7 08/02/2022   ALKPHOS 92 08/02/2022   AST 28 08/02/2022   ALT 24 08/02/2022   ANIONGAP 11 08/02/2022    CBG (last 3)  Recent Labs    08/03/22 1949 08/04/22 0417 08/04/22 0727  GLUCAP 146* 111* 119*      Coagulation Profile: No results for input(s): "INR", "PROTIME" in the last 168 hours.   Radiology Studies: No results found.     Hosie Poisson M.D. [facility] 08/04/2022, 1:52 PM  Available via Epic secure chat 7am-7pm After 7 pm, please refer to night coverage provider listed on amion.

## 2022-08-04 NOTE — Discharge Summary (Signed)
Physician Discharge Summary   Patient: [name] [name] MRN: [id number] DOB: 19-Sep-1931  Admit date:     07/25/2022  Discharge date: 08/04/22  Discharge Physician: Hosie Poisson   PCP: Donnajean Lopes, MD   Recommendations at discharge:  Please follow up with hospice MD as recommended.   Discharge Diagnoses: Principal Problem:   Right lower lobe pneumonia Active Problems:   Hypertension   Diabetes mellitus, type 2 (HCC)   Sleep apnea   S/P TAVR (transcatheter aortic valve replacement)   Hypoxia   AKI (acute kidney injury) ([place])   Fall at home, initial encounter   Post herpetic neuralgia    Hospital Course: Patient is a 89 years old male with past medical history of hypertension, aortic stenosis status post TAVR, sleep apnea, GERD, diabetes mellitus type 2, CKD stage III, BPH was brought into the hospital after being found on the floor by his wife after slipping from the chair and was on the ground for a while, it was an unwitnessed fall..  Patient has been having cough for a week or so and had been suffering from postherpetic facial pain.  In the ED patient was afebrile but tachycardic with  hypoxia on presentation at 88% on room air and was placed on 8 L of nonrebreather mask.    CK level was mildly elevated.  COVID and influenza was negative.  BNP at 211.  Initial lactate was 4.1.  Blood glucose level was elevated.  Patient did have mild leukocytosis at 11.2.X-ray of the chest showed right lower lobe pneumonia.  Blood cultures were sent.  Patient received 501 IV fluid bolus due to borderline low EF and was considered for admission to the hospital for further evaluation and treatment.       Assessment and Plan:   Toxic metabolic encephalopathy:        Post herpetic neuralgia with intractable pain:  Pain control.        Aspiration pneumonia on the right side .  Sepsis present on admission SLP eval recommending dysphagia 3 diet.  Currently on oral antibiotics to complete  the course.    Elevated troponins in the setting of sinus tach [likely type II demand ischemia] Prior Left heart cath = mild nonobstructive CAD with 80% marginal branch stenosis  Medical therapy was recommended at that time only as he was not a good catheterization candidate  Echocardiogram shows EF 40 to 45% global hypokinesis-not candidate for invasive stratification     Palliative care consulted and after further discussions, transitioned to comfort care.  Currently waiting for residential placement.      Estimated body mass index is 28.29 kg/m as calculated from the following:   Height as of this encounter: 5' 9"$  (1.753 m).   Weight as of this encounter: 86.9 kg.    Consultants: palliative care  Procedures performed: none.   Disposition: Hospice care Diet recommendation:  Discharge Diet Orders (From admission, onward)     Start     Ordered   08/03/22 0000  Diet - low sodium heart healthy        08/03/22 1724           Dysphagia type 3 thickened Liquid DISCHARGE MEDICATION: Allergies as of 08/04/2022       Reactions   Indocin [indomethacin] Other (See Comments)   Antiinflammatory medication for gout ? Indocin > black out per patient ? SYNCOPE ?        Medication List     STOP taking these medications  amLODipine 10 MG tablet Commonly known as: NORVASC   aspirin 81 MG tablet   atorvastatin 80 MG tablet Commonly known as: LIPITOR   HYDROcodone-acetaminophen 5-325 MG tablet Commonly known as: NORCO/VICODIN   multivitamin with minerals Tabs tablet   nitroGLYCERIN 0.4 MG SL tablet Commonly known as: NITROSTAT   Tresiba FlexTouch 100 UNIT/ML FlexTouch Pen Generic drug: insulin degludec       TAKE these medications    acetaminophen 325 MG tablet Commonly known as: TYLENOL Take 2 tablets (650 mg total) by mouth every 6 (six) hours as needed for mild pain.   albuterol (2.5 MG/3ML) 0.083% nebulizer solution Commonly known as: PROVENTIL Take 3  mLs (2.5 mg total) by nebulization every 4 (four) hours as needed for wheezing or shortness of breath.   carboxymethylcellulose 0.5 % Soln Commonly known as: REFRESH PLUS Place 1 drop into both eyes daily as needed (dry eyes).   dorzolamide-timolol 2-0.5 % ophthalmic solution Commonly known as: COSOPT Place 1 drop into the left eye 2 (two) times daily.   fentaNYL 25 MCG/HR Commonly known as: [place] 1 patch onto the skin every 3 (three) days. Start taking on: August 06, 2022   finasteride 5 MG tablet Commonly known as: PROSCAR Take 5 mg by mouth daily.   gabapentin 100 MG capsule Commonly known as: NEURONTIN Take 2 capsules (200 mg total) by mouth 3 (three) times daily. What changed: how much to take   hydrocortisone cream 1 % Apply 1 application topically daily as needed for itching.   polyethylene glycol 17 g packet Commonly known as: MIRALAX / GLYCOLAX Take 17 g by mouth daily. Start taking on: August 05, 2022   senna-docusate 8.6-50 MG tablet Commonly known as: Senokot-S Take 2 tablets by mouth 2 (two) times daily.   triamcinolone cream 0.1 % Commonly known as: KENALOG Apply 1 Application topically 2 (two) times daily as needed (rash- itching).        Discharge Exam: Filed Weights   08/02/22 0630 08/03/22 0413 08/04/22 0456  Weight: 85.4 kg 90.3 kg 86.9 kg   General exam: Appears calm and comfortable  Respiratory system: Clear to auscultation. Respiratory effort normal. Cardiovascular system: S1 & S2 heard, RRR. No JVD,  Gastrointestinal system: Abdomen is nondistended, soft and nontender.  Central nervous system: Alert and oriented. No focal neurological deficits. Extremities: Symmetric 5 x 5 power. Skin: No rashes, lesions or ulcers Psychiatry: Mood & affect appropriate.    Condition at discharge: fair  The results of significant diagnostics from this hospitalization (including imaging, microbiology, ancillary and laboratory) are listed  below for reference.   Imaging Studies: DG Chest 2 View  Result Date: 07/31/2022 CLINICAL DATA:  Pneumonia EXAM: CHEST - 2 VIEW COMPARISON:  07/25/2022 FINDINGS: The heart size and mediastinal contours are within normal limits. New, bandlike atelectasis or consolidation at the right lung base, with similar appearance of heterogeneous airspace opacity. The visualized skeletal structures are unremarkable. IMPRESSION: New, bandlike atelectasis or consolidation at the right lung base, with similar appearance of heterogeneous airspace opacity. Electronically Signed   By: Delanna Ahmadi M.D.   On: 07/31/2022 13:58   ECHOCARDIOGRAM COMPLETE  Result Date: 07/28/2022    ECHOCARDIOGRAM REPORT   Patient Name:   [name] [name] Date of Exam: 07/28/2022 Medical Rec #:  [id number]  Height:       69.0 in Accession #:    [id number] Weight:       201.1 lb Date of Birth:  09-12-1931  BSA:  2.071 m Patient Age:    30 years   BP:           136/59 mmHg Patient Gender: M          HR:           76 bpm. Exam Location:  Inpatient Procedure: 2D Echo and Intracardiac Opacification Agent Indications:    dyspnea  History:        Patient has prior history of Echocardiogram examinations, most                 recent 06/01/2019. TAVR; Risk Factors:Hypertension, Dyslipidemia                 and Diabetes.                 Aortic Valve: Sapien prosthetic, stented (TAVR) valve is present                 in the aortic position.  Sonographer:    Harvie Junior Referring Phys: [id number] [place]  Sonographer Comments: Technically difficult study due to poor echo windows and patient is obese. Image acquisition challenging due to patient body habitus and Image acquisition challenging due to respiratory motion. IMPRESSIONS  1. Left ventricular ejection fraction, by estimation, is 45 to 50%. The left ventricle has mildly decreased function. The left ventricle demonstrates global hypokinesis. There is mild concentric left ventricular hypertrophy.  Left ventricular diastolic parameters are consistent with Grade I diastolic dysfunction (impaired relaxation).  2. Right ventricular systolic function is normal. The right ventricular size is normal.  3. The mitral valve is normal in structure. Trivial mitral valve regurgitation. No evidence of mitral stenosis.  4. The aortic valve has been repaired/replaced. Central Aortic valve regurgitation is mild. No aortic stenosis is present. There is a Sapien prosthetic (TAVR) valve present in the aortic position. Aortic valve area, by VTI measures 1.42 cm. Aortic valve mean gradient measures 9.0 mmHg. Aortic valve Vmax measures 2.04 m/s. DVI 0.41.  5. The inferior vena cava is normal in size with greater than 50% respiratory variability, suggesting right atrial pressure of 3 mmHg.  6. Compared to study dated 06/01/19 there is no significant change FINDINGS  Left Ventricle: Left ventricular ejection fraction, by estimation, is 45 to 50%. The left ventricle has mildly decreased function. The left ventricle demonstrates global hypokinesis. Definity contrast agent was given IV to delineate the left ventricular  endocardial borders. The left ventricular internal cavity size was normal in size. There is mild concentric left ventricular hypertrophy. Abnormal (paradoxical) septal motion, consistent with left bundle branch block. Left ventricular diastolic parameters are consistent with Grade I diastolic dysfunction (impaired relaxation). Normal left ventricular filling pressure. Right Ventricle: The right ventricular size is normal. No increase in right ventricular wall thickness. Right ventricular systolic function is normal. Left Atrium: Left atrial size was normal in size. Right Atrium: Right atrial size was normal in size. Pericardium: There is no evidence of pericardial effusion. Mitral Valve: The mitral valve is normal in structure. Trivial mitral valve regurgitation. No evidence of mitral valve stenosis. Tricuspid Valve:  The tricuspid valve is normal in structure. Tricuspid valve regurgitation is not demonstrated. No evidence of tricuspid stenosis. Aortic Valve: The aortic valve has been repaired/replaced. Aortic valve regurgitation is mild. No aortic stenosis is present. Aortic valve mean gradient measures 9.0 mmHg. Aortic valve peak gradient measures 16.6 mmHg. Aortic valve area, by VTI measures 1.42 cm. There is a Sapien prosthetic, stented (TAVR) valve present  in the aortic position. Pulmonic Valve: The pulmonic valve was normal in structure. Pulmonic valve regurgitation is not visualized. No evidence of pulmonic stenosis. Aorta: The aortic root is normal in size and structure. Venous: The inferior vena cava is normal in size with greater than 50% respiratory variability, suggesting right atrial pressure of 3 mmHg. IAS/Shunts: No atrial level shunt detected by color flow Doppler.  LEFT VENTRICLE PLAX 2D LVIDd:         3.90 cm      Diastology LVIDs:         3.00 cm      LV e' medial:    4.68 cm/s LV PW:         1.30 cm      LV E/e' medial:  16.4 LV IVS:        1.40 cm      LV e' lateral:   8.05 cm/s LVOT diam:     2.10 cm      LV E/e' lateral: 9.5 LV SV:         59 LV SV Index:   29 LVOT Area:     3.46 cm  LV Volumes (MOD) LV vol d, MOD A2C: 153.0 ml LV vol d, MOD A4C: 155.0 ml LV vol s, MOD A2C: 84.6 ml LV vol s, MOD A4C: 80.9 ml LV SV MOD A2C:     68.4 ml LV SV MOD A4C:     155.0 ml LV SV MOD BP:      74.2 ml RIGHT VENTRICLE TAPSE (M-mode): 2.0 cm LEFT ATRIUM             Index LA diam:        3.60 cm 1.74 cm/m LA Vol (A2C):   38.5 ml 18.59 ml/m LA Vol (A4C):   36.0 ml 17.39 ml/m LA Biplane Vol: 37.4 ml 18.06 ml/m  AORTIC VALVE                     PULMONIC VALVE AV Area (Vmax):    1.30 cm      PV Vmax:       0.59 m/s AV Area (Vmean):   1.34 cm      PV Peak grad:  1.4 mmHg AV Area (VTI):     1.42 cm AV Vmax:           203.50 cm/s AV Vmean:          145.000 cm/s AV VTI:            0.417 m AV Peak Grad:      16.6 mmHg AV  Mean Grad:      9.0 mmHg LVOT Vmax:         76.40 cm/s LVOT Vmean:        56.000 cm/s LVOT VTI:          0.171 m LVOT/AV VTI ratio: 0.41 MITRAL VALVE MV Area (PHT): 3.08 cm     SHUNTS MV Decel Time: 246 msec     Systemic VTI:  0.17 m [name] Peak grad: 23.1 mmHg     Systemic Diam: 2.10 cm [name] Vmax:      240.50 cm/s MV E velocity: 76.70 cm/s MV A velocity: 122.00 cm/s MV E/A ratio:  0.63 Fransico Him MD Electronically signed by Fransico Him MD Signature Date/Time: 07/28/2022/11:06:11 AM    Final    DG Swallowing Func-Speech Pathology  Result Date: 07/28/2022 Table formatting from the original result was not included. Modified  [facility]ium Swallow Study Patient Details Name: [name] [name] MRN: [id number] Date of Birth: 1931/06/24 Today's Date: 07/28/2022 HPI/PMH: HPI: Patient is an 89 y.o. male with PMH: HTN, aortic stenosis status post TAVR, sleep apnea, GERD, diabetes mellitus type 2, CKD stage III, BPH, hiatal hernia (daughter reports it is large but he is not surgical candidate, remote smoker. He presented to the hospital on 07/25/22 after being found on the floor by his wife; unwitnessed fall slipping from chair onto floor. CXR showed right LL PNA.  Swallow eval was done 2/12 and reordered due to pt having coughing with liquids.  MBS advised - and ordered but xray unable to conduct this pm - therefore BSE completed. MBS 07/28/2022.  Clinical Impression Patient presents with moderately severe oropharyngeal dysphagia with sensorimotor deficits.  His oral difficulties are characterized by disorganized and weak lingual control resulting in loss of partial bolus into pharynx/larynx and oral retention.  Pharyngeal dysphagia evidenced by weakness of pharyngeal musculature as well as obstruction due to anterior cervical spine curvature preventing adequate epiglottic deflection.  Aforementioned deficits allow mild to moderate vallecular retention as well as silent aspiration of thin liquids as well as suspected aspiration of nectar in  A-P view *suboptimal* as pt overtly coughed post-nectar swallow while leaning to the left.  When returned to A-P view, he had barium in his trachea that was not present prior to A-P view.  In addition, pt is aspirating secretions as barium adheres to secretions.  Various compensations attempted but not effective included effortful swallow, head turn right *retention on right more than left in pharynx in A-P view.   Following solid with nectar thick liquid swallow and cued cough and expectoration effective to help remove partial retention and penetrates - but not aspirates.  Recommend pt continue to cough and expectorate frequently.  Given results of MBS, if he is coughing with intake - he is aspirating.  [name] [name] has had dysphagia dating back to at least 2009 per esophagram and information gleaned from BSE 07/27/2022.  His progressive decline with deconditioning has exacerbated baseline dysphagia and his compromised mobility likely contributed to aspiration pna.  Recommend pt diet be Dys2/Nectar and allow thin water via tsp any time *pt did not aspirate thin via tsp)  SLP will follow up for dysphagia management, exercises to improve pharyngeal motility for maximal efficiency and comfort with po intake. Recommend to consider consulting palliative care to help pt establish his [place] given report of progressive decline, weight loss, dysphagia and current illness. Factors that may increase risk of adverse event in presence of aspiration ([place] 2021): Factors that may increase risk of adverse event in presence of aspiration ([place] 2021): Reduced saliva; Frail or deconditioned; Limited mobility; Reduced cognitive function; Dependence for feeding and/or oral hygiene; Inadequate oral hygiene Recommendations/Plan: Swallowing Evaluation Recommendations Swallowing Evaluation Recommendations Recommendations: PO diet PO Diet Recommendation: Dysphagia 2 (Finely chopped); Mildly thick liquids (Level 2, nectar  thick) (tsps thin) Liquid Administration via: Cup; Straw (thin via tsp only while acutely sick) Supervision: Staff to assist with self-feeding (family ok to assist) Swallowing strategies  : Slow rate; Small bites/sips; Follow solids with liquids (cough and expectorate prn, stop po if coughing) Oral care recommendations: Oral care before ice chips/water; Use suctioning for oral care Recommended consults: Consider Palliative care Caregiver Recommendations: Have oral suction available Treatment Plan Treatment Plan Treatment recommendations: Therapy as outlined in treatment plan below Follow-up recommendations: Skilled nursing-short term rehab (<3 hours/day) Functional status assessment: Patient has  had a recent decline in their functional status and/or demonstrates limited ability to make significant improvements in function in a reasonable and predictable amount of time. Treatment frequency: Min 2x/week Treatment duration: 2 weeks Interventions: Aspiration precaution training; Compensatory techniques; Patient/family education; Respiratory muscle strength training; Diet toleration management by SLP Recommendations Recommendations for follow up therapy are one component of a multi-disciplinary discharge planning process, led by the attending physician.  Recommendations may be updated based on patient status, additional functional criteria and insurance authorization. Assessment: Orofacial Exam: Orofacial Exam Oral Cavity: Oral Hygiene: WFL Oral Cavity - Dentition: Adequate natural dentition Orofacial Anatomy: WFL Oral Motor/Sensory Function: WFL Anatomy: No data recorded Thin Liquids: Thin Liquids (Level 0) Thin Liquids : Impaired Bolus delivery method: Spoon; Cup Thin Liquid - Impairment: Oral Impairment; Pharyngeal impairment Lip Closure: No labial escape Tongue control during bolus hold: Posterior escape of less than half of bolus Bolus transport/lingual motion: Slow tongue motion Oral residue: Trace residue lining  oral structures Location of oral residue : Tongue Initiation of swallow : -- (deep into larynx) Soft palate elevation: Complete Laryngeal elevation: Partial or minimal superior movement and approximation Anterior hyoid excursion: Partial Epiglottic movement: Partial Laryngeal vestibule closure: Incomplete Pharyngeal stripping wave : Present - diminished Pharyngoesophageal segment opening: Partial distention/partial duration, partial obstruction of flow Tongue base retraction: Narrow column of contrast or air between tongue base and PPW Pharyngeal residue: Collection of residue within or on pharyngeal structures Location of pharyngeal residue: Diffuse (>3 areas) Penetration/Aspiration Scale (PAS) score: 8.  Material enters airway, passes BELOW cords without attempt by patient to eject out (silent aspiration)  Mildly Thick Liquids: Mildly thick liquids (Level 2, nectar thick) Mildly thick liquids (Level 2, nectar thick): Impaired Bolus delivery method: Spoon; Cup; Straw Mildly Thick Liquid - Impairment: Oral Impairment; Pharyngeal impairment; Esophageal impairment Lip Closure: No labial escape Tongue control during bolus hold: Cohesive bolus between tongue to palatal seal Bolus transport/lingual motion: Slow tongue motion Oral residue: Trace residue lining oral structures Location of oral residue : Tongue Initiation of swallow : Pyriform sinuses Soft palate elevation: Partial Laryngeal elevation: Partial or minimal superior movement and approximation Anterior hyoid excursion: Partial Epiglottic movement: Partial Laryngeal vestibule closure: Incomplete Pharyngeal stripping wave : Present - diminished Pharyngoesophageal segment opening: Partial distention/partial duration, partial obstruction of flow Tongue base retraction: Narrow column of contrast or air between tongue base and PPW Pharyngeal residue: Collection of residue within or on pharyngeal structures Location of pharyngeal residue: Diffuse (>3 areas)  Penetration/Aspiration Scale (PAS) score: 3.  Material enters airway, remains ABOVE vocal cords and not ejected out; 7.  Material enters airway, passes BELOW cords and not ejected out despite cough attempt by patient (suspect aspiration in A-P view) Esophageal impairment: Esophageal retention  Moderately Thick Liquids: Moderately thick liquids (Level 3, honey thick) Moderately thick liquids (Level 3, honey thick): Impaired Bolus delivery method: Spoon Lip Closure: No labial escape Tongue control during bolus hold: Not tested Bolus transport/lingual motion: Slow tongue motion; Repetitive/disorganized tongue motion Oral residue: Trace residue lining oral structures Location of oral residue : Tongue Initiation of swallow : Valleculae Soft palate elevation: Complete Laryngeal elevation: Partial or minimal superior movement and approximation Anterior hyoid excursion: Partial Epiglottic movement: Partial Laryngeal vestibule closure: Incomplete Pharyngeal stripping wave : Present - diminished Pharyngoesophageal segment opening: Partial distention/partial duration, partial obstruction of flow Tongue base retraction: Wide column of contrast or air between tongue base and PPW Pharyngeal residue: Trace residue within or on pharyngeal structures Location of pharyngeal residue:  Diffuse (>3 areas) Penetration/Aspiration Scale (PAS) score: 1.  Material does not enter airway  Puree: Puree Puree: Impaired Lip Closure: No labial escape Bolus transport/lingual motion: Repetitive/disorganized tongue motion Oral residue: Trace residue lining oral structures Location of oral residue : Palate; Tongue Initiation of swallow: Valleculae Soft palate elevation: Complete Laryngeal elevation: Partial or minimal superior movement and approximation Anterior hyoid excursion: Partial Epiglottic movement: Partial Laryngeal vestibule closure: Incomplete Pharyngeal stripping wave : Present - complete Pharyngoesophageal segment opening: Complete  distension and complete duration, no obstruction of flow Tongue base retraction: Narrow column of contrast or air between tongue base and PPW Pharyngeal residue: Trace residue within or on pharyngeal structures Location of pharyngeal residue: Tongue base Penetration/Aspiration Scale (PAS) score: 1.  Material does not enter airway Esophageal impairment: Esophageal retention Solid: Solid Solid: Impaired Solid - Impairment: Oral Impairment; Pharyngeal impairment Lip Closure: No labial escape Bolus preparation/mastication: Slow prolonged chewing/mashing with complete recollection Oral residue: Residue collection on oral structures Location of oral residue : Tongue Initiation of swallow: Valleculae Soft palate elevation: Complete Laryngeal elevation: Partial or minimal superior movement and approximation Anterior hyoid excursion: Partial Epiglottic movement: Partial Laryngeal vestibule closure: Incomplete Pharyngeal stripping wave : Present - complete Pharyngeal contraction (A/P view only): N/A Pharyngoesophageal segment opening: Partial distention/partial duration, partial obstruction of flow Tongue base retraction: Wide column of contrast or air between tongue base and PPW Pharyngeal residue: Collection of residue within or on pharyngeal structures Location of pharyngeal residue: Valleculae Penetration/Aspiration Scale (PAS) score: 1.  Material does not enter airway Pill: Pill Pill: Not Tested Compensatory Strategies: Compensatory Strategies Compensatory strategies: Yes Effortful swallow: Ineffective Ineffective Effortful Swallow: Mildly thick liquid (Level 2, nectar thick); Puree Liquid wash: Effective Effective Liquid Wash: Mildly thick liquid (Level 2, nectar thick); Solid (nectar liquid swallow after solid to help with vallecular retention) Right head turn: Ineffective Ineffective Right Head Turn: Mildly thick liquid (Level 2, nectar thick) Other(comment): Effective Effective Other(comment): Mildly thick liquid  (Level 2, nectar thick); Solid; Moderately thick liquid (Level 3, honey thick); Puree; Thin liquid (Level 0) (cough and expectorate)   General Information: No data recorded Diet Prior to this Study: Regular; Thin liquids (Level 0)   Temperature : Normal   Respiratory Status: WFL   Supplemental O2: Nasal cannula   History of Recent Intubation: No  Behavior/Cognition: Alert; Cooperative; Pleasant mood Self-Feeding Abilities: Able to self-feed Baseline vocal quality/speech: Hypophonia/low volume Volitional Cough: Able to elicit Volitional Swallow: Able to elicit Exam Limitations: Poor positioning Goal Planning: Prognosis for improved oropharyngeal function: Fair Barriers to Reach Goals: Time post onset; Severity of deficits; Overall medical prognosis (advanced age) No data recorded Patient/Family Stated Goal: daughter, [name], wants pt to get better and advised to concern for swallowing Consulted and agree with results and recommendations: Patient Pain: Pain Assessment Pain Assessment: Faces Faces Pain Scale: 4 Pain Location: left face - shingles= trigeminal neuralgia Pain Descriptors / Indicators: Stabbing; Penetrating; Pins and needles; Tender; Grimacing; Discomfort Pain Intervention(s): Limited activity within patient's tolerance End of Session: Start Time:SLP Start Time (ACUTE ONLY): [id number] Stop Time: SLP Stop Time (ACUTE ONLY): 1710 Time Calculation:SLP Time Calculation (min) (ACUTE ONLY): 53 min Charges: SLP Evaluations $ SLP Speech Visit: 1 Visit SLP Evaluations $BSS Swallow: 1 Procedure $Swallowing Treatment: 1 Procedure SLP visit diagnosis: SLP Visit Diagnosis: Dysphagia, oropharyngeal phase (R13.12); Dysphagia, pharyngoesophageal phase (R13.14) Past Medical History: Past Medical History: Diagnosis Date  Arthritis of back   BPH (benign prostatic hyperplasia)   CKD (chronic kidney disease) stage 3,  GFR 30-59 ml/min (HCC)   Colon polyps   Diabetes mellitus, type 2 ([place])   Diabetic nephropathy ([place])    Diverticulosis   ED (erectile dysfunction)   Gallstones   GERD (gastroesophageal reflux disease)   Glaucoma   Hiatal hernia   History of gout   History of hiatal hernia   Hx of urinary tract infection   Hypercholesterolemia   Hypertension   Left groin hematoma 07/04/2018  Osteoarthritis   left knee  Pneumonia   PVD (peripheral vascular disease) (HCC)   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  26 mm Edwards Sapien 3 transcatheter heart valve placed via percutaneous right transfemoral approach   Severe aortic stenosis   Sleep apnea   Sleep apnea, obstructive  Past Surgical History: Past Surgical History: Procedure Laterality Date  ABDOMINAL AORTIC ANEURYSM REPAIR  1995  APPENDECTOMY    CATARACT EXTRACTION Bilateral 2003  FEMORAL-POPLITEAL BYPASS GRAFT    left  INTRAOPERATIVE TRANSTHORACIC ECHOCARDIOGRAM  07/04/2018  Procedure: INTRAOPERATIVE TRANSTHORACIC ECHOCARDIOGRAM;  Surgeon: Sherren Mocha, MD;  Location: [place];  Service: Open Heart Surgery;;  LUMBAR SPINE SURGERY  03/2001  REPLACEMENT TOTAL KNEE Bilateral 2004  RIGHT/LEFT HEART CATH AND CORONARY ANGIOGRAPHY N/A 06/13/2018  Procedure: RIGHT/LEFT HEART CATH AND CORONARY ANGIOGRAPHY;  Surgeon: Martinique, Peter M, MD;  Location: [place] [facility];  Service: Cardiovascular;  Laterality: N/A;  TEE WITHOUT CARDIOVERSION  07/04/2018  Procedure: TRANSESOPHAGEAL ECHOCARDIOGRAM (TEE);  Surgeon: Sherren Mocha, MD;  Location: [place];  Service: Open Heart Surgery;;  TOTAL KNEE ARTHROPLASTY    bilateral  TRANSCATHETER AORTIC VALVE REPLACEMENT, TRANSFEMORAL  07/04/2018  TRANSCATHETER AORTIC VALVE REPLACEMENT, TRANSFEMORAL N/A 07/04/2018  Procedure: TRANSCATHETER AORTIC VALVE REPLACEMENT, TRANSFEMORAL;  Surgeon: Sherren Mocha, MD;  Location: [place];  Service: Open Heart Surgery;  Laterality: N/A;  VASECTOMY   [name], MS [facility] SLP Acute Rehab Services Office [phone] [name] 07/28/2022, 10:21 AM  DG Chest Port 1 View  Result Date:  07/25/2022 CLINICAL DATA:  Possible sepsis.  Cough. EXAM: PORTABLE CHEST 1 VIEW COMPARISON:  None Available. FINDINGS: The cardiomediastinal silhouette is unchanged. New RIGHT LOWER lung airspace opacities likely represents pneumonia. There is no evidence of pneumothorax or large pleural effusion. No acute bony abnormalities are identified. IMPRESSION: New RIGHT LOWER lung airspace opacities likely representing pneumonia. Radiographic follow-up to resolution is recommended. Electronically Signed   By: Margarette Canada M.D.   On: 07/25/2022 16:31   CT Head Wo Contrast  Result Date: 07/15/2022 CLINICAL DATA:  Provided history: Headache, new onset EXAM: CT HEAD WITHOUT CONTRAST TECHNIQUE: Contiguous axial images were obtained from the base of the skull through the vertex without intravenous contrast. RADIATION DOSE REDUCTION: This exam was performed according to the departmental dose-optimization program which includes automated exposure control, adjustment of the mA and/or kV according to patient size and/or use of iterative reconstruction technique. COMPARISON:  No pertinent prior exams available for comparison. FINDINGS: Brain: Mild cerebral atrophy. Chronic lacunar infarct within the right basal ganglia. Mild patchy and ill-defined hypoattenuation within the cerebral white matter, nonspecific but compatible with chronic small vessel disease. There is no acute intracranial hemorrhage. No demarcated cortical infarct. No extra-axial fluid collection. No evidence of an intracranial mass. No midline shift. Vascular: No hyperdense vessel. Atherosclerotic calcifications. Skull: No fracture or aggressive osseous lesion. Sinuses/Orbits: No mass or acute finding within the imaged orbits. 10 mm mucous retention cyst within a posterior left ethmoid air cell. Minimal mucosal thickening elsewhere within the bilateral ethmoid sinuses. 9 mm mucous  retention cyst within the left maxillary sinus. IMPRESSION: 1. No evidence of acute  intracranial abnormality. 2. Parenchymal atrophy and chronic small vessel disease. 3. Paranasal sinus disease, as described. Electronically Signed   By: Kellie Simmering D.O.   On: 07/15/2022 14:32    Microbiology: Results for orders placed or performed during the hospital encounter of 07/25/22  Blood Culture (routine x 2)     Status: None   Collection Time: 07/25/22  3:49 PM   Specimen: BLOOD  Result Value Ref Range Status   Specimen Description   Final    BLOOD BLOOD RIGHT HAND Performed at [facility], [place] [address] [place], [place] [address]    Special Requests   Final    BOTTLES DRAWN AEROBIC AND ANAEROBIC Blood Culture adequate volume Performed at [place] [address], [place], [place] [address]    Culture   Final    NO GROWTH 5 DAYS Performed at [facility], [place] [address] [place], [place] [address]    Report Status 07/30/2022 FINAL  Final  Resp panel by RT-PCR (RSV, Flu A&B, Covid) Anterior Nasal Swab     Status: None   Collection Time: 07/25/22  3:50 PM   Specimen: Anterior Nasal Swab  Result Value Ref Range Status   SARS Coronavirus 2 by RT PCR NEGATIVE NEGATIVE Final    Comment: (NOTE) SARS-CoV-2 target nucleic acids are NOT DETECTED.  The SARS-CoV-2 RNA is generally detectable in upper respiratory specimens during the acute phase of infection. The lowest concentration of SARS-CoV-2 viral copies this assay can detect is 138 copies/mL. A negative result does not preclude SARS-Cov-2 infection and should not be used as the sole basis for treatment or other patient management decisions. A negative result may occur with  improper specimen collection/handling, submission of specimen other than nasopharyngeal swab, presence of viral mutation(s) within the areas targeted by this assay, and inadequate number of viral copies(<138 copies/mL). A negative result must be combined with clinical observations, patient history, and  epidemiological information. The expected result is Negative.  Fact Sheet for Patients:  EntrepreneurPulse.com.au  Fact Sheet for Healthcare Providers:  IncredibleEmployment.be  This test is no t yet approved or cleared by the [place] FDA and  has been authorized for detection and/or diagnosis of SARS-CoV-2 by FDA under an Emergency Use Authorization (EUA). This EUA will remain  in effect (meaning this test can be used) for the duration of the COVID-19 declaration under Section 564(b)(1) of the Act, 21 U.S.C.section 360bbb-3(b)(1), unless the authorization is terminated  or revoked sooner.       Influenza A by PCR NEGATIVE NEGATIVE Final   Influenza B by PCR NEGATIVE NEGATIVE Final    Comment: (NOTE) The Xpert Xpress SARS-CoV-2/FLU/RSV plus assay is intended as an aid in the diagnosis of influenza from Nasopharyngeal swab specimens and should not be used as a sole basis for treatment. Nasal washings and aspirates are unacceptable for Xpert Xpress SARS-CoV-2/FLU/RSV testing.  Fact Sheet for Patients: EntrepreneurPulse.com.au  Fact Sheet for Healthcare Providers: IncredibleEmployment.be  This test is not yet approved or cleared by the [place] FDA and has been authorized for detection and/or diagnosis of SARS-CoV-2 by FDA under an Emergency Use Authorization (EUA). This EUA will remain in effect (meaning this test can be used) for the duration of the COVID-19 declaration under Section 564(b)(1) of the Act, 21 U.S.C. section 360bbb-3(b)(1), unless the authorization is terminated or revoked.     Resp Syncytial  Virus by PCR NEGATIVE NEGATIVE Final    Comment: (NOTE) Fact Sheet for Patients: EntrepreneurPulse.com.au  Fact Sheet for Healthcare Providers: IncredibleEmployment.be  This test is not yet approved or cleared by the [place] FDA and has been  authorized for detection and/or diagnosis of SARS-CoV-2 by FDA under an Emergency Use Authorization (EUA). This EUA will remain in effect (meaning this test can be used) for the duration of the COVID-19 declaration under Section 564(b)(1) of the Act, 21 U.S.C. section 360bbb-3(b)(1), unless the authorization is terminated or revoked.  Performed at [facility], [place] [address] [place], [place] [address]   Blood Culture (routine x 2)     Status: None   Collection Time: 07/25/22 10:02 PM   Specimen: BLOOD  Result Value Ref Range Status   Specimen Description   Final    BLOOD BLOOD LEFT ARM Performed at [place] [address] [place], [place] [address]    Special Requests   Final    BOTTLES DRAWN AEROBIC AND ANAEROBIC Blood Culture adequate volume Performed at [place] [address], [place], [place] [address]    Culture   Final    NO GROWTH 5 DAYS Performed at [facility], [place] [address] [place], [place] [address]    Report Status 07/31/2022 FINAL  Final    Labs: CBC: Recent Labs  Lab 07/30/22 0529 07/31/22 0443 08/01/22 0453 08/02/22 0436  WBC 7.8 6.7 5.4 5.9  NEUTROABS 6.7  --   --   --   HGB 10.6* 10.1* 10.4* 12.0*  HCT 34.0* 31.6* 33.4* 37.8*  MCV 104.6* 100.6* 101.2* 101.3*  PLT 122* 119* 118* 0000000*   Basic Metabolic Panel: Recent Labs  Lab 07/29/22 0500 07/30/22 0658 07/31/22 0443 08/01/22 0453 08/02/22 0436  NA 143 140 137 136 140  K 4.4 4.4 4.2 3.9 4.1  CL 109 100 101 100 100  CO2 24 23 27 28 29  $ GLUCOSE 72 141* 145* 152* 144*  BUN 25* 21 21 20 17  $ CREATININE 1.49* 1.54* 1.51* 1.47* 1.47*  CALCIUM 8.7* 8.4* 8.7* 8.8* 9.1  PHOS 3.0  --   --   --   --    Liver Function Tests: Recent Labs  Lab 07/29/22 0500 07/31/22 0443 08/01/22 0453 08/02/22 0436  AST  --  20 26 28  $ ALT  --  21 22 24  $ ALKPHOS  --  74 88 92  BILITOT  --  0.7 0.7 0.7  PROT  --  5.7* 6.1* 6.4*   ALBUMIN 2.4* 2.3* 2.4* 2.5*   CBG: Recent Labs  Lab 08/03/22 1137 08/03/22 1555 08/03/22 1949 08/04/22 0417 08/04/22 0727  GLUCAP 159* 133* 146* 111* 119*    Discharge time spent: 35 minutes  Signed: Hosie Poisson, MD [facility] 08/04/2022

## 2022-08-04 NOTE — TOC Progression Note (Signed)
Transition of Care [facility]) - Progression Note    Patient Details  Name: [name] [name] MRN: [id number] Date of Birth: 1932/02/04  Transition of Care [facility]) CM/SW Contact  Ayaan Ringle, Juliann Pulse, RN Phone Number: 08/04/2022, 10:37 AM  Clinical Narrative: Awaiting Residential hospice assessment for acceptance.      Expected Discharge Plan: Skilled Nursing Facility Barriers to Discharge: Continued Medical Work up  Expected Discharge Plan and Services         Expected Discharge Date: 08/04/22                                     Social Determinants of Health (SDOH) Interventions SDOH Screenings   Food Insecurity: No Food Insecurity (07/26/2022)  Housing: Low Risk  (07/25/2022)  Transportation Needs: Unmet Transportation Needs (07/25/2022)  Utilities: Not At Risk (07/25/2022)  Depression (PHQ2-9): Low Risk  (06/29/2018)  Tobacco Use: Medium Risk (07/26/2022)    Readmission Risk Interventions     No data to display

## 2022-08-04 NOTE — Progress Notes (Signed)
OT Cancellation Note  Patient Details Name: [name] [name] MRN: [id number] DOB: [birth year]   Cancelled Treatment:    Reason Eval/Treat Not Completed: Other (comment). Patient is has been made comfort care. OT will sign off.  Nekhi Liwanag L Tamey Wanek 08/04/2022, 8:04 AM

## 2022-08-04 NOTE — Progress Notes (Signed)
[facility]) Hospital Liaison Note  Received request for family interest in [facility] yesterday afternoon. Spoke patient's son at bedside and family via telephone to confirm interest and explain services and answer questions today. Eligibility confirmed per [facility] MD. Family is agreeable to transfer today.   Hospital staff aware.  RN, please call report to [phone] prior to patient leaving the unit. Please send signed and completed DNR with patient at discharge.   Thank you,  Zigmund Gottron RN  [facility] Liaison [phone]

## 2022-08-04 NOTE — TOC Transition Note (Signed)
Transition of Care [facility]) - CM/SW Discharge Note   Patient Details  Name: [name] [name] MRN: [id number] Date of Birth: 08-29-1931  Transition of Care [facility]) CM/SW Contact:  Dessa Phi, RN Phone Number: 08/04/2022, 3:47 PM   Clinical Narrative: [name] accepted rep [name] following. PTAR scheduled 8p pick up. Nsg report tel#[phone]. No further CM needs.      Final next level of care: [place] Barriers to Discharge: No Barriers Identified   Patient Goals and CMS Choice      Discharge Placement                         Discharge Plan and Services Additional resources added to the After Visit Summary for                                       Social Determinants of Health (SDOH) Interventions SDOH Screenings   Food Insecurity: No Food Insecurity (07/26/2022)  Housing: Low Risk  (07/25/2022)  Transportation Needs: Unmet Transportation Needs (07/25/2022)  Utilities: Not At Risk (07/25/2022)  Depression (PHQ2-9): Low Risk  (06/29/2018)  Tobacco Use: Medium Risk (07/26/2022)     Readmission Risk Interventions     No data to display

## 2022-08-13 DEATH — deceased

## 2022-08-26 ENCOUNTER — Ambulatory Visit: Payer: Medicare Other | Admitting: Psychiatry

## 2022-08-31 ENCOUNTER — Ambulatory Visit: Payer: Medicare Other | Admitting: Psychiatry

## 2022-09-06 ENCOUNTER — Ambulatory Visit: Payer: Medicare Other | Admitting: Psychiatry
